# Patient Record
Sex: Male | Born: 1965
Health system: Southern US, Community
[De-identification: ages and names within clinical notes are randomized; demographics above are authoritative.]

## PROBLEM LIST (undated history)

## (undated) DIAGNOSIS — E785 Hyperlipidemia, unspecified: Secondary | ICD-10-CM

## (undated) DIAGNOSIS — R945 Abnormal results of liver function studies: Secondary | ICD-10-CM

## (undated) DIAGNOSIS — E119 Type 2 diabetes mellitus without complications: Secondary | ICD-10-CM

## (undated) DIAGNOSIS — I10 Essential (primary) hypertension: Secondary | ICD-10-CM

## (undated) HISTORY — DX: Abnormal results of liver function studies: R94.5

## (undated) HISTORY — DX: Type 2 diabetes mellitus without complications: E11.9

## (undated) HISTORY — DX: Hyperlipidemia, unspecified: E78.5

## (undated) HISTORY — DX: Essential (primary) hypertension: I10

---

## 2000-08-25 ENCOUNTER — Encounter: Admission: RE | Admit: 2000-08-25 | Discharge: 2000-08-25 | Payer: Self-pay | Admitting: Family Medicine

## 2000-08-30 ENCOUNTER — Encounter: Admission: RE | Admit: 2000-08-30 | Discharge: 2000-11-28 | Payer: Self-pay | Admitting: *Deleted

## 2000-09-09 ENCOUNTER — Encounter: Admission: RE | Admit: 2000-09-09 | Discharge: 2000-09-09 | Payer: Self-pay | Admitting: Family Medicine

## 2000-09-28 ENCOUNTER — Encounter: Admission: RE | Admit: 2000-09-28 | Discharge: 2000-09-28 | Payer: Self-pay | Admitting: Family Medicine

## 2001-04-18 ENCOUNTER — Encounter: Admission: RE | Admit: 2001-04-18 | Discharge: 2001-04-18 | Payer: Self-pay | Admitting: Family Medicine

## 2001-05-10 ENCOUNTER — Encounter: Admission: RE | Admit: 2001-05-10 | Discharge: 2001-05-10 | Payer: Self-pay | Admitting: Family Medicine

## 2001-06-08 ENCOUNTER — Encounter: Admission: RE | Admit: 2001-06-08 | Discharge: 2001-06-08 | Payer: Self-pay | Admitting: Family Medicine

## 2001-06-29 ENCOUNTER — Encounter: Admission: RE | Admit: 2001-06-29 | Discharge: 2001-06-29 | Payer: Self-pay | Admitting: Family Medicine

## 2001-07-14 ENCOUNTER — Encounter: Admission: RE | Admit: 2001-07-14 | Discharge: 2001-07-14 | Payer: Self-pay | Admitting: Family Medicine

## 2001-08-15 ENCOUNTER — Encounter: Admission: RE | Admit: 2001-08-15 | Discharge: 2001-08-15 | Payer: Self-pay | Admitting: Family Medicine

## 2001-09-20 ENCOUNTER — Encounter: Admission: RE | Admit: 2001-09-20 | Discharge: 2001-09-20 | Payer: Self-pay | Admitting: Family Medicine

## 2002-05-30 ENCOUNTER — Encounter: Admission: RE | Admit: 2002-05-30 | Discharge: 2002-05-30 | Payer: Self-pay | Admitting: Sports Medicine

## 2002-06-16 ENCOUNTER — Encounter: Admission: RE | Admit: 2002-06-16 | Discharge: 2002-06-16 | Payer: Self-pay | Admitting: Family Medicine

## 2002-11-17 ENCOUNTER — Encounter: Admission: RE | Admit: 2002-11-17 | Discharge: 2002-11-17 | Payer: Self-pay | Admitting: Family Medicine

## 2002-12-05 ENCOUNTER — Encounter: Admission: RE | Admit: 2002-12-05 | Discharge: 2002-12-05 | Payer: Self-pay

## 2002-12-07 ENCOUNTER — Encounter: Admission: RE | Admit: 2002-12-07 | Discharge: 2002-12-07 | Payer: Self-pay | Admitting: Family Medicine

## 2003-07-13 ENCOUNTER — Encounter: Admission: RE | Admit: 2003-07-13 | Discharge: 2003-07-13 | Payer: Self-pay | Admitting: Family Medicine

## 2003-11-21 ENCOUNTER — Encounter: Admission: RE | Admit: 2003-11-21 | Discharge: 2003-11-21 | Payer: Self-pay | Admitting: Family Medicine

## 2004-02-14 ENCOUNTER — Ambulatory Visit: Payer: Self-pay | Admitting: Family Medicine

## 2004-03-04 ENCOUNTER — Ambulatory Visit: Payer: Self-pay | Admitting: Family Medicine

## 2004-03-20 ENCOUNTER — Ambulatory Visit: Payer: Self-pay | Admitting: Family Medicine

## 2004-08-06 ENCOUNTER — Ambulatory Visit: Payer: Self-pay | Admitting: Family Medicine

## 2004-11-12 ENCOUNTER — Ambulatory Visit: Payer: Self-pay | Admitting: Family Medicine

## 2005-11-18 ENCOUNTER — Ambulatory Visit: Payer: Self-pay | Admitting: Family Medicine

## 2005-12-03 ENCOUNTER — Ambulatory Visit: Payer: Self-pay | Admitting: Family Medicine

## 2006-07-21 ENCOUNTER — Ambulatory Visit: Payer: Self-pay | Admitting: Family Medicine

## 2006-07-29 DIAGNOSIS — E1165 Type 2 diabetes mellitus with hyperglycemia: Secondary | ICD-10-CM

## 2006-07-29 DIAGNOSIS — F17200 Nicotine dependence, unspecified, uncomplicated: Secondary | ICD-10-CM

## 2006-07-29 DIAGNOSIS — G4700 Insomnia, unspecified: Secondary | ICD-10-CM

## 2006-07-29 DIAGNOSIS — E785 Hyperlipidemia, unspecified: Secondary | ICD-10-CM | POA: Insufficient documentation

## 2006-07-29 DIAGNOSIS — F528 Other sexual dysfunction not due to a substance or known physiological condition: Secondary | ICD-10-CM

## 2006-11-16 ENCOUNTER — Telehealth: Payer: Self-pay | Admitting: *Deleted

## 2006-12-02 ENCOUNTER — Encounter: Payer: Self-pay | Admitting: *Deleted

## 2006-12-24 ENCOUNTER — Telehealth: Payer: Self-pay | Admitting: *Deleted

## 2007-01-03 ENCOUNTER — Ambulatory Visit: Payer: Self-pay | Admitting: Family Medicine

## 2007-01-03 DIAGNOSIS — M549 Dorsalgia, unspecified: Secondary | ICD-10-CM | POA: Insufficient documentation

## 2007-10-31 ENCOUNTER — Emergency Department (HOSPITAL_COMMUNITY): Admission: EM | Admit: 2007-10-31 | Discharge: 2007-10-31 | Payer: Self-pay | Admitting: Emergency Medicine

## 2007-11-02 ENCOUNTER — Ambulatory Visit: Payer: Self-pay | Admitting: Family Medicine

## 2007-11-02 ENCOUNTER — Encounter (INDEPENDENT_AMBULATORY_CARE_PROVIDER_SITE_OTHER): Payer: Self-pay | Admitting: Family Medicine

## 2007-11-02 ENCOUNTER — Telehealth: Payer: Self-pay | Admitting: *Deleted

## 2007-11-03 ENCOUNTER — Encounter (INDEPENDENT_AMBULATORY_CARE_PROVIDER_SITE_OTHER): Payer: Self-pay | Admitting: Family Medicine

## 2007-11-16 ENCOUNTER — Telehealth (INDEPENDENT_AMBULATORY_CARE_PROVIDER_SITE_OTHER): Payer: Self-pay | Admitting: Family Medicine

## 2008-08-08 ENCOUNTER — Telehealth: Payer: Self-pay | Admitting: *Deleted

## 2008-09-18 ENCOUNTER — Telehealth: Payer: Self-pay | Admitting: *Deleted

## 2008-09-18 ENCOUNTER — Telehealth: Payer: Self-pay | Admitting: Family Medicine

## 2008-09-19 ENCOUNTER — Ambulatory Visit (HOSPITAL_COMMUNITY): Admission: RE | Admit: 2008-09-19 | Discharge: 2008-09-19 | Payer: Self-pay | Admitting: Family Medicine

## 2008-09-19 ENCOUNTER — Encounter: Payer: Self-pay | Admitting: Family Medicine

## 2008-09-19 ENCOUNTER — Ambulatory Visit: Payer: Self-pay | Admitting: Family Medicine

## 2008-09-19 DIAGNOSIS — R000 Tachycardia, unspecified: Secondary | ICD-10-CM

## 2008-09-19 LAB — CONVERTED CEMR LAB
Albumin: 5 g/dL (ref 3.5–5.2)
BUN: 15 mg/dL (ref 6–23)
Calcium: 10.1 mg/dL (ref 8.4–10.5)
Chloride: 103 meq/L (ref 96–112)
Direct LDL: 117 mg/dL — ABNORMAL HIGH
Glucose, Bld: 116 mg/dL — ABNORMAL HIGH (ref 70–99)
Hemoglobin: 15.8 g/dL (ref 13.0–17.0)
Hgb A1c MFr Bld: 9.8 %
Potassium: 4.1 meq/L (ref 3.5–5.3)
RBC: 5.08 M/uL (ref 4.22–5.81)

## 2008-09-21 ENCOUNTER — Encounter: Payer: Self-pay | Admitting: Family Medicine

## 2008-10-02 ENCOUNTER — Encounter: Payer: Self-pay | Admitting: Family Medicine

## 2009-03-01 ENCOUNTER — Telehealth: Payer: Self-pay | Admitting: Family Medicine

## 2009-07-03 ENCOUNTER — Ambulatory Visit: Payer: Self-pay | Admitting: Family Medicine

## 2009-07-03 ENCOUNTER — Encounter: Payer: Self-pay | Admitting: Family Medicine

## 2009-07-03 LAB — CONVERTED CEMR LAB: Hgb A1c MFr Bld: 10.5 %

## 2009-07-04 LAB — CONVERTED CEMR LAB
ALT: 16 units/L (ref 0–53)
AST: 18 units/L (ref 0–37)
Creatinine, Ser: 0.77 mg/dL (ref 0.40–1.50)
Direct LDL: 134 mg/dL — ABNORMAL HIGH
Total Bilirubin: 0.4 mg/dL (ref 0.3–1.2)

## 2009-07-11 ENCOUNTER — Ambulatory Visit: Payer: Self-pay | Admitting: Family Medicine

## 2009-07-11 ENCOUNTER — Encounter: Payer: Self-pay | Admitting: Family Medicine

## 2009-07-11 ENCOUNTER — Encounter (INDEPENDENT_AMBULATORY_CARE_PROVIDER_SITE_OTHER): Payer: Self-pay | Admitting: *Deleted

## 2009-07-19 ENCOUNTER — Ambulatory Visit: Payer: Self-pay | Admitting: Family Medicine

## 2009-08-28 ENCOUNTER — Telehealth: Payer: Self-pay | Admitting: Family Medicine

## 2009-08-30 ENCOUNTER — Ambulatory Visit: Payer: Self-pay | Admitting: Family Medicine

## 2009-11-11 ENCOUNTER — Encounter: Payer: Self-pay | Admitting: Family Medicine

## 2010-01-07 ENCOUNTER — Encounter: Payer: Self-pay | Admitting: Family Medicine

## 2010-01-07 ENCOUNTER — Ambulatory Visit: Payer: Self-pay | Admitting: Family Medicine

## 2010-01-07 ENCOUNTER — Ambulatory Visit (HOSPITAL_COMMUNITY): Admission: RE | Admit: 2010-01-07 | Discharge: 2010-01-07 | Payer: Self-pay | Admitting: Family Medicine

## 2010-01-07 DIAGNOSIS — G43009 Migraine without aura, not intractable, without status migrainosus: Secondary | ICD-10-CM | POA: Insufficient documentation

## 2010-01-09 LAB — CONVERTED CEMR LAB
Free T4: 1.23 ng/dL (ref 0.80–1.80)
T3, Free: 2.2 pg/mL — ABNORMAL LOW (ref 2.3–4.2)

## 2010-02-05 ENCOUNTER — Telehealth: Payer: Self-pay | Admitting: Family Medicine

## 2010-02-13 ENCOUNTER — Telehealth: Payer: Self-pay | Admitting: *Deleted

## 2010-03-18 ENCOUNTER — Encounter: Payer: Self-pay | Admitting: Family Medicine

## 2010-03-18 ENCOUNTER — Telehealth: Payer: Self-pay | Admitting: *Deleted

## 2010-03-18 ENCOUNTER — Ambulatory Visit: Payer: Self-pay | Admitting: Family Medicine

## 2010-03-18 DIAGNOSIS — K5289 Other specified noninfective gastroenteritis and colitis: Secondary | ICD-10-CM

## 2010-04-10 ENCOUNTER — Telehealth: Payer: Self-pay | Admitting: Family Medicine

## 2010-04-23 ENCOUNTER — Ambulatory Visit: Payer: Self-pay | Admitting: Family Medicine

## 2010-06-29 LAB — CONVERTED CEMR LAB: Hgb A1c MFr Bld: 11.1 %

## 2010-07-01 NOTE — Assessment & Plan Note (Signed)
Summary: CPE and needs RX for Viarga/ls   Vital Signs:  Patient profile:   45 year old male Height:      72 inches Weight:      175.2 pounds BMI:     23.85 Temp:     98.6 degrees F oral Pulse rate:   92 / minute BP sitting:   164 / 104  (left arm) Cuff size:   regular  Vitals Entered By: Garen Grams LPN (April 23, 2010 3:56 PM) CC: cpe Is Patient Diabetic? Yes Did you bring your meter with you today? No Pain Assessment Patient in pain? no        Primary Provider:  . [facility]  CC:  cpe.  History of Present Illness: Mr. Erven presents for his CPE.  He has no major complaints except that he has a new girlfriend and has been having trouble with erections when they are intimate.   He says his blood sugars run in the low 100's in the mornings, and he is taking his medications as perscribed.   Pt. smokes a few black and mild cigars a day.  He works as an Audiological scientist, which also includes moving appliances, from which he feels he gets plenty of exercise.   Pt. denies having issues with blood pressures in the past, and says it is high today because of road rage.   Preventive Screening-Counseling & Management  Alcohol-Tobacco     Smoking Status: current     Smoking Cessation Counseling: yes     Cigars/week: 3     Tobacco Counseling: to quit use of tobacco products  Current Medications (verified): 1)  Bayer Childrens Aspirin 81 Mg Chew (Aspirin) .... Take 1 Tablet By Mouth Once A Day 2)  Lantus 100 Unit/ml Soln (Insulin Glargine) .... Inject 50 Unit Subcutaneously At Bedtime; Disp Qs For One Month 3)  Accu-Chek Active  Strp (Glucose Blood) .... Use Twice A Day - Once Before Breakfast and Once Before Bed; May Substitute Any Acceptable Brand. Dispense 2 Month Supply 4)  Bd Insulin Syringe Ultrafine 29g X 1/2" 1 Ml Misc (Insulin Syringe-Needle U-100) .... Disp Qs For One Month 5)  Lipitor 40 Mg Tabs (Atorvastatin Calcium) .... One By Mouth Daily For  Cholesterol 6)  Humalog Kwikpen 100 Unit/ml Soln (Insulin Lispro (Human)) .... Use 1 Unit On Insulin For Every 50 Greater Than 300. 7)  Viagra 100 Mg Tabs (Sildenafil Citrate) .... Take 1/2 Tab To One Tab By Mouth X1 Prior To Sexual Activity.  Allergies: No Known Drug Allergies  Family History: HTN, Mother -  half-sister died of a `syndrome` 1 sister - healthy 3 brothers -healthy Father died at 23 from CHF, also had DM2 and HTN.   No family history of DVTs or PEs.   Physical Exam  General:  Well-developed,well-nourished,in no acute distress; alert,appropriate and cooperative throughout examination Head:  Normocephalic and atraumatic without obvious abnormalities. No apparent alopecia or balding. Eyes:  No corneal or conjunctival inflammation noted. EOMI. Perrla. Funduscopic exam benign, without hemorrhages, exudates or papilledema. Vision grossly normal. Nose:  External nasal examination shows no deformity or inflammation. Nasal mucosa are pink and moist without lesions or exudates. Mouth:  Oral mucosa and oropharynx without lesions or exudates.  Teeth in good repair. Neck:  No deformities, masses, or tenderness noted. Lungs:  Normal respiratory effort, chest expands symmetrically. Lungs are clear to auscultation, no crackles or wheezes. Heart:  Normal rate and regular rhythm. S1 and S2 normal without gallop, murmur, click,  rub or other extra sounds. Abdomen:  Bowel sounds positive,abdomen soft and non-tender without masses, organomegaly or hernias noted. Pulses:  R and L carotid,radial,dorsalis pedis and posterior tibial pulses are full and equal bilaterally Extremities:  No clubbing, cyanosis, edema, or deformity noted with normal full range of motion of all joints.     Impression & Recommendations:  Problem # 1:  HEALTH MAINTENANCE EXAM (ICD-V70.0) Pt. still with poorly controlled chronic medical problems as discussed below.  He denies any previous problems with blood pressure,  but it was elevated when he arrived in clinic this afternoon, as well as when it was rechecked just before he left.  I will have him come in for nurse visits for blood pressure checks every two weeks for the next six weeks.    Problem # 2:  TOBACCO DEPENDENCE (ICD-305.1) Pt. currently unwilling to quit smoking, as he feels he does not smoke very much.  counselled that he should seriously consider quitting as he is already at high risk for heart attack and stroke due to diabetes.  Orders: [facility] - Est  40-64 yrs ([address])  Problem # 3:  DIABETES MELLITUS II, UNCOMPLICATED (ICD-250.00) Improved, but still poorly controlled.  Pt. says it has improved because he has been eating healthier because of his girlfriend.  Will receck A1c next visit to determine if medication changes still need to be made.  His updated medication list for this problem includes:    Bayer Childrens Aspirin 81 Mg Chew (Aspirin) .Marland Kitchen... Take 1 tablet by mouth once a day    Lantus 100 Unit/ml Soln (Insulin glargine) ..... Inject 50 unit subcutaneously at bedtime; disp qs for one month    Humalog Kwikpen 100 Unit/ml Soln (Insulin lispro (human)) ..... Use 1 unit on insulin for every 50 greater than 300.  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 has had issues with ED in the past, and Viagra seemed to help him.  Will re-start this medication today.  His updated medication list for this problem includes:    Viagra 100 Mg Tabs (Sildenafil citrate) .Marland Kitchen... Take 1/2 tab to one tab by mouth x1 prior to sexual activity.  Orders: [facility] - Est  40-64 yrs ([address])  Complete Medication List: 1)  Bayer Childrens Aspirin 81 Mg Chew (Aspirin) .... Take 1 tablet by mouth once a day 2)  Lantus 100 Unit/ml Soln (Insulin glargine) .... Inject 50 unit subcutaneously at bedtime; disp qs for one month 3)  Accu-chek Active Strp (Glucose blood) .... Use twice a day - once before breakfast and  once before bed; may substitute any acceptable brand. dispense 2 month supply 4)  Bd Insulin Syringe Ultrafine 29g X 1/2" 1 Ml Misc (Insulin syringe-needle u-100) .... Disp qs for one month 5)  Lipitor 40 Mg Tabs (Atorvastatin calcium) .... One by mouth daily for cholesterol 6)  Humalog Kwikpen 100 Unit/ml Soln (Insulin lispro (human)) .... Use 1 unit on insulin for every 50 greater than 300. 7)  Viagra 100 Mg Tabs (Sildenafil citrate) .... Take 1/2 tab to one tab by mouth x1 prior to sexual activity.  Other Orders: Future Orders: Lipid-[facility] ([id number]) ... 04/24/2011  Patient Instructions: 1)  It was nice to meet you today. 2)  Please continue to watch your diet, exercise, and give your insulin as perscribed.   3)  Please think about quitting smoking- it is one of the best health choices you can make. 4)  Watch your caloric intake over the Holidays. 5)  Please make a lab appointment to have you cholesterol checked, first thing in the morning before you eat anything.  Prescriptions: VIAGRA 100 MG TABS (SILDENAFIL CITRATE) take 1/2 tab to one tab by mouth x1 prior to sexual activity.  #10 x 3   Entered and Authorized by:   Ardyth Gal MD   Signed by:   Ardyth Gal MD on 04/23/2010   Method used:   Print then Give to Patient   RxID:   [id number] VIAGRA 100 MG TABS (SILDENAFIL CITRATE) take 1/2 tab to one tab by mouth x1 prior to sexual activity.  #10 x 3   Entered and Authorized by:   Ardyth Gal MD   Signed by:   Ardyth Gal MD on 04/23/2010   Method used:   Print then Give to Patient   RxID:   [id number]    Orders Added: 1)  A1C-[facility] [83036] 2)  Lipid-[facility] [[id number]] 3)  [facility] - Est  40-64 yrs [[address]396]    Laboratory Results   Blood Tests   Date/Time Received: April 23, 2010 3:50 PM  Date/Time Reported: April 23, 2010 4:24 PM   HGBA1C: 9.7%   (Normal Range: Non-Diabetic - 3-6%   Control Diabetic - 6-8%)   Comments: ...............test performed by......Marland KitchenBonnie A. [place], MLS (ASCP)cm       Prevention & Chronic Care Immunizations   Influenza vaccine: Not documented    Tetanus booster: 06/01/1997: Done.   Tetanus booster due: 06/02/2007    Pneumococcal vaccine: Not documented      Screening comments: Pt. not ready to quit.   Diabetes Mellitus   HgbA1C: 9.7  (04/23/2010)   Hemoglobin A1C due: 02/02/2008    Eye exam: Not documented    Foot exam: Not documented   High risk foot: Not documented   Foot care education: Not documented    Urine microalbumin/creatinine ratio: Not documented    Diabetes flowsheet reviewed?: Yes   Progress toward A1C goal: Improved    Stage of readiness to change (diabetes management): Contemplation  Lipids   Total Cholesterol: Not documented   LDL: Not documented   LDL Direct: 134  (07/03/2009)   HDL: Not documented   Triglycerides: Not documented    SGOT (AST): 18  (07/03/2009)   SGPT (ALT): 16  (07/03/2009)   Alkaline phosphatase: 83  (07/03/2009)   Total bilirubin: 0.4  (07/03/2009)    Lipid flowsheet reviewed?: Yes   Progress toward LDL goal: Unchanged    Stage of readiness to change (lipid management): Contemplation  Self-Management Support :   Personal Goals (by the next clinic visit) :     Personal A1C goal: 8  (07/03/2009)     Personal blood pressure goal: 130/80  (07/03/2009)     Personal LDL goal: 100  (07/03/2009)    Patient will work on the following items until the next clinic visit to reach self-care goals:     Medications and monitoring: take my medicines every day  (04/23/2010)     Eating: drink diet soda or water instead of juice or soda  (04/23/2010)     Activity: take a 30 minute walk every day  (04/23/2010)    Diabetes self-management support: Copy of home glucose meter record  (04/23/2010)    Lipid self-management support: Written self-care plan  (07/03/2009)

## 2010-07-01 NOTE — Miscellaneous (Signed)
Summary: Informed Consent for Medical Procedure  Informed Consent for Medical Procedure   Imported By: Knox Royalty 07/25/2009 08:44:10  _____________________________________________________________________  External Attachment:    Type:   Image     Comment:   External Document

## 2010-07-01 NOTE — Assessment & Plan Note (Signed)
Summary: REMOVE CYST/KH   Vital Signs:  Patient profile:   45 year old male Height:      72 inches Weight:      174.0 pounds BMI:     23.68 Temp:     98.2 degrees F oral Pulse rate:   116 / minute BP sitting:   136 / 84  (right arm) Cuff size:   regular  Vitals Entered By: Gladstone Pih (July 11, 2009 9:16 AM) CC: Cyst removed under chin Is Patient Diabetic? Yes Did you bring your meter with you today? No Pain Assessment Patient in pain? no        Primary Care Provider:  Myrtie Soman  MD  CC:  Cyst removed under chin.  History of Present Illness: lump under chin for several months 94-6). Started as an ingrown hair. No drainage. Hee for removal /further eval  Habits & Providers  Alcohol-Tobacco-Diet     Tobacco Status: current     Tobacco Counseling: to quit use of tobacco products     Other Tobacco cigar     Other per week 14  Allergies: No Known Drug Allergies  Physical Exam  Neck:  large 2.5 cm diameter, mobile, very soft subcutaneous mass under chin in hair bearing area. no drainage. no pain, a little tender. Additional Exam:  Informed consent given and signed copy in chart. Appropraite time out taken. Area prepped and draped in usual sterile fashion. Local anasthesia with 2% lidocaine with epi (8 cc). Small 1 cm incision made horizontally across center of mass.  Blunt and some sharp dissection used to isolate sac of apparent sebacous cyst. The sca was partially ruptured but we were able to remove what appeared to be the entire sac. typical sebacous materila oozed from the puncture site. Incision was closed with two 3-0 vicryl sutures placed deep and 4 interrupted sutures using 4-0 ethilon. Area was cleaned and topical abx ointment applied. No complications. EBL < 5 cc. Given suture care instructions.    Impression & Recommendations:  Problem # 1:  SEBACEOUS CYST, NECK (ICD-706.2)  rtc 1 week for suture removal  Orders: Provider Misc Charge- [facility]  (Misc)  Complete Medication List: 1)  Bayer Childrens Aspirin 81 Mg Chew (Aspirin) .... Take 1 tablet by mouth once a day 2)  Lantus 100 Unit/ml Soln (Insulin glargine) .... Inject 56 unit subcutaneously at bedtime; disp qs for one month 3)  Accu-chek Active Strp (Glucose blood) .... Use twice a day - once before breakfast and once before bed; may substitute any acceptable brand. dispense 2 month supply 4)  Tramadol Hcl 50 Mg Tabs (Tramadol hcl) .Marland Kitchen.. 1-2 tabs by mouth every 4-6 hours as needed 5)  Flexeril 10 Mg Tabs (Cyclobenzaprine hcl) .... One by mouth three times a day as needed for spasm 6)  Bd Insulin Syringe Ultrafine 29g X 1/2" 1 Ml Misc (Insulin syringe-needle u-100) .... Disp qs for one month 7)  Lipitor 40 Mg Tabs (Atorvastatin calcium) .... One by mouth daily for cholesterol

## 2010-07-01 NOTE — Assessment & Plan Note (Signed)
Summary: f/up & leg/back pain,tcb   Vital Signs:  Patient profile:   45 year old male Height:      72 inches Weight:      175.6 pounds BMI:     23.90 Temp:     98.3 degrees F oral Pulse rate:   91 / minute BP sitting:   143 / 94  (left arm) Cuff size:   regular  Vitals Entered By: Garen Grams LPN (July 03, 2009 11:03 AM) CC: f/u Is Patient Diabetic? Yes Did you bring your meter with you today? No Pain Assessment Patient in pain? yes     Location: back Intensity: 3   Primary Care Provider:  Myrtie Soman  MD  CC:  f/u.  History of Present Illness: Pt with poorly controlled diabetes -- has not been seen since 4/10.   1. low back pain Has been going on for about 1 month. Has a dull ache constantly. Worse with certain movements like sustained stooping or lifting objects. Worse in the morning but eventually looses up. Aleve helps - takes the edge off. Takes 2 pills once or twice per day.  Some radiation down the legs into the calves - equal on both sides  ROS: denies any bowel or bladder problems; no groin numbness; no weakness or problems walking. No polyuria or polydipsia  2. ingrown hair on chin has been going on for 3 months. Gets bigger and smaller periodically. Has not been able to pop it, which he usually does. Sometimes gets red and pain. Is big now, but not red or painful.   3. diabetes  Pt does not bring this up as an issue. sugars are very erratic. A1c is 10.5 today. Take 56 units of lantus daily. States that he checks his sugars about 3 times per day. Does not have a log of his sugars. Reports a lot of stress lately, son recently put in jail. Just got divorced from his wife.    Habits & Providers  Alcohol-Tobacco-Diet     Tobacco Status: current  Current Medications (verified): 1)  Bayer Childrens Aspirin 81 Mg Chew (Aspirin) .... Take 1 Tablet By Mouth Once A Day 2)  Lantus 100 Unit/ml Soln (Insulin Glargine) .... Inject 56 Unit Subcutaneously At  Bedtime 3)  Accu-Chek Active  Strp (Glucose Blood) .... Use Twice A Day - Once Before Breakfast and Once Before Bed; May Substitute Any Acceptable Brand. Dispense 2 Month Supply 4)  Tramadol Hcl 50 Mg Tabs (Tramadol Hcl) .Marland Kitchen.. 1-2 Tabs By Mouth Every 4-6 Hours As Needed 5)  Flexeril 10 Mg Tabs (Cyclobenzaprine Hcl) .... One By Mouth Three Times A Day As Needed For Spasm  Allergies (verified): No Known Drug Allergies  Past History:  Past Medical History: noncompliant with diabetes diet HLD diabetes type 2 - poorly controlled tobacco abuse  Family History: HTN, Mother -  half-sister died of a `syndrome` 1 sister - healthy 3 brothers -healthy Father died at 67 from CHF, also had DM2  No family history of DVTs or PEs.   Social History: recently divorced; son Thayer Ohm) recently put in jail (3.5 years)); 3 kids (Levon, 11; [place], 21; Renee, 22).; Works as Audiological scientist; smokes 2-3 cigars daily; occ ETOH; occasional THC use.   Physical Exam  Additional Exam:  General:  Vital signs reviewed -- hypertensive Alert, appropriate; well-dressed and well-nourished Lungs:  work of breathing unlabored, clear to auscultation bilaterally; no wheezes, rales, or ronchi; good air movement throughout Heart:  regular rate and  rhythm, no murmurs; normal s1/s2 MSK: back exam: normal to inspection; no bony point tenderness; soem paraspinous muscle tenderness at around T11; mildly painful lateral flexion, normal forward flexion, normal extension; negative SLR bilaterally. Negative L'hermitte sign. DTRs 1+ bilaterally and symmetric. Sensation normal bilateral legs.   Pulses:  DP and radial pulses 2+ bilaterally  Extremities:  no cyanosis, clubbing, or edema Neurologic:  alert and oriented. speech normal.    Impression & Recommendations:  Problem # 1:  BACK PAIN (ICD-724.5) likely muscle strain related to job. No red flags. Flexeril and tramadol for now. Would avoid further NSAIDs given BP. Heat  and stretching exercises.   The following medications were removed from the medication list:    Flexeril 10 Mg Tabs (Cyclobenzaprine hcl) .Marland Kitchen... Take one tab by mouth at bedtime His updated medication list for this problem includes:    Bayer Childrens Aspirin 81 Mg Chew (Aspirin) .Marland Kitchen... Take 1 tablet by mouth once a day    Tramadol Hcl 50 Mg Tabs (Tramadol hcl) .Marland Kitchen... 1-2 tabs by mouth every 4-6 hours as needed    Flexeril 10 Mg Tabs (Cyclobenzaprine hcl) ..... One by mouth three times a day as needed for spasm  Problem # 2:  SEBACEOUS CYST, NECK (ICD-706.2) Assessment: New Not inflamed. Have back in procedures clinic for removal.   Problem # 3:  DIABETES MELLITUS II, UNCOMPLICATED (ICD-250.00) Assessment: Deteriorated  A1c worse. Stressed the importance of taking this seriously. Have back soon in pharmacy clinic to discuss further. Pt open to starting mealtime insulin. Asked him to keep a log of his sugars for review at pharmacy clinic. Check CMP and LDL today. Would benefit from ACEi.   The following medications were removed from the medication list:    Humalog Kwikpen 100 Unit/ml Soln (Insulin lispro (human)) .Marland Kitchen... Take as directed His updated medication list for this problem includes:    Bayer Childrens Aspirin 81 Mg Chew (Aspirin) .Marland Kitchen... Take 1 tablet by mouth once a day    Lantus 100 Unit/ml Soln (Insulin glargine) ..... Inject 56 unit subcutaneously at bedtime  Orders: A1C-[facility] ([address]109) Comp Met-[facility] ([id number]) Direct LDL-[facility] (91478-29562)  Complete Medication List: 1)  Bayer Childrens Aspirin 81 Mg Chew (Aspirin) .... Take 1 tablet by mouth once a day 2)  Lantus 100 Unit/ml Soln (Insulin glargine) .... Inject 56 unit subcutaneously at bedtime 3)  Accu-chek Active Strp (Glucose blood) .... Use twice a day - once before breakfast and once before bed; may substitute any acceptable brand. dispense 2 month supply 4)  Tramadol Hcl 50 Mg Tabs (Tramadol hcl) .Marland Kitchen.. 1-2 tabs by mouth every  4-6 hours as needed 5)  Flexeril 10 Mg Tabs (Cyclobenzaprine hcl) .... One by mouth three times a day as needed for spasm  Patient Instructions: 1)  follow-up in our procedures clinic to have the cyst removed from your chin. 2)  follow-up in our diabetes pharmacy clinic in the next month 3)  follow-up with me in 2-3 months 4)  use heat 2-3 times a day for 20 minutes 5)  take the flexeril three times a day as needed - it may make you sleepy. 6)  use the tramadol every 4-6 hours 7)  do the back exercises 8)  we need to get serious about your blood sugars 9)  Start keeping a log of sugars.  Prescriptions: FLEXERIL 10 MG TABS (CYCLOBENZAPRINE HCL) one by mouth three times a day as needed for spasm  #60 x 0   Entered and Authorized by:  Myrtie Soman  MD   Signed by:   Myrtie Soman  MD on 07/03/2009   Method used:   Electronically to        [facility] #[id number]* (retail)       [address] [place], [place]  [address]       Ph: [phone]       Fax: [phone]   RxID:   [id number] TRAMADOL HCL 50 MG TABS (TRAMADOL HCL) 1-2 tabs by mouth every 4-6 hours as needed  #60 x 1   Entered and Authorized by:   Myrtie Soman  MD   Signed by:   Myrtie Soman  MD on 07/03/2009   Method used:   Electronically to        [facility] #[id number]* (retail)       [address] [place], [place]  [address]       Ph: [phone]       Fax: [phone]   RxID:   [id number]   Laboratory Results   Blood Tests   Date/Time Received: July 03, 2009 11:04 AM  Date/Time Reported: July 03, 2009 11:24 AM   HGBA1C: 10.5%   (Normal Range: Non-Diabetic - 3-6%   Control Diabetic - 6-8%)  Comments: ...............test performed by......Marland KitchenBonnie A. [place], MLS (ASCP)cm      Prevention & Chronic Care Immunizations   Influenza vaccine: Not documented    Tetanus booster: 06/01/1997: Done.   Tetanus booster due: 06/02/2007    Pneumococcal vaccine: Not  documented  Other Screening   PSA: Not documented   Smoking status: current  (07/03/2009)   Smoking cessation counseling: yes  (11/02/2007)  Diabetes Mellitus   HgbA1C: 10.5  (07/03/2009)   Hemoglobin A1C due: 02/02/2008    Eye exam: Not documented    Foot exam: Not documented   High risk foot: Not documented   Foot care education: Not documented    Urine microalbumin/creatinine ratio: Not documented  Lipids   Total Cholesterol: Not documented   LDL: Not documented   LDL Direct: 117  (09/19/2008)   HDL: Not documented   Triglycerides: Not documented    SGOT (AST): 19  (09/19/2008)   SGPT (ALT): 18  (09/19/2008) CMP ordered    Alkaline phosphatase: 69  (09/19/2008)   Total bilirubin: 0.7  (09/19/2008)  Self-Management Support :   Personal Goals (by the next clinic visit) :     Personal A1C goal: 8  (07/03/2009)     Personal blood pressure goal: 130/80  (07/03/2009)     Personal LDL goal: 100  (07/03/2009)    Diabetes self-management support: Written self-care plan  (07/03/2009)   Diabetes care plan printed    Lipid self-management support: Written self-care plan  (07/03/2009)   Lipid self-care plan printed.  Appended Document: f/up & leg/back pain,tcb    Clinical Lists Changes  Medications: Added new medication of BD INSULIN SYRINGE ULTRAFINE 29G X 1/2" 1 ML MISC (INSULIN SYRINGE-NEEDLE U-100) Disp QS for one month - Signed Changed medication from LANTUS 100 UNIT/ML SOLN (INSULIN GLARGINE) Inject 56 unit subcutaneously at bedtime to LANTUS 100 UNIT/ML SOLN (INSULIN GLARGINE) Inject 56 unit subcutaneously at bedtime; disp QS for one month - Signed Rx of BD INSULIN SYRINGE ULTRAFINE 29G X 1/2" 1 ML MISC (INSULIN SYRINGE-NEEDLE U-100) Disp QS for one month;  #1 x 11;  Signed;  Entered by: Myrtie Soman  MD;  Authorized  by: Myrtie Soman  MD;  Method used: Electronically to [facility] #[id number]*, [address] [place], [place]  [address], Ph: [phone],  Fax: [phone] Rx of LANTUS 100 UNIT/ML SOLN (INSULIN GLARGINE) Inject 56 unit subcutaneously at bedtime; disp QS for one month;  #1 x 3;  Signed;  Entered by: Myrtie Soman  MD;  Authorized by: Myrtie Soman  MD;  Method used: Electronically to [facility] #[id number]*, [address] [place], [place]  [address], Ph: [phone], Fax: [phone]    Prescriptions: LANTUS 100 UNIT/ML SOLN (INSULIN GLARGINE) Inject 56 unit subcutaneously at bedtime; disp QS for one month  #1 x 3   Entered and Authorized by:   Myrtie Soman  MD   Signed by:   Myrtie Soman  MD on 07/03/2009   Method used:   Electronically to        [facility] #[id number]* (retail)       [address] [place], [place]  [address]       Ph: [phone]       Fax: [phone]   RxID:   [id number] BD INSULIN SYRINGE ULTRAFINE 29G X 1/2" 1 ML MISC (INSULIN SYRINGE-NEEDLE U-100) Disp QS for one month  #1 x 11   Entered and Authorized by:   Myrtie Soman  MD   Signed by:   Myrtie Soman  MD on 07/03/2009   Method used:   Electronically to        [facility] #[id number]* (retail)       [address] [place], [place]  [address]       Ph: [phone]       Fax: [phone]   RxID:   [id number]    Appended Document: Orders Update    Clinical Lists Changes  Orders: Added new Test order of [facility]- Est  Level 4 ([address]) - Signed

## 2010-07-01 NOTE — Assessment & Plan Note (Signed)
Summary: migraine,tcb   Vital Signs:  Patient profile:   45 year old male Weight:      158.2 pounds Temp:     98.6 degrees F Pulse rate:   128 / minute Pulse rhythm:   regular BP sitting:   135 / 92  (left arm) Cuff size:   regular  Vitals Entered By: Loralee Pacas CMA (January 07, 2010 2:28 PM) CC: migraine   Primary Care Provider:  . [facility]  CC:  migraine.  History of Present Illness: Migraine:  Patient developed headache starting this Sunday.  Describes as beginning in occipital region, then radiating to left parietal and frontal areas.  Had very stressful day at work on Friday, worst day he can remember.  Developed nausea and vomited x 1 later that Sunday, continued with N/V on Monday along with blurry vision.  Has tried Alleve and Tramadol without relief.  Today, states that he is only experiencing the "ghost" of his pain, much improved.  Still with some nausea, no vomiting.  Little appetite.  ROS:  15 to 20 lb weight loss for past 3-4 months, night sweats.  No muscle aches, fevers, chills, cough, shortness of breath, or chest pain.  No polydipsia, polyuria, polyphagia.  Does report CBGs at home being higher than usual, around 200-300 even with him not eating.    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 Take 1 Tablet By Mouth Once A Day 2)  Lantus 100 Unit/ml Soln (Insulin Glargine) .... Inject 58 Unit Subcutaneously At Bedtime; Disp Qs For One Month 3)  Accu-Chek Active  Strp  (Glucose Blood) .... Use Twice A Day - Once Before Breakfast and Once Before Bed; May Substitute Any Acceptable Brand. Dispense 2 Month Supply 4)  Bd Insulin Syringe Ultrafine 29g X 1/2" 1 Ml Misc (Insulin Syringe-Needle U-100) .... Disp Qs For One Month 5)  Lipitor 40 Mg Tabs (Atorvastatin Calcium) .... One By Mouth Daily For Cholesterol 6)  Humalog Kwikpen 100 Unit/ml Soln (Insulin Lispro (Human)) .... Use 1 Unit On Insulin For Every 50 Greater Than 300.  Allergies (verified): No Known Drug Allergies  Past History:  Past medical, surgical, family and social histories (including risk factors) reviewed, and no changes noted (except as noted below).  Past Medical History: Reviewed history from 07/03/2009 and no changes required. noncompliant with diabetes diet HLD diabetes type 2 - poorly controlled tobacco abuse  Past Surgical History: Reviewed history from 07/29/2006 and no changes required. CMP wnl (cr=1.0) except glu 138 - 11/13/2004, TSH 1.045, lipase <10 - 11/13/2004  Family History: Reviewed history from 07/03/2009 and no changes required. HTN, Mother -  half-sister died of a `syndrome` 1 sister - healthy 3 brothers -healthy Father died at 46 from CHF, also had DM2  No family history of DVTs or PEs.   Social History: Reviewed history from 07/03/2009 and no changes required. recently divorced; son Thayer Ohm) recently put in jail (3.5 years)); 3 kids (Levon, 29; [place], [place]; Renee, 22).; Works as Audiological scientist;  smokes 2-3 cigars daily; occ ETOH; occasional THC use.   Physical Exam  General:  alert, well-developed, well-nourished, and well-hydrated.  maildly underweight for his height. Vital signs reviewed, see A/P regarding pulse.   Eyes:  some proptosis noted bilaterally and equal both eyes Mouth:  mucus membranes well-hydrated. does have small "defect" in soft palate, no erythema Lungs:  clear to auscultation bilaterally without wheezing, rales, or rhonchi.  Normal  work of breathing  Heart:  Tachycardic, regular rhythm without murmur, rub, or gallop.  Normal S1/S2  Abdomen:  soft/nondistended/nontender.  Bowel sounds present and normoactive.  No organomegaly noted.   Pulses:  rapid regular pulse   Impression & Recommendations:  Problem # 1:  COMMON MIGRAINE (ICD-346.10) Patient does not have history of migraines.  States he may have had one when he was a child but none since then.  Possibility that this is new-onset of migraine disorder.  Will not treat for abortive or prophylactic therapy as this is first occurence and patient may not have any further migraines.  Other possibilties are that he is suffering from hyperthyroidism, see below, though he complains of anorexia rather than increasing hunger.  Elevated blood sugars could also be the cause of his headache, however this could also be an effect of stress brought on by headache.  Plan to call patient with TSH results tomorrow, will develop follow-up plan based on those results.  Discussed Phenergan with him, but he states he thinks he'll be okay because he now feels hungry and that nausea is resolving.  Will not send prescription now, wait for phone call tomorrow.   His updated medication list for this problem includes:    Bayer Childrens Aspirin 81 Mg Chew (Aspirin) .Marland Kitchen... Take 1 tablet by mouth once a day  Orders: [facility]- Est Level  3 (32951)  Problem # 2:  UNSPECIFIED TACHYCARDIA (ICD-785.0) Assessment: New Obtained EKG, which showed sinus tachycardia.  Also some nonspecific changes of possible bilateral atrial enlargement.  Patient will need follow up EKG once tachycardia has resolved.  Of note, patient admits to drinking Monster energy drink 10 minutes before coming back for appointment.  Most likely reason for acute tachycardia is energy drink consumption as well as mild dehydration.  Will check TSH, Free T3, Free T4 due to symptoms of weight loss, occasional night sweats, and sign of proptosis.  Of  note, patient has been diagnosed with uspecified tachycardia in the past.   Orders: TSH-[facility] [phone]) Free T3-[facility] [phone]) Free T4-[facility] [phone])  Complete Medication List: 1)  Bayer Childrens Aspirin 81 Mg Chew (Aspirin) .... Take 1 tablet by mouth once a day 2)  Lantus 100 Unit/ml Soln (Insulin glargine) .... Inject 58 unit subcutaneously at bedtime; disp qs for one month 3)  Accu-chek Active Strp (Glucose blood) .... Use twice a day - once before breakfast and once before bed; may substitute any acceptable brand. dispense 2 month supply 4)  Bd Insulin Syringe Ultrafine 29g X 1/2" 1 Ml Misc (Insulin syringe-needle u-100) .... Disp qs for one month 5)  Lipitor 40 Mg Tabs (Atorvastatin calcium) .... One by mouth daily for cholesterol 6)  Humalog Kwikpen 100 Unit/ml Soln (Insulin lispro (human)) .... Use 1 unit on insulin for every 50 greater than 300.  Other Orders: A1C-[facility] (70623) Glucose Cap-[facility] [phone])  Laboratory Results   Blood Tests   Date/Time Received: January 07, 2010 2:29 PM  Date/Time Reported: January 07, 2010 3:49 PM   HGBA1C: 11.1%   (Normal Range:  Non-Diabetic - 3-6%   Control Diabetic - 6-8%)  Comments: ...............test performed by......Marland KitchenBonnie A. [place], MLS (ASCP)cm

## 2010-07-01 NOTE — Progress Notes (Signed)
  Phone Note Call from Patient   Caller: Patient Summary of Call: Pt didn't take dm meds due to b/s running low yesterday.  This am was 48.  Taken a few minutes ago and it was at 45.  Question whether his pancreas is functioning properly.  Haven't taken insulin and is concerned that blood sugar is still at lower level than normal.  Please call [phone] Initial call taken by: Britta Mccreedy mcgregor  Follow-up for Phone Call        he just drank OJ & is feeling better now. took no insulin last night and it "bottoms out in the middle of the night"  does not eat breakfast. had a pastry today. likes meat but recall was deficient in protien. snacks "all day"  tried to get him to make an appt. states he does not have his schedule. says he will call back to make it. has a roommate. explained how dangerous drops in CBGs are. urged him to pack a lunch & snacks for his day. 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  he also wanted to know if the body builds up extra insulin. told him no. explained how with foods he can have a fairly stable blood sugar & avoid the peaks & valleys that feels so bad & can be life threatening. gave examples Follow-up by: Golden Circle RN,  February 05, 2010 11:37 AM  Additional Follow-up for Phone Call Additional follow up Details #1::        called pt told him to keep food at bedside at ngiht have peanut butter before going to bed.  Pt denies fever, chills, nausea, vomiting, diarrhea or constipation but does state he has been drinking a little more alcohol recently, told him that could be contributing and he really should be seen this week or next week at the latest, pt stated he will call to make appointment.  Additional Follow-up by: Antoine Primas DO,  February 05, 2010 12:05 PM

## 2010-07-01 NOTE — Assessment & Plan Note (Signed)
Summary: vomiting & diarrhea   Vital Signs:  Patient profile:   45 year old male Height:      72 inches Weight:      172 pounds BMI:     23.41 Temp:     98.5 degrees F oral Pulse rate:   106 / minute BP sitting:   132 / 87  (left arm) Cuff size:   large  Vitals Entered By: Jimmy Footman, CMA (March 18, 2010 3:57 PM) CC: n/v x1 day Is Patient Diabetic? Yes Did you bring your meter with you today? No   Primary Aaradhya Kysar:  . [facility]  CC:  n/v x1 day.  History of Present Illness: Pt. here with NB/NB emesis x1day along with diarrhea.  States he began having vomiting last pm after eating chicken salad sandwich because his blood sugar felt low.  States he had not been able to hold any food down this morning.  Also with multiple episodes of non-bloody diarrhea this morning.  Has had associated abdominal crampings with this, mainly in lower abdomen.  No fevers at home, has been checking blood sugars and adjusting insulin as needed since he hasn't been eating that much.  At this time he does not feel nausesous and has not had diarrhea x4-5 hours.  Pt. requests note for work and note to be exempt from jury duty since he has to administer himself insulin and check his CBG so often.  Habits & Providers  Alcohol-Tobacco-Diet     Tobacco Status: current     Cigarette Packs/Day: <0.25  Allergies: No Known Drug Allergies  Social History: Packs/Day:  <0.25  Review of Systems       Pertinent positives and negatives noted in HPI, Vitals signs noted   Physical Exam  General:  Well-developed,well-nourished,in no acute distress; alert,appropriate and cooperative throughout examination Head:  Normocephalic and atraumatic without obvious abnormalities. No apparent alopecia or balding. Eyes:  No corneal or conjunctival inflammation noted. EOMI. Perrla.  Vision grossly normal. Mouth:  Oral mucosa and oropharynx without lesions or exudates.  Teeth in good repair. Lungs:  Normal respiratory  effort, chest expands symmetrically. Lungs are clear to auscultation, no crackles or wheezes. Heart:  Normal rate and regular rhythm. S1 and S2 normal without gallop, murmur, click, rub or other extra sounds. Abdomen:  Mild tenderness diffusely, BS normoactive, no rebound or guarding Msk:  No deformity or scoliosis noted of thoracic or lumbar spine.     Impression & Recommendations:  Problem # 1:  GASTROENTERITIS (ICD-558.9) Likely stroenteritis, unsure if chicken salad contributed to this as it was new and not expired.  Patient with resolved symptoms at this point.  Advised on red flags to look for at home, and to stay well hydrated with symptomatic treatment.  Will write note for work and jury duty. Orders: [facility]- Est Level  3 ([address])  Complete Medication List: 1)  Bayer Childrens Aspirin 81 Mg Chew (Aspirin) .... Take 1 tablet by mouth once a day 2)  Lantus 100 Unit/ml Soln (Insulin glargine) .... Inject 58 unit subcutaneously at bedtime; disp qs for one month 3)  Accu-chek Active Strp (Glucose blood) .... Use twice a day - once before breakfast and once before bed; may substitute any acceptable brand. dispense 2 month supply 4)  Bd Insulin Syringe Ultrafine 29g X 1/2" 1 Ml Misc (Insulin syringe-needle u-100) .... Disp qs for one month 5)  Lipitor 40 Mg Tabs (Atorvastatin calcium) .... One by mouth daily for cholesterol 6)  Humalog  Kwikpen 100 Unit/ml Soln (Insulin lispro (human)) .... Use 1 unit on insulin for every 50 greater than 300.  Patient Instructions: 1)  Nice meeting you today. 2)  If you have increased nausea or vomiting, blood in your stool, develop fever go to the closest emergency department. 3)  Try to drink as much water as possible to stay hydrated   Orders Added: 1)  [facility]- Est Level  3 [04540]

## 2010-07-01 NOTE — Letter (Signed)
Summary: Generic Letter  [facility]  [address]   [place], [place] [address]   Phone: [phone]  Fax: [phone]    03/18/2010  ARLIN SASS [address] [place], [place]  [address]  To Whom it May Concern  Please excuse Jerrell Mangel from [employer] [employer] for medical reasons.  He is diabetic and his blood sugars are difficult to control and he has to check them frequently which may make it non-conducive to being on a jury.    Sincerely,   Everrett Coombe DO

## 2010-07-01 NOTE — Letter (Signed)
Summary: Out of Work  [facility]  [address]   [place], [place] [address]   Phone: [phone]  Fax: [phone]    July 11, 2009   Employee:  Cody Campbell    To Whom It May Concern:   For Medical reasons, please excuse the above named employee from work for the following dates:  Start:   Feburary 10, 2011  End:   Feburary 14, 2011  If you need additional information, please feel free to contact our office.         Sincerely,    Gladstone Pih

## 2010-07-01 NOTE — Progress Notes (Signed)
Summary: refill  Phone Note Refill Request Call back at Home Phone [phone] Message from:  Patient  pt is requesting refill on his Viagra [facility] [address]   Initial call taken by: De Nurse,  April 10, 2010 11:46 AM  Follow-up for Phone Call        Please send any team refill requests to the RN who should give a two week refill if apprt and then the patient needs to make an appointment Thanks! Follow-up by: Pearlean Brownie MD,  April 10, 2010 12:10 PM  Additional Follow-up for Phone Call Additional follow up Details #1::        spoke with patient and he had RX for Sheliah Plane last in Feb 2011. advised patient that it is not on his current med list and will not be able to refill at this time.  . he will need appointment for CPE  and to refill med . appointment scheduled 04/23/2010. patient in agreement.  Additional Follow-up by: Theresia Lo RN,  April 10, 2010 12:36 PM

## 2010-07-01 NOTE — Letter (Signed)
Summary: Generic Letter  [facility]  [address]   [place], [place] [address]   Phone: [phone]  Fax: [phone]    03/18/2010  [name] [address] [place], [place]  [address]  To Whom it May Concern,  Please excuse Cody Campbell from work Tuesday March 18, 2010 for medical reasons.  He has been under my care.    Sincerely,   Everrett Coombe DO

## 2010-07-01 NOTE — Miscellaneous (Signed)
Summary: chart update  Clinical Lists Changes  Problems: Removed problem of GASTROENTERITIS, VIRAL, ACUTE (ICD-008.8) Removed problem of ENCOUNTER FOR REMOVAL OF SUTURES (ICD-V58.32) Removed problem of SEBACEOUS CYST, NECK (ICD-706.2) Removed problem of SHORTNESS OF BREATH (ICD-786.05) Removed problem of UNSPECIFIED TACHYCARDIA (ICD-785.0) Medications: Removed medication of TRAMADOL HCL 50 MG TABS (TRAMADOL HCL) 1-2 tabs by mouth every 4-6 hours as needed Removed medication of FLEXERIL 10 MG TABS (CYCLOBENZAPRINE HCL) one by mouth three times a day as needed for spasm

## 2010-07-01 NOTE — Letter (Signed)
Summary: Generic Letter  [facility]  [address]   [place], [place] [address]   Phone: [phone]  Fax: [phone]    01/07/2010  JOHNTAY DOOLEN [address] [place], [place]  [address]  To Whom it May Concern:  Please excuse Cody Campbell from work Monday August 8 to Wednesday August 10 for medical reasons.  He has been under my care.     Sincerely,   Renold Don MD

## 2010-07-01 NOTE — Progress Notes (Signed)
Summary: triage  Phone Note Call from Patient Call back at Home Phone [phone]   Caller: Patient Summary of Call: Pt throwing up feeling real weak.  Asking to be seen this morning. Initial call taken by: Clydell Hakim,  August 28, 2009 9:09 AM  Follow-up for Phone Call        started this am. has vomited 4 times this am. no diarrhea. states he breaks out in a sweat. 340 cbg this am. states the numbers vary according to what he ate the night before. asked him to come in now. stated he would Follow-up by: Golden Circle RN,  August 28, 2009 9:20 AM

## 2010-07-01 NOTE — Progress Notes (Signed)
Summary: refill  Phone Note Refill Request Call back at Home Phone [phone] Message from:  Patient  needles for the ultrafine pen also for one touch ultra test strips & meter [facility] [address]  Initial call taken by: De Nurse,  February 13, 2010 3:19 PM  Follow-up for Phone Call        I called them in but NOT a new meter. message was not clear. pharmacy to call back if any problems Follow-up by: Golden Circle RN,  February 13, 2010 3:30 PM     Appended Document: refill the pharmacy did call back. he [facility] per message. I called pt & his insurance will not pay for that one. needs to have the one touch. ordered new meter & strips  3 times a day testing with 11 refills

## 2010-07-01 NOTE — Assessment & Plan Note (Signed)
Summary: stomach virus,tcb   Vital Signs:  Patient profile:   44 year old male Height:      72 inches Weight:      167 pounds BMI:     22.73 BSA:     1.97 Temp:     98.7 degrees F Pulse rate:   120 / minute BP sitting:   110 / 80  Vitals Entered By: Jone Baseman CMA (August 30, 2009 3:02 PM) CC: stomach virus x 3 days Is Patient Diabetic? Yes Did you bring your meter with you today? No Pain Assessment Patient in pain? no        Primary Care Provider:  Myrtie Soman  MD  CC:  stomach virus x 3 days.  History of Present Illness: 45 yo male with hx of DM type 2 with recent stomach virus.  Pt was having vomiting about 4 times a day for 2 days yesterday was able to tolerate solid foods. Pt is feeling better, no fever, concern of why he seems to keep getting ill. Pt denies any fevers and did not have diarrhea or any systemic symptoms.  Pt states that he had a hole in the roof of his mouth from the stomach acid but it is now healing well.   Pt blood sugar was very elevated to 300-400 while sick now is doing a little better.  Pt though wuld like to have a humalog pen on hand because when he gets sick and his blood sugar is high lantus takes too long to react.  Pt has done a sliding scale before and understands the concept.  Pt also needs needles for his lantus.  Pt is taking 56 untis daily  Pt is smoking still, does not want to quit.  smoke 3 cigars a day.   Habits & Providers  Alcohol-Tobacco-Diet     Tobacco Status: current     Tobacco Counseling: to quit use of tobacco products     Other Tobacco cigar     Other per week 21  Current Medications (verified): 1)  Bayer Childrens Aspirin 81 Mg Chew (Aspirin) .... Take 1 Tablet By Mouth Once A Day 2)  Lantus 100 Unit/ml Soln (Insulin Glargine) .... Inject 58 Unit Subcutaneously At Bedtime; Disp Qs For One Month 3)  Accu-Chek Active  Strp (Glucose Blood) .... Use Twice A Day - Once Before Breakfast and Once Before Bed; May  Substitute Any Acceptable Brand. Dispense 2 Month Supply 4)  Tramadol Hcl 50 Mg Tabs (Tramadol Hcl) .Marland Kitchen.. 1-2 Tabs By Mouth Every 4-6 Hours As Needed 5)  Flexeril 10 Mg Tabs (Cyclobenzaprine Hcl) .... One By Mouth Three Times A Day As Needed For Spasm 6)  Bd Insulin Syringe Ultrafine 29g X 1/2" 1 Ml Misc (Insulin Syringe-Needle U-100) .... Disp Qs For One Month 7)  Lipitor 40 Mg Tabs (Atorvastatin Calcium) .... One By Mouth Daily For Cholesterol 8)  Humalog Kwikpen 100 Unit/ml Soln (Insulin Lispro (Human)) .... Use 1 Unit On Insulin For Every 50 Greater Than 300.  Allergies (verified): No Known Drug Allergies  Past History:  Past medical, surgical, family and social histories (including risk factors) reviewed, and no changes noted (except as noted below).  Past Medical History: Reviewed history from 07/03/2009 and no changes required. noncompliant with diabetes diet HLD diabetes type 2 - poorly controlled tobacco abuse  Past Surgical History: Reviewed history from 07/29/2006 and no changes required. CMP wnl (cr=1.0) except glu 138 - 11/13/2004, TSH 1.045, lipase <10 - 11/13/2004  Family History: Reviewed history from 07/03/2009 and no changes required. HTN, Mother -  half-sister died of a `syndrome` 1 sister - healthy 3 brothers -healthy Father died at 38 from CHF, also had DM2  No family history of DVTs or PEs.   Social History: Reviewed history from 07/03/2009 and no changes required. recently divorced; son Thayer Ohm) recently put in jail (3.5 years)); 3 kids (Levon, 39; [place], Girard; Renee, 22).; Works as Audiological scientist; smokes 2-3 cigars daily; occ ETOH; occasional THC use.   Review of Systems       deniesa fever, chills, nausea, vomiting, diarrhea or constipation today  Physical Exam  General:  alert, well-developed, well-nourished, and well-hydrated.  maildly underweight for his height Eyes:  mild sclera icterus and a small subconjuntival hemorrhage that is healing.   PERRLA, EOMI  Mouth:  mucus membranes well-hydrated. does have small "defect" in soft palate, no erythema Lungs:  coarse breath sounds but otherwise CTAB, good air movement Heart:  RRR no murmur Abdomen:  soft, non-tender, normal bowel sounds, no distention, no masses, no guarding, no rigidity, and no rebound tenderness.     Impression & Recommendations:  Problem # 1:  GASTROENTERITIS, VIRAL, ACUTE (ICD-008.8) Assessment Improved  continue to increase by mouth intake, if return please be seen.  If pt continue to get illnesses, may consider a immunologic w/u (HIV, syphylllis, etc)  Orders: [facility]- Est  Level 4 (51761)  Problem # 2:  DIABETES MELLITUS II, UNCOMPLICATED (ICD-250.00)  Given Kwick pen to have on hand.  Give a very mild sliding scale of greater than 300.  Increase lantus to 58 untis nightly.  A1C due in 5/11.   His updated medication list for this problem includes:    Bayer Childrens Aspirin 81 Mg Chew (Aspirin) .Marland Kitchen... Take 1 tablet by mouth once a day    Lantus 100 Unit/ml Soln (Insulin glargine) ..... Inject 58 unit subcutaneously at bedtime; disp qs for one month    Humalog Kwikpen 100 Unit/ml Soln (Insulin lispro (human)) ..... Use 1 unit on insulin for every 50 greater than 300.  Orders: [facility]- Est  Level 4 (60737)  Problem # 3:  TOBACCO DEPENDENCE (ICD-305.1) Pt no t willing to quit Orders: [facility]- Est  Level 4 (10626)  Complete Medication List: 1)  Bayer Childrens Aspirin 81 Mg Chew (Aspirin) .... Take 1 tablet by mouth once a day 2)  Lantus 100 Unit/ml Soln (Insulin glargine) .... Inject 58 unit subcutaneously at bedtime; disp qs for one month 3)  Accu-chek Active Strp (Glucose blood) .... Use twice a day - once before breakfast and once before bed; may substitute any acceptable brand. dispense 2 month supply 4)  Tramadol Hcl 50 Mg Tabs (Tramadol hcl) .Marland Kitchen.. 1-2 tabs by mouth every 4-6 hours as needed 5)  Flexeril 10 Mg Tabs (Cyclobenzaprine hcl) .... One by mouth three  times a day as needed for spasm 6)  Bd Insulin Syringe Ultrafine 29g X 1/2" 1 Ml Misc (Insulin syringe-needle u-100) .... Disp qs for one month 7)  Lipitor 40 Mg Tabs (Atorvastatin calcium) .... One by mouth daily for cholesterol 8)  Humalog Kwikpen 100 Unit/ml Soln (Insulin lispro (human)) .... Use 1 unit on insulin for every 50 greater than 300. Prescriptions: BD INSULIN SYRINGE ULTRAFINE 29G X 1/2" 1 ML MISC (INSULIN SYRINGE-NEEDLE U-100) Disp QS for one month  #30 x 3   Entered and Authorized by:   Antoine Primas DO   Signed by:   Antoine Primas DO on 08/30/2009  Method used:   Electronically to        [id number]* (retail)       [address] [place], [place]  [address]       Ph: [phone]       Fax: [phone]   RxID:   [id number] HUMALOG KWIKPEN 100 UNIT/ML SOLN (INSULIN LISPRO (HUMAN)) use 1 unit on insulin for every 50 greater than 300.  #1 x 3   Entered and Authorized by:   Antoine Primas DO   Signed by:   Antoine Primas DO on 08/30/2009   Method used:   Electronically to        [id number]* (retail)       [address] [place], [place]  [address]       Ph: [phone]       Fax: [phone]   RxID:   [id number]

## 2010-07-01 NOTE — Progress Notes (Signed)
  Phone Note Call from Patient   Caller: Patient Call For: [phone] Summary of Call: Mr. Zapien had food poisoning symptoms this morning.  Has not subsided, but need a doctor's note to go into work.  Would like an appt this afternoon.  Initial call taken by: Abundio Miu,  March 18, 2010 9:45 AM  Follow-up for Phone Call        vomiting & diarrhea. ate chicken salad yesterday. started 5am today. stools are like water. wants late day appt. told him to come in by 3. explained this is a work in & there may be a wait. he agreed Follow-up by: Golden Circle RN,  March 18, 2010 9:57 AM

## 2010-07-01 NOTE — Assessment & Plan Note (Signed)
Summary: suture removal,df  Nurse Visit patient in for suture removal. Dr. Sheffield Slider assited RN in removing sutures. Four sutures removed. Incision is clean ,dry no, drainage. Dr. Sheffield Slider does not think steri strips are indicated. Wound care instructios given. Theresia Lo RN  July 19, 2009 11:11 AM   Allergies: No Known Drug Allergies  Orders Added: 1)  Est Level 1- [facility] [60454]

## 2010-08-15 LAB — GLUCOSE, CAPILLARY: Glucose-Capillary: 228 mg/dL — ABNORMAL HIGH (ref 70–99)

## 2010-08-21 ENCOUNTER — Telehealth: Payer: Self-pay | Admitting: Family Medicine

## 2010-08-21 MED ORDER — INSULIN GLARGINE 100 UNIT/ML ~~LOC~~ SOLN: SUBCUTANEOUS | Status: DC

## 2010-08-21 NOTE — Telephone Encounter (Signed)
Pt called his pharmacy last week for a refill on his lantus & still hasn't heard anything, pt goes to [facility]

## 2010-08-21 NOTE — Telephone Encounter (Signed)
Larita Fife,  Do you recall this refill request?

## 2010-09-10 LAB — GLUCOSE, CAPILLARY: Glucose-Capillary: 138 mg/dL — ABNORMAL HIGH (ref 70–99)

## 2010-10-31 ENCOUNTER — Encounter: Payer: Self-pay | Admitting: Family Medicine

## 2010-10-31 ENCOUNTER — Ambulatory Visit (INDEPENDENT_AMBULATORY_CARE_PROVIDER_SITE_OTHER): Payer: 59 | Admitting: Family Medicine

## 2010-10-31 DIAGNOSIS — E119 Type 2 diabetes mellitus without complications: Secondary | ICD-10-CM

## 2010-10-31 DIAGNOSIS — R252 Cramp and spasm: Secondary | ICD-10-CM

## 2010-10-31 LAB — PHOSPHORUS: Phosphorus: 3.4 mg/dL (ref 2.3–4.6)

## 2010-10-31 LAB — CK: Total CK: 137 U/L (ref 7–232)

## 2010-10-31 LAB — COMPREHENSIVE METABOLIC PANEL
AST: 16 U/L (ref 0–37)
BUN: 13 mg/dL (ref 6–23)
Calcium: 9.7 mg/dL (ref 8.4–10.5)
Chloride: 99 mEq/L (ref 96–112)
Creat: 0.78 mg/dL (ref 0.50–1.35)
Glucose, Bld: 265 mg/dL — ABNORMAL HIGH (ref 70–99)

## 2010-10-31 MED ORDER — CYCLOBENZAPRINE HCL 10 MG PO TABS: 10.0000 mg | ORAL_TABLET | Freq: Three times a day (TID) | ORAL | Status: AC | PRN

## 2010-10-31 MED ORDER — POTASSIUM CHLORIDE ER 10 MEQ PO TBCR: 10.0000 meq | EXTENDED_RELEASE_TABLET | Freq: Two times a day (BID) | ORAL | Status: DC

## 2010-10-31 MED ORDER — INSULIN GLARGINE 100 UNIT/ML ~~LOC~~ SOLN: SUBCUTANEOUS | Status: DC

## 2010-10-31 NOTE — Progress Notes (Signed)
  Subjective:    Patient ID: Cody Campbell, male    DOB: [birth year], 45 y.o.   MRN: [id number]  HPI 1. Here to discuss muscle spasms for the past month.  Occuring every day. Describes cramps b/l calf, thigh arms, between shoulder, back.  Severity: 5-10/10 Worse when her first wakes up.  Made better with: nothing. Tried flexeril which helped him sleep but did not help with the pain. Aleve somewhat helps. Has to wait to hours for the sensation to resolve.   Made worse with: nothing.  Pt does work as an Counsellor. He does not feel that the lifting affects his pain or spasms. He has to crouch when he drives which he feels makes it worse.  He has never had this type of pain before.  Pt smoke 1-2 black and mild cigarillos daily, drinks socially, occassional marijuana use.   2. DM-Taking insulin. Poorly compliant. There was a problem with the dose 56 U --> 53 U and pt was charged more to get insulin. Cost is a barrier. Also taking humulog 5-15 U on sliding scale. Sometimes skips insulin if her feels he blood sugars. Fasting range 170-- 210. Denies polyuria, polydipsia, N/V/D.   Review of Systems  Constitutional: Positive for chills, diaphoresis and fatigue. Negative for fever, activity change, appetite change and unexpected weight change.  Musculoskeletal: Positive for myalgias, back pain and gait problem. Negative for joint swelling and arthralgias.  Neurological: Positive for weakness and numbness. Negative for dizziness, tremors, seizures, facial asymmetry, speech difficulty, light-headedness and headaches.       Objective:   Physical Exam  Constitutional: He is oriented to person, place, and time. He appears well-developed and well-nourished.  Eyes: Pupils are equal, round, and reactive to light.  Musculoskeletal: He exhibits no edema and no tenderness.  Neurological: He is alert and oriented to person, place, and time. He has normal strength. No cranial nerve deficit or sensory  deficit. Coordination and gait normal. GCS eye subscore is 4. GCS verbal subscore is 5. GCS motor subscore is 6. He displays no Babinski's sign on the right side. He displays no Babinski's sign on the left side.  Reflex Scores:      Bicep reflexes are 1+ on the right side and 1+ on the left side.      Patellar reflexes are 2+ on the right side and 2+ on the left side.      Achilles reflexes are 2+ on the right side and 2+ on the left side.      Intact proprioception in toes. Normal monofilament in feet. B/l.           Assessment & Plan:

## 2010-10-31 NOTE — Assessment & Plan Note (Addendum)
Unclear etiology.  ? Electrolyte abnormality. Plan to check CMET, Mag, Phos.  KCL as it may improve muscle cramps even in the setting of normal K+.  ? Myositis- less likely given pt is nontender. Plan to check CK.  ? Inflammatory process- once again less likely given pt is nontender. Did not check ESR but will if pain progressives or he has changes in PE findings.  Flexeril prn pain.

## 2010-10-31 NOTE — Assessment & Plan Note (Addendum)
Declined. Pt A1c up from baseline.  Pt is not consistently compliant with insulin. Advised pt take insulin in AM after checking fasting blood sugar.  F/u with repeat A1c in 3 mos.  F/u compliance sooner in 1 mos.

## 2010-10-31 NOTE — Patient Instructions (Addendum)
Thank you for coming in today. Make a few adjustments to your regimen: lantus in the morning.  Dose adjusted.  For the muscle cramps: take the potassium supplements. We will check some blood work and I will call you with the results.

## 2010-11-10 ENCOUNTER — Telehealth: Payer: Self-pay | Admitting: Family Medicine

## 2010-11-10 NOTE — Telephone Encounter (Signed)
Called pt at home and discussed labs. B/l leg cramps are slowly subsiding. We discussed his A1c (up from last visit). Pt is taking his lantus, but not the humolog. I advised pt take both long and short acting insulin and keep a log of fasting blood sugars and any sensations/reading of low blood sugars so we can adjust his regimen. He is in agreement. Asked pt to call for f/u appt at end of the month.

## 2010-12-23 ENCOUNTER — Ambulatory Visit (INDEPENDENT_AMBULATORY_CARE_PROVIDER_SITE_OTHER): Payer: 59 | Admitting: Family Medicine

## 2010-12-23 ENCOUNTER — Encounter: Payer: Self-pay | Admitting: Family Medicine

## 2010-12-23 VITALS — BP 144/100 | HR 106 | Temp 98.3°F | Wt 176.0 lb

## 2010-12-23 DIAGNOSIS — M549 Dorsalgia, unspecified: Secondary | ICD-10-CM

## 2010-12-23 NOTE — Progress Notes (Signed)
Subjective:    Cody Campbell is a 45 y.o. male who presents for evaluation of low back pain. The patient has had recurrent self limited episodes of low back pain in the past. Symptoms have been present for 2 days and are gradually improving.  Onset was related to / precipitated by moving a washing machine on a dolly, the dolly fell forward when he had both hands on it and pulled his shoulders and upper back. . The pain is located in the interscapular area and does not radiate. The pain is described as aching and stiffness and occurs all day. He rates his pain as mild. Symptoms are exacerbated by movement.. Symptoms are improved by heat and NSAIDs. He has no other symptoms associated with the back pain. The patient has no "red flag" history indicative of complicated back pain.  He needs a note to return to work.   Review of Systems Pertinent items are noted in HPI.    Objective:   Normal reflexes, gait, strength and negative straight-leg raise. Full range of motion, Normal sensation in upper and lower extremities.  Pt with muscle spasm in intrascapular muscles.     Assessment:    Muscle strain    Plan:    Natural history and expected course discussed. Questions answered. Heat to affected area as needed for local pain relief. NSAIDs per medication orders. Muscle relaxants per medication orders.  OK to return to work as long as symptoms improving, not worsening.

## 2010-12-23 NOTE — Patient Instructions (Signed)
It was nice to meet you, I am sorry you hurt your back.  I would recommend taking Aleve once in the morning and once at night time for 5 more days.  You can take flexeril at bedtime, but if it makes you drowsy do not take it before work.  Please contact the office if your pain worsens.

## 2011-02-12 ENCOUNTER — Other Ambulatory Visit: Payer: Self-pay | Admitting: Family Medicine

## 2011-02-12 NOTE — Telephone Encounter (Signed)
Appointment scheduled for 02/26/2011 with Dr. Lula Olszewski.

## 2011-02-26 ENCOUNTER — Ambulatory Visit: Payer: 59 | Admitting: Family Medicine

## 2011-02-26 LAB — CK TOTAL AND CKMB (NOT AT ARMC)
CK, MB: 0.6
Relative Index: INVALID
Total CK: 51

## 2011-02-26 LAB — URINALYSIS, ROUTINE W REFLEX MICROSCOPIC
Glucose, UA: >1000 — AB
Ketones, ur: 40 — AB
Leukocytes, UA: NEGATIVE
Nitrite: NEGATIVE
Protein, ur: 30 — AB
pH: 6

## 2011-02-26 LAB — POCT CARDIAC MARKERS
CKMB, poc: <1 — ABNORMAL LOW
Myoglobin, poc: 39
Operator id: 151321
Troponin i, poc: 0.08 — ABNORMAL HIGH

## 2011-02-26 LAB — CBC
HCT: 41.1
Hemoglobin: 14.1
MCHC: 34.3
MCV: 94.6
Platelets: 248
RBC: 4.34
RDW: 13.2
WBC: 4.3

## 2011-02-26 LAB — COMPREHENSIVE METABOLIC PANEL
ALT: 16
AST: 18
Albumin: 3.7
Alkaline Phosphatase: 59
Calcium: 9.3
GFR calc Af Amer: >60
Potassium: 3.5
Sodium: 132 — ABNORMAL LOW
Total Protein: 6.2

## 2011-02-26 LAB — TROPONIN I: Troponin I: <0.01

## 2011-02-26 LAB — DIFFERENTIAL
Basophils Relative: 0
Eosinophils Absolute: 0.1
Lymphs Abs: 1.1
Monocytes Absolute: 0.6
Monocytes Relative: 14 — ABNORMAL HIGH

## 2011-02-26 LAB — URINE MICROSCOPIC-ADD ON

## 2011-02-26 LAB — B-NATRIURETIC PEPTIDE (CONVERTED LAB): Pro B Natriuretic peptide (BNP): <30

## 2011-03-14 ENCOUNTER — Other Ambulatory Visit: Payer: Self-pay | Admitting: Family Medicine

## 2011-03-16 NOTE — Telephone Encounter (Signed)
Refill sent in for test strips. Message left on voicemail for patient to call for appointment.

## 2011-05-20 ENCOUNTER — Ambulatory Visit (INDEPENDENT_AMBULATORY_CARE_PROVIDER_SITE_OTHER): Payer: 59 | Admitting: Family Medicine

## 2011-05-20 ENCOUNTER — Encounter: Payer: Self-pay | Admitting: Family Medicine

## 2011-05-20 VITALS — BP 149/95 | HR 114 | Temp 98.4°F | Ht 72.0 in | Wt 169.0 lb

## 2011-05-20 DIAGNOSIS — R7989 Other specified abnormal findings of blood chemistry: Secondary | ICD-10-CM

## 2011-05-20 DIAGNOSIS — I10 Essential (primary) hypertension: Secondary | ICD-10-CM

## 2011-05-20 DIAGNOSIS — E119 Type 2 diabetes mellitus without complications: Secondary | ICD-10-CM

## 2011-05-20 DIAGNOSIS — K5289 Other specified noninfective gastroenteritis and colitis: Secondary | ICD-10-CM

## 2011-05-20 LAB — COMPREHENSIVE METABOLIC PANEL
ALT: 59 U/L — ABNORMAL HIGH (ref 0–53)
AST: 101 U/L — ABNORMAL HIGH (ref 0–37)
Albumin: 4.7 g/dL (ref 3.5–5.2)
CO2: 25 mEq/L (ref 19–32)
Calcium: 9.5 mg/dL (ref 8.4–10.5)
Chloride: 100 mEq/L (ref 96–112)
Creat: 0.9 mg/dL (ref 0.50–1.35)
Potassium: 4.6 mEq/L (ref 3.5–5.3)

## 2011-05-20 MED ORDER — INSULIN GLARGINE 100 UNIT/ML ~~LOC~~ SOLN: SUBCUTANEOUS | Status: DC

## 2011-05-20 MED ORDER — INSULIN LISPRO 100 UNIT/ML ~~LOC~~ SOLN: SUBCUTANEOUS | Status: DC

## 2011-05-20 MED ORDER — LISINOPRIL 5 MG PO TABS: 5.0000 mg | ORAL_TABLET | Freq: Every day | ORAL | Status: DC

## 2011-05-20 MED ORDER — GLUCOSE BLOOD VI STRP: ORAL_STRIP | Status: DC

## 2011-05-20 NOTE — Progress Notes (Signed)
  Subjective:    Patient ID: Cody Campbell. Bob, male    DOB: June 06, 1965, 45 y.o.   MRN: [id number]  HPI  work in appt for 3rd day of nausea, vomiting.  3 days ago, vomited many times, noted BS elevated at 500's vs 100-200's typically.  Emesis last occurred yesterday morning.  Fever ended yesterday.   Yesterday weak, today better.  Still appetite poor.      Exposure to rotavirus from a coworkers kids.  Also notes intermittent migratory tingling or dry skin throughout body.  Today blood sugar 215 right before coming here.  Has not eaten  anything today- does not typically eat during the morning. . Takes Lantus 56 units once nightly and humalog sliding scale  Review of Systems General:  Negative for malaise, myalgias HEENT: Negative for conjunctivitis, ear pain or drainage, rhinorrhea, nasal congestion, sore throat Respiratory:  Negative for cough, sputum, dyspnea Abdomen: Negative for abdominal pain, emesis, diarrhea Skin:  Negative for rash        Objective:   Physical Exam GEN: Alert & Oriented, No acute distress CV:  Regular Rate & Rhythm, no murmur Respiratory:  Normal work of breathing, CTAB Abd:  + BS, soft, no tenderness to palpation Ext: no pre-tibial edema        Assessment & Plan:

## 2011-05-20 NOTE — Patient Instructions (Signed)
Drink lots of water  Make appointment to see Dr. Armen Pickup in 10-14 days for diabetes and blood pressure  New medicine Lisinopril for blood pressure and kidney protection

## 2011-05-20 NOTE — Assessment & Plan Note (Addendum)
Newly diagnosed today.  Consistently elevated blood pressure, with DM, not currently on any treatment.  Will draw baseline K and cr and have hime return for office visit with PCP in 10-14 days to recheck and lab draw.

## 2011-05-20 NOTE — Assessment & Plan Note (Signed)
Viral gastroenteritis almost resolved.  Continued supportive care.

## 2011-05-21 ENCOUNTER — Encounter: Payer: Self-pay | Admitting: Family Medicine

## 2011-05-21 DIAGNOSIS — R7989 Other specified abnormal findings of blood chemistry: Secondary | ICD-10-CM

## 2011-05-21 HISTORY — DX: Other specified abnormal findings of blood chemistry: R79.89

## 2011-05-21 NOTE — Assessment & Plan Note (Signed)
Minimally elevated ALT, AST elevated over double in setting of acute gastroenteritis.  Will redraw in 2 weeks when he returns and consider further workup if no resolution.

## 2011-06-04 ENCOUNTER — Ambulatory Visit: Payer: 59 | Admitting: Family Medicine

## 2011-07-22 ENCOUNTER — Encounter: Payer: Self-pay | Admitting: Family Medicine

## 2011-07-22 ENCOUNTER — Ambulatory Visit (INDEPENDENT_AMBULATORY_CARE_PROVIDER_SITE_OTHER): Payer: 59 | Admitting: Family Medicine

## 2011-07-22 VITALS — BP 120/87 | HR 130 | Temp 98.3°F | Ht 72.0 in | Wt 165.0 lb

## 2011-07-22 DIAGNOSIS — R000 Tachycardia, unspecified: Secondary | ICD-10-CM

## 2011-07-22 DIAGNOSIS — E119 Type 2 diabetes mellitus without complications: Secondary | ICD-10-CM

## 2011-07-22 DIAGNOSIS — E785 Hyperlipidemia, unspecified: Secondary | ICD-10-CM

## 2011-07-22 DIAGNOSIS — I10 Essential (primary) hypertension: Secondary | ICD-10-CM

## 2011-07-22 DIAGNOSIS — R7989 Other specified abnormal findings of blood chemistry: Secondary | ICD-10-CM

## 2011-07-22 DIAGNOSIS — K5289 Other specified noninfective gastroenteritis and colitis: Secondary | ICD-10-CM

## 2011-07-22 LAB — CBC
HCT: 43.2 % (ref 39.0–52.0)
MCHC: 34 g/dL (ref 30.0–36.0)
MCV: 96 fL (ref 78.0–100.0)
RDW: 13.7 % (ref 11.5–15.5)

## 2011-07-22 LAB — COMPREHENSIVE METABOLIC PANEL
ALT: 19 U/L (ref 0–53)
AST: 36 U/L (ref 0–37)
Calcium: 9.8 mg/dL (ref 8.4–10.5)
Chloride: 101 mEq/L (ref 96–112)
Creat: 1.12 mg/dL (ref 0.50–1.35)
Sodium: 135 mEq/L (ref 135–145)
Total Bilirubin: 0.7 mg/dL (ref 0.3–1.2)
Total Protein: 7 g/dL (ref 6.0–8.3)

## 2011-07-22 LAB — POCT GLYCOSYLATED HEMOGLOBIN (HGB A1C): Hemoglobin A1C: 10.8

## 2011-07-22 MED ORDER — INSULIN LISPRO 100 UNIT/ML ~~LOC~~ SOLN: SUBCUTANEOUS | Status: DC

## 2011-07-22 MED ORDER — INSULIN GLARGINE 100 UNIT/ML ~~LOC~~ SOLN: SUBCUTANEOUS | Status: DC

## 2011-07-22 MED ORDER — GLUCOSE BLOOD VI STRP: ORAL_STRIP | Status: DC

## 2011-07-22 NOTE — Patient Instructions (Signed)
Thank you for coming to see me today. Please work on your sugars by: 1. Take lantus every night.  2. Take humolog base on scale, being careful let sugar drop below 70.   I will call your blood work, unless all normal then a letter will be sent.   Dr. Armen Pickup

## 2011-07-24 ENCOUNTER — Telehealth: Payer: Self-pay | Admitting: Family Medicine

## 2011-07-24 NOTE — Telephone Encounter (Signed)
Spoke with patient . He states he only got one vial of insulin and it will not last him a month.  I spoke with pharmacist .He requires 1680 units per month.  However his insurance will only pay for 30 days supply and will not go over that amount. Two vials will last  34 days but  insurance can't be billed that way . He will have to pay another copay after 30 days.  Sam the   pharmacist will contact insurance and try to work out something .  Patient notified.

## 2011-07-24 NOTE — Telephone Encounter (Signed)
Pt states his insulin isn't enough to last all month.  He says this happens all the time and wants to speak to nurse

## 2011-07-28 ENCOUNTER — Encounter: Payer: Self-pay | Admitting: Family Medicine

## 2011-07-28 MED ORDER — INSULIN GLARGINE 100 UNIT/ML ~~LOC~~ SOLN: 56.0000 [IU] | Freq: Every day | SUBCUTANEOUS | Status: DC

## 2011-07-28 NOTE — Progress Notes (Signed)
Subjective:     Patient ID: Cody Campbell. Molner, male   DOB: [birth year], 46 y.o.   MRN: [id number]  HPI 46 yo M presents with a complaint of two days of GI upset and malaise. He has missed work and is in need of a doctor's notes. He reports that yesterday he woke up with nausea. He checked his blood sugar and it was 445. He took his insulin and went to work. He continued to feel ill and went home when he learned that his pulse was racing. He admits to non bloody, non bilious emesis x 3 episodes. He denies fever, diarrhea or constipation. He lives alone and denies sick contacts. He admits to drinking 2-3 glasses of brandy per weeks and 1-2 beers on the weekend. He denies drinking in the last few days.  He does monitor his blood sugar. He admits to some lows 66-74 and some highs (400s) but states his sugars are usually range from 130 to 225. He reports taking his insulin but admits that he does not always take all of his lantus (at night) and mainly controls his blood sugar with the humolog usually taking 10-20 units per meal. He admits to skipping meals to lower his blood sugar. He denies CP, admits to SOB 1 day ago, admit to occasional allodynia in his arms and legs.   He is a current everyday smoker. He smokes 2 black and mild cigars per day.   Review of Systems As per HPI    Objective:   Physical Exam BP 120/87  Pulse 130  Temp(Src) 98.3 F (36.8 C) (Oral)  Ht 6' (1.829 m)  Wt 165 lb (74.844 kg)  BMI 22.38 kg/m2 General appearance: alert, cooperative and no distress Eyes: conjunctivae/corneas clear. PERRL, EOM's intact. Throat: lips, mucosa, and tongue normal; teeth and gums normal Neck: no adenopathy, no carotid bruit, no JVD, supple, symmetrical, trachea midline.  Lungs: clear to auscultation bilaterally Heart: regular rate. tachy rhythm, S1, S2 normal, no murmur, click, rub or gallop Abdomen: soft, non-tender; bowel sounds normal; no masses,  no organomegaly Skin: Skin color, texture,  turgor normal. No rashes or lesions Neurologic: Grossly normal    Assessment:        Plan:

## 2011-07-28 NOTE — Assessment & Plan Note (Addendum)
A: declined. Symptomatic hyperglycemia.  Meds: patient taking medication incorrectly. No symptomatic lows. Many highs.  P: -lantus qHS (main insulin) -continue SSI -CBG check TID and q HS. -regular, consistent meals at least 3x per day.  -direct LDL and CMET

## 2011-07-28 NOTE — Assessment & Plan Note (Signed)
LFTs now normal

## 2011-07-28 NOTE — Assessment & Plan Note (Addendum)
A: persistent. No active chest pain. Possible due to mild dehydration in the setting of recent emesis and poor po intake. P: hydration, rest, blood sugar control. Close f/u.

## 2011-07-28 NOTE — Assessment & Plan Note (Addendum)
A: diet controlled.  P: check direct LDL.  If elevated will check FLP.  LDL goal < 130.

## 2011-07-28 NOTE — Assessment & Plan Note (Signed)
A: BP well controlled with ACE Med: compliant. No side effects.  P: check Cr.

## 2011-08-22 ENCOUNTER — Other Ambulatory Visit: Payer: Self-pay | Admitting: Family Medicine

## 2011-09-23 ENCOUNTER — Encounter: Payer: Self-pay | Admitting: Family Medicine

## 2011-12-31 ENCOUNTER — Ambulatory Visit: Payer: 59 | Admitting: Family Medicine

## 2012-03-16 ENCOUNTER — Ambulatory Visit (INDEPENDENT_AMBULATORY_CARE_PROVIDER_SITE_OTHER): Payer: 59 | Admitting: Family Medicine

## 2012-03-16 ENCOUNTER — Encounter: Payer: Self-pay | Admitting: Family Medicine

## 2012-03-16 VITALS — BP 123/104 | HR 126 | Temp 97.7°F | Ht 74.0 in | Wt 170.4 lb

## 2012-03-16 DIAGNOSIS — E119 Type 2 diabetes mellitus without complications: Secondary | ICD-10-CM

## 2012-03-16 DIAGNOSIS — A088 Other specified intestinal infections: Secondary | ICD-10-CM

## 2012-03-16 DIAGNOSIS — A084 Viral intestinal infection, unspecified: Secondary | ICD-10-CM

## 2012-03-16 LAB — POCT GLYCOSYLATED HEMOGLOBIN (HGB A1C): Hemoglobin A1C: 10

## 2012-03-16 NOTE — Assessment & Plan Note (Signed)
Improving viral gastroenteritis.  Advised on symptomatic care and red flags for return.  Wrote note for work.

## 2012-03-16 NOTE — Patient Instructions (Addendum)
Your a1c is 10.  You need to make an appointment to see yoru primary doctor ASAP  Viral Gastroenteritis Viral gastroenteritis is also called stomach flu. This illness is caused by a certain type of germ (virus). It can cause sudden watery poop (diarrhea) and throwing up (vomiting). This can cause you to lose body fluids (dehydration). This illness usually lasts for 3 to 8 days. It usually goes away on its own. HOME CARE    Drink enough fluids to keep your pee (urine) clear or pale yellow. Drink small amounts of fluids often.   Ask your doctor how to replace body fluid losses (rehydration).   Avoid:   Foods high in sugar.   Alcohol.   Bubbly (carbonated) drinks.   Tobacco.   Juice.   Caffeine drinks.   Very hot or cold fluids.   Fatty, greasy foods.   Eating too much at one time.   Dairy products until 24 to 48 hours after your watery poop stops.   You may eat foods with active cultures (probiotics). They can be found in some yogurts and supplements.   Wash your hands well to avoid spreading the illness.   Only take medicines as told by your doctor. Do not give aspirin to children. Do not take medicines for watery poop (antidiarrheals).   Ask your doctor if you should keep taking your regular medicines.   Keep all doctor visits as told.  GET HELP RIGHT AWAY IF:    You cannot keep fluids down.   You do not pee at least once every 6 to 8 hours.   You are short of breath.   You see blood in your poop or throw up. This may look like coffee grounds.   You have belly (abdominal) pain that gets worse or is just in one small spot (localized).   You keep throwing up or having watery poop.   You have a fever.   The patient is a child younger than 3 months, and he or she has a fever.   The patient is a child older than 3 months, and he or she has a fever and problems that do not go away.   The patient is a child older than 3 months, and he or she has a fever and  problems that suddenly get worse.   The patient is a baby, and he or she has no tears when crying.  MAKE SURE YOU:    Understand these instructions.   Will watch your condition.   Will get help right away if you are not doing well or get worse.  Document Released: 11/04/2007 Document Revised: 08/10/2011 Document Reviewed: 03/04/2011 [facility] Patient Information 2013 [place], [place].

## 2012-03-16 NOTE — Assessment & Plan Note (Signed)
Discussed importance of continuing to follow-up with diabetes care- a1c elevated today.  He declines flu shot, will return for diabetes checkup with PCP and agrees to get it then

## 2012-03-16 NOTE — Progress Notes (Signed)
  Subjective:    Patient ID: Cody Campbell, male    DOB: Feb 04, 1966, 46 y.o.   MRN: [id number]  HPI  Work in for 3-4 days of nausea and vomiting with some mild abdominal pain and headache.    Feels pain in bottom of throat, burning, sour feeling.  Has been improving.    Reports several episodes of the past few years when he gets stomach viruses.  Notes heart racing at times.  No chest pain aside from sore soreness when vomiting.  No fever.    I have reviewed patient's  PMH, FH, and Social history and Medications as related to this visit. Reports CBG's in 70's.  I have reviewed patient's  PMH, FH, and Social history and Medications as related to this visit.  Review of Systems See HPi    Objective:   Physical Exam GEN: Alert & Oriented, No acute distress, well appearing CV:  Regular Rate & Rhythm, no murmur Respiratory:  Normal work of breathing, CTAB Abd:  + BS, soft, no tenderness to palpation Ext: no pre-tibial edema        Assessment & Plan:

## 2012-03-22 ENCOUNTER — Ambulatory Visit (INDEPENDENT_AMBULATORY_CARE_PROVIDER_SITE_OTHER): Payer: 59 | Admitting: Family Medicine

## 2012-03-22 ENCOUNTER — Encounter: Payer: Self-pay | Admitting: Family Medicine

## 2012-03-22 VITALS — BP 155/97 | HR 92 | Temp 98.5°F | Ht 74.0 in | Wt 177.0 lb

## 2012-03-22 DIAGNOSIS — I10 Essential (primary) hypertension: Secondary | ICD-10-CM

## 2012-03-22 DIAGNOSIS — E119 Type 2 diabetes mellitus without complications: Secondary | ICD-10-CM

## 2012-03-22 DIAGNOSIS — Z23 Encounter for immunization: Secondary | ICD-10-CM

## 2012-03-22 DIAGNOSIS — E785 Hyperlipidemia, unspecified: Secondary | ICD-10-CM

## 2012-03-22 LAB — LDL CHOLESTEROL, DIRECT: Direct LDL: 112 mg/dL — ABNORMAL HIGH

## 2012-03-22 MED ORDER — PRAVASTATIN SODIUM 40 MG PO TABS: 40.0000 mg | ORAL_TABLET | Freq: Every day | ORAL | Status: DC

## 2012-03-22 MED ORDER — ASPIRIN 81 MG PO TABS: 81.0000 mg | ORAL_TABLET | Freq: Every day | ORAL | Status: DC

## 2012-03-22 MED ORDER — INSULIN GLARGINE 100 UNIT/ML ~~LOC~~ SOLN: 60.0000 [IU] | Freq: Every day | SUBCUTANEOUS | Status: DC

## 2012-03-22 MED ORDER — LOSARTAN POTASSIUM 50 MG PO TABS: 50.0000 mg | ORAL_TABLET | Freq: Every day | ORAL | Status: DC

## 2012-03-22 MED ORDER — GABAPENTIN 300 MG PO CAPS: 300.0000 mg | ORAL_CAPSULE | Freq: Every day | ORAL | Status: DC

## 2012-03-22 NOTE — Patient Instructions (Addendum)
Cody Campbell,  Thank you for coming in today.  For HTN: Start losartan  For DM: Continue insulin  Start nightly gabapentin for sharp pains at night. Start aspirin    See me in one month.   Dr. Armen Pickup

## 2012-03-23 NOTE — Assessment & Plan Note (Signed)
A: non compliant with ACE.  P: start cozaar.

## 2012-03-23 NOTE — Progress Notes (Signed)
Subjective:     Patient ID: Cody Campbell, male   DOB: 1966-03-23, 46 y.o.   MRN: [id number]  HPI 46 yo M presents for f/u visit to discuss the following:  1. HTN: denies CP,  SOB, LE edema. No compliant with lisinopril x many months because he feels it made him urinate too often. 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    2. DM: compliant with lantus and humolog SSI. Check CBGs, fasting 68-150. Postprandial 120s-240s. Denies vision changes, has dilated eye exam a few months ago at [facility] on [address]. He reports that it was normal. He admits to sharp pains in legs at night and trouble starting and maintaining and erection.   3. Recent GI illness: improved. Request not for missed work day 03/14/12.   4. Bilateral knots in in mid foot. L x 3 months. R for years. Nontender.   Review of Systems As per HPI    Objective:   Physical Exam BP 155/97  Pulse 92  Temp 98.5 F (36.9 C) (Oral)  Ht 6\' 2"  (1.88 m)  Wt 177 lb (80.287 kg)  BMI 22.73 kg/m2 General appearance: cooperative, alert, NAD.  Chest: SIS2 RRR, no MRG, normal WOB, CTA b/l Ext: no edema. Normal diabetic foot exam.  Feet: palpable masses at bilateral midfoot measuring 3 x 1 cm, fibrous.        Assessment and Plan:

## 2012-03-23 NOTE — Assessment & Plan Note (Addendum)
A: elevated A1c but trending down.  P: Refilled lantus.  SSI humolog  Flu shot and pneumovax today.  Start daily aspirin.  Nightly gabapentin for neuropathy.

## 2012-03-23 NOTE — Assessment & Plan Note (Signed)
LDL above goal of < 100.  Will allow for increase trial of tighter blood sugar control and exercise before initiating pharmacotherapy.

## 2012-03-25 ENCOUNTER — Encounter: Payer: Self-pay | Admitting: *Deleted

## 2012-04-11 ENCOUNTER — Encounter: Payer: Self-pay | Admitting: Home Health Services

## 2012-04-13 ENCOUNTER — Encounter: Payer: Self-pay | Admitting: Home Health Services

## 2012-06-28 ENCOUNTER — Ambulatory Visit: Payer: 59 | Admitting: Family Medicine

## 2012-08-17 ENCOUNTER — Encounter: Payer: Self-pay | Admitting: *Deleted

## 2012-10-13 ENCOUNTER — Ambulatory Visit (INDEPENDENT_AMBULATORY_CARE_PROVIDER_SITE_OTHER): Payer: 59 | Admitting: Family Medicine

## 2012-10-13 ENCOUNTER — Encounter: Payer: Self-pay | Admitting: Family Medicine

## 2012-10-13 VITALS — BP 158/105 | HR 110 | Temp 98.2°F | Ht 74.0 in | Wt 175.8 lb

## 2012-10-13 DIAGNOSIS — E1065 Type 1 diabetes mellitus with hyperglycemia: Secondary | ICD-10-CM

## 2012-10-13 DIAGNOSIS — I10 Essential (primary) hypertension: Secondary | ICD-10-CM

## 2012-10-13 DIAGNOSIS — E1165 Type 2 diabetes mellitus with hyperglycemia: Secondary | ICD-10-CM

## 2012-10-13 DIAGNOSIS — R52 Pain, unspecified: Secondary | ICD-10-CM | POA: Insufficient documentation

## 2012-10-13 DIAGNOSIS — E1149 Type 2 diabetes mellitus with other diabetic neurological complication: Secondary | ICD-10-CM

## 2012-10-13 MED ORDER — GABAPENTIN 300 MG PO CAPS: ORAL_CAPSULE | ORAL | Status: DC

## 2012-10-13 MED ORDER — LOSARTAN POTASSIUM 50 MG PO TABS: 50.0000 mg | ORAL_TABLET | Freq: Every day | ORAL | Status: DC

## 2012-10-13 NOTE — Patient Instructions (Addendum)
Start gabapentin. 1 tablet every 8 hours as needed. Watch out for sleepiness.   You may take 1-3 tablets at bedtime.    Re-start blood pressure medicine (losartan).  Follow-up with Dr. Armen Pickup next week to talk about your diabetes.

## 2012-10-13 NOTE — Assessment & Plan Note (Signed)
See body aches A/P 

## 2012-10-13 NOTE — Progress Notes (Signed)
  Subjective:    Patient ID: Cody Campbell. Valbuena, male    DOB: [birth year], 47 y.o.   MRN: [id number]  HPI # SDA. Pain, like his skin is on fire. He gets shooting and stabbing pain in legs, arms, and sometime back.  It used to be intermittent but now it is constant.  Duration: 1 week  ROS: endorses insomnia, stressors at work   Medications tried: -Kingsley Callander, OTC analgesics: does not help   Elevated blood pressure today  Review of Systems Per HPI Denies chest pain or headaches, difficulty breathing  Denies no new activities, numbness  Allergies, medication, past medical history reviewed.  Smoking status noted.  Tobacco Insomnia, impotence HTN, HLD T2DM, A1c 03/2012 10.0    He checks sugars 3-7 times a day due to concern for lability; normally in 200 range, sometimes below 70; he does not eat regular meals due to his job. Last checked sugar last night around 7 pm, and it was 240. He did not eat breakfast this morning.   Social history:  Work: Counsellor      Objective:   Physical Exam GEN: NAD; thin; well-appearing PSYCH: appears ?mildly depressed; appropriate to questions; normally conversant; alert and oriented NEURO: no focal deficits; moves all extremities well; normal gait CV: RRR PULM: NI WOB; CTAB without w/r/r MSK: diffuse tenderness throughout body; no rash; no joint swelling    Assessment & Plan:

## 2012-10-13 NOTE — Addendum Note (Signed)
Addended by: [place], Patriece Archbold on: 10/13/2012 12:34 PM   Modules accepted: Orders

## 2012-10-13 NOTE — Assessment & Plan Note (Signed)
See body aches A/P

## 2012-10-13 NOTE — Assessment & Plan Note (Signed)
D/Dx: diabetic neuropathy, fibromyalgia, depression  -He has history of diabetic neuropathy. He has gabapentin on medication list but he denies ever taking.  -Start gabapentin 300 mg tid prn. May increase bedtime dose to 600-900 mg based on how he tolerates lower doses. He also endorses insomnia so this should help with that as well.  -Work note given for next few days.  -Check HgbA1c today. His sugars seem labile and not well controlled probably contributed by inconsistent eating habits while working. He was advised schedule follow-up PCP next week to follow-up on body aches and diabetes.  -He was advised to restart losartan which he had not been taking for hypertension today and renal protection with diabetes.  -We did not discuss aspirin and statin noncompliance today. Will defer to PCP.

## 2012-10-21 ENCOUNTER — Ambulatory Visit: Payer: 59 | Admitting: Family Medicine

## 2012-11-02 ENCOUNTER — Ambulatory Visit (INDEPENDENT_AMBULATORY_CARE_PROVIDER_SITE_OTHER): Payer: 59 | Admitting: Family Medicine

## 2012-11-02 ENCOUNTER — Encounter: Payer: Self-pay | Admitting: Family Medicine

## 2012-11-02 VITALS — BP 123/92 | HR 114 | Temp 98.8°F | Ht 74.0 in | Wt 164.0 lb

## 2012-11-02 DIAGNOSIS — R112 Nausea with vomiting, unspecified: Secondary | ICD-10-CM

## 2012-11-02 MED ORDER — ONDANSETRON HCL 4 MG PO TABS: 4.0000 mg | ORAL_TABLET | Freq: Three times a day (TID) | ORAL | Status: DC | PRN

## 2012-11-02 NOTE — Progress Notes (Signed)
S: Pt comes in today for SDA for ?stomach virus. Pt reports vomiting without diarrhea for the past 3 days.  She does have known DM, last A1c 10.3.  She reports her sugar at home last night was 230, which is a little high for him (is usually 180-200).  Did not check it this AM.   Has continued to take his lantus 60 and humalog SSI.    Has vomited 20 or more times, every 10-15 minutes even after drinking water.  Took alka-seltzer to try to help with acid in his stomach.  Last vomited yesterday afternoon.  Has been able to keep down some chicken broth, no sugar added icecream.  Tried some solids yesterday but they irritated his throat.  Feels weak, still slightly nauseated.  No blood in his emesis, no coffee ground emesis, no bilious emesis.     ROS: Per HPI  History  Smoking status  . Current Every Day Smoker -- 2.00 packs/day  . Types: Cigars  Smokeless tobacco  . Not on file    O:  Filed Vitals:   11/02/12 0931  BP: 123/92  Pulse: 114  Temp: 98.8 F (37.1 C)    Gen: NAD HEENT: tongue and MM appear slightly dry, throat is erythematous but without exudate  CV: RRR, no murmur Pulm: CTA bilat, no wheezes or crackles Abd: soft, NT, nondistended, normoactive BS   A/P: 47 y.o. male p/w vomiting, now resolved -See problem list -f/u in 2-4 weeks to see PCP for DM

## 2012-11-02 NOTE — Assessment & Plan Note (Signed)
Likely due to viral GI illness vs food poisoning, now resolving.  Still with some nausea, so Zofran sent to pharmacy for PRN use.  Advised increasing fluids and bland diet while nausea continues.  CBGs are about normal for him (low 200's yesterday and normal for him is 180-200).  Encouraged him to keep list of CBGs and f/u with PCP in next few weeks to discuss his insulin regimen (A1c earlier this month during SDA was 10.3).  Do not think this vomiting was related to his DM.  Red flags for sooner f/u discussed.

## 2012-11-02 NOTE — Patient Instructions (Signed)
It was nice to meet you today.  I'm sorry you were feeling so badly.  I have sent in some nausea medicine to help-- take it as needed.  It shouldn't make you sleepy so you can use it at work if needed.  Drink plenty of fluids and eat a bland diet for the next few days-- anything that seems appealing.  Avoid very greasy or spicy foods since they tend to irritate nausea.   Come back to see Dr. Armen Pickup in the next few weeks to make sure you are feeling better. Bring a list of your sugars with you to that visit because she may need to adjust your insulin.

## 2012-11-14 ENCOUNTER — Encounter: Payer: Self-pay | Admitting: Family Medicine

## 2012-11-14 ENCOUNTER — Ambulatory Visit (INDEPENDENT_AMBULATORY_CARE_PROVIDER_SITE_OTHER): Payer: 59 | Admitting: Family Medicine

## 2012-11-14 VITALS — BP 141/95 | HR 93 | Temp 98.2°F | Ht 74.0 in | Wt 172.0 lb

## 2012-11-14 DIAGNOSIS — E1165 Type 2 diabetes mellitus with hyperglycemia: Secondary | ICD-10-CM

## 2012-11-14 DIAGNOSIS — I10 Essential (primary) hypertension: Secondary | ICD-10-CM

## 2012-11-14 DIAGNOSIS — R229 Localized swelling, mass and lump, unspecified: Secondary | ICD-10-CM | POA: Insufficient documentation

## 2012-11-14 DIAGNOSIS — R112 Nausea with vomiting, unspecified: Secondary | ICD-10-CM

## 2012-11-14 DIAGNOSIS — E119 Type 2 diabetes mellitus without complications: Secondary | ICD-10-CM

## 2012-11-14 LAB — LDL CHOLESTEROL, DIRECT: Direct LDL: 76 mg/dL

## 2012-11-14 MED ORDER — INSULIN LISPRO 100 UNIT/ML ~~LOC~~ SOLN: SUBCUTANEOUS | Status: DC

## 2012-11-14 MED ORDER — GABAPENTIN 300 MG PO CAPS: ORAL_CAPSULE | ORAL | Status: DC

## 2012-11-14 MED ORDER — LOSARTAN POTASSIUM 25 MG PO TABS: 25.0000 mg | ORAL_TABLET | Freq: Every day | ORAL | Status: DC

## 2012-11-14 NOTE — Progress Notes (Signed)
Subjective:     Patient ID: Cody Campbell, male   DOB: [birth year], 47 y.o.   MRN: [id number]  HPI 47 year old male presents for follow visit to discuss the following:  #1 gastroenteritis: patient was seen in the clinic by the partner 12 days ago for what appeared to be viral gastroenteritis. He was out of work for one week. He takes Zofran as needed for nausea. He is concerned because employers are upset that he is absent four-week time about every 3-4 months with nausea vomiting.  #2 DM 2: patient is compliant with his Lantus only. He says taking a lot as he felt he did not need it. He states his fasting blood sugars run between 75-220. He states that his postprandial blood sugars are about the same.  He's had no lows less than 75. He [facility]. He denies vision changes. He does have some numbness in his feet he was taking gabapentin for this but since run out. He like refill of gabapentin. He is noncompliant with aspirin due to GI irritation. He is noncompliant with Cozaar because he said his blood pressure was too low while taking it 94/70. He is noncompliant with statin because he is afraid of side effects.   #3 hypertension:Patient is noncompliant with medications. He denies headache, vision changes, chest pain, shortness of breath and worsening edema. He continues to smoke usually two black and mild cigars daily.   #4 foot nodules: patient with a 7 ounces on the arch of both feet. They're non tender. He does have worsening neuropathy and  is concerned that they have attributed to it. 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 Diabetic foot exam done today.   Lab Results  Component Value Date   HGBA1C 10.3 10/13/2012       Assessment:

## 2012-11-14 NOTE — Assessment & Plan Note (Signed)
Fibrous nodule bilateral feet. Nontender. Referred to podiatry definitively patient will benefit from custom orthotics.

## 2012-11-14 NOTE — Assessment & Plan Note (Signed)
A: elevated BP due to medication noncompliance. P: Restart Cozaar 25 mg daily.

## 2012-11-14 NOTE — Assessment & Plan Note (Signed)
Resolved

## 2012-11-14 NOTE — Assessment & Plan Note (Addendum)
A: declined due to medication non compliance. P: Continue Lantus to 60 units nightly. Restart Humalog with a goal of about 25 units with each meal. The patient has his own sliding scale that he prefers to adhere to. We discussed that his A1c goal is 7

## 2012-11-14 NOTE — Patient Instructions (Addendum)
Mr. Cody Campbell  thank you for coming in today.  Regarding her diabetes: Please restart gabapentin nightly. Please restart Cozaar 25 mg daily. Please restart Humalog flex pin with meals. Goal should be around 25 units per meal. He may require more to have a high carb meal.  At this referral to podiatry to evaluate the fibrous nodules on your feet  I am rechecking direct LDL checked today if it is still elevated I do recommend restarting a statin therapy.  Dr. Armen Pickup

## 2012-11-18 ENCOUNTER — Encounter: Payer: Self-pay | Admitting: Family Medicine

## 2012-11-18 NOTE — Addendum Note (Signed)
Addended by: Dessa Phi on: 11/18/2012 02:16 PM   Modules accepted: Orders

## 2012-12-08 ENCOUNTER — Other Ambulatory Visit: Payer: Self-pay

## 2013-03-16 ENCOUNTER — Ambulatory Visit (INDEPENDENT_AMBULATORY_CARE_PROVIDER_SITE_OTHER): Payer: 59 | Admitting: Family Medicine

## 2013-03-16 ENCOUNTER — Encounter: Payer: Self-pay | Admitting: Family Medicine

## 2013-03-16 VITALS — BP 159/116 | HR 115 | Temp 98.1°F | Ht 74.0 in | Wt 172.1 lb

## 2013-03-16 DIAGNOSIS — E1142 Type 2 diabetes mellitus with diabetic polyneuropathy: Secondary | ICD-10-CM

## 2013-03-16 DIAGNOSIS — E1149 Type 2 diabetes mellitus with other diabetic neurological complication: Secondary | ICD-10-CM

## 2013-03-16 DIAGNOSIS — E1165 Type 2 diabetes mellitus with hyperglycemia: Secondary | ICD-10-CM

## 2013-03-16 DIAGNOSIS — E1140 Type 2 diabetes mellitus with diabetic neuropathy, unspecified: Secondary | ICD-10-CM | POA: Insufficient documentation

## 2013-03-16 DIAGNOSIS — Z23 Encounter for immunization: Secondary | ICD-10-CM

## 2013-03-16 MED ORDER — PREGABALIN 50 MG PO CAPS: 50.0000 mg | ORAL_CAPSULE | Freq: Three times a day (TID) | ORAL | Status: DC

## 2013-03-16 MED ORDER — "INSULIN SYRINGE-NEEDLE U-100 31G X 5/16"" 1 ML MISC": Status: DC

## 2013-03-16 MED ORDER — INSULIN GLARGINE 100 UNIT/ML ~~LOC~~ SOLN: 35.0000 [IU] | Freq: Every day | SUBCUTANEOUS | Status: DC

## 2013-03-16 NOTE — Assessment & Plan Note (Signed)
Patient only taking 900 mg of Gabapentin at night (cannot take during the day due to sleepiness/drowsiness). Will discontinue Gabapentin today and switch to Lyrica.

## 2013-03-16 NOTE — Progress Notes (Signed)
Subjective:     Patient ID: Cody Campbell, male   DOB: 1966-01-01, 47 y.o.   MRN: [id number]  HPI 47 year old male with DM-2 presents for same day appointment for diabetic nerve pain.  1) DM-2 - Patient's DM is uncontrolled. - He reports that fasting sugars range from 150's-200's; CBG's prior to meals are usually elevated as well (upwards of 300's-400's). - He is noncompliant with his insulin regimen.  He is currently taking 56 units of Lantus at night and occasionally using 15-20 units of Humalog (SSI).  2) Diabetic nerve pain - He reports bilaterally foot pain  - Located in the toes and balls of the feet - Pain currently 5/10 in severity; Has gotten up to 10/10 in severity prior - Pain described as "pins and needles" - He is currently on gabapentin, which helps some.  Review of Systems Per HPI    Objective:   Physical Exam Exam: General: well appearing gentlemen in NAD. Cardiovascular: RRR. No murmurs, rubs, or gallops. Respiratory: CTAB. No rales, rhonchi, or wheeze. Abdomen: soft, nontender, nondistended. Extremities: fibrous nodules noted on the medial, plantar surface of both feet.     Assessment:    See Problem List     Plan:

## 2013-03-16 NOTE — Assessment & Plan Note (Signed)
A1C 10.8 today. Lantus increased to 35 BID. Humalog samples given (per pharmacy) Advised 20 Units of Humalog with meals.  Patient to follow up with Dr. Raymondo Band.

## 2013-03-16 NOTE — Patient Instructions (Addendum)
It was nice to see you today.  I have switched you to Lyrica regarding your Diabetic nerve pain.  Please take this as prescribed.  We can increase this as needed.   In regards to your Diabetes, I am increasing your insulin regimen today.  Take 35 units twice daily (morning and night).  Take 20 units of Humalog with both meals.   Please follow up with Dr. Raymondo Band next week.

## 2013-03-23 ENCOUNTER — Ambulatory Visit: Payer: 59 | Admitting: Pharmacist

## 2013-05-01 ENCOUNTER — Telehealth: Payer: Self-pay | Admitting: Family Medicine

## 2013-05-01 NOTE — Telephone Encounter (Signed)
At his last visit Dr Adriana Simas said to take 35 units of insulin twice a day. Prescription was written for once a day. He needs more Animal nutritionist of [employer] and [place]

## 2013-05-03 MED ORDER — INSULIN GLARGINE 100 UNIT/ML ~~LOC~~ SOLN: 35.0000 [IU] | Freq: Two times a day (BID) | SUBCUTANEOUS | Status: DC

## 2013-05-05 ENCOUNTER — Ambulatory Visit: Payer: No Typology Code available for payment source | Attending: Internal Medicine | Admitting: Internal Medicine

## 2013-05-05 VITALS — BP 90/69 | HR 78

## 2013-05-05 DIAGNOSIS — E1165 Type 2 diabetes mellitus with hyperglycemia: Secondary | ICD-10-CM

## 2013-05-05 MED ORDER — INSULIN GLARGINE 100 UNIT/ML ~~LOC~~ SOLN: 35.0000 [IU] | Freq: Two times a day (BID) | SUBCUTANEOUS | Status: DC

## 2013-05-05 NOTE — Progress Notes (Signed)
Patient came into [facility] today and was given 2 Lantus samples from [facility]. Patietn is patient over at the family practice

## 2013-05-10 NOTE — Progress Notes (Signed)
   Subjective:    Patient ID: Cody Campbell, male    DOB: Oct 20, 1965, 47 y.o.   MRN: [id number]  HPI  Patient is a 47 years old gentleman who is not our patient yet, walked into clinic today because he recently lost his insurance and fear that he may not be seen in his regular clinic at [facility], and he is going to run out of his insulin Lantus in few days. Someone advised him to come to our clinic for help. He has no significant complaint today. His medical history include hypertension, diabetes, diabetic neuropathy and hyperlipidemia. He smokes cigarettes and he drinks alcohol  Review of Systems    All systems reviewed and are negative except above Objective:   Physical Exam  Vital signs are stable Complete examination not done      Assessment & Plan:  Diabetes mellitus insulin-dependent, patient is going to run out of his medication, he does not have the financial means to get the medications, and he does not have insurance at the moment.  The clinic made available 2 vials of Lantus insulin, to continue with current regimen onto patient is probably seen in our clinic as a new patient  Patient was counseled on medication compliance, educated on hypoglycemic precautions. Patient was counseled extensively about smoking cessation Patient was counseled on nutrition and exercise  Jeanann Lewandowsky, MD  05/05/2013

## 2013-05-15 ENCOUNTER — Ambulatory Visit (HOSPITAL_COMMUNITY)
Admission: RE | Admit: 2013-05-15 | Discharge: 2013-05-15 | Disposition: A | Discharge status: Home / Self Care | Payer: No Typology Code available for payment source | Source: Ambulatory Visit | Attending: Internal Medicine | Admitting: Internal Medicine

## 2013-05-15 ENCOUNTER — Ambulatory Visit: Payer: No Typology Code available for payment source | Attending: Internal Medicine | Admitting: Internal Medicine

## 2013-05-15 VITALS — BP 107/75 | HR 105 | Temp 99.5°F | Resp 16 | Ht 74.0 in | Wt 178.0 lb

## 2013-05-15 DIAGNOSIS — E1165 Type 2 diabetes mellitus with hyperglycemia: Secondary | ICD-10-CM

## 2013-05-15 DIAGNOSIS — E119 Type 2 diabetes mellitus without complications: Secondary | ICD-10-CM

## 2013-05-15 DIAGNOSIS — F17200 Nicotine dependence, unspecified, uncomplicated: Secondary | ICD-10-CM

## 2013-05-15 DIAGNOSIS — M5136 Other intervertebral disc degeneration, lumbar region: Secondary | ICD-10-CM | POA: Insufficient documentation

## 2013-05-15 DIAGNOSIS — M51379 Other intervertebral disc degeneration, lumbosacral region without mention of lumbar back pain or lower extremity pain: Secondary | ICD-10-CM | POA: Insufficient documentation

## 2013-05-15 DIAGNOSIS — M549 Dorsalgia, unspecified: Secondary | ICD-10-CM

## 2013-05-15 DIAGNOSIS — M5137 Other intervertebral disc degeneration, lumbosacral region: Secondary | ICD-10-CM | POA: Insufficient documentation

## 2013-05-15 DIAGNOSIS — R52 Pain, unspecified: Secondary | ICD-10-CM

## 2013-05-15 DIAGNOSIS — E785 Hyperlipidemia, unspecified: Secondary | ICD-10-CM

## 2013-05-15 DIAGNOSIS — I10 Essential (primary) hypertension: Secondary | ICD-10-CM

## 2013-05-15 DIAGNOSIS — E1140 Type 2 diabetes mellitus with diabetic neuropathy, unspecified: Secondary | ICD-10-CM

## 2013-05-15 DIAGNOSIS — M47812 Spondylosis without myelopathy or radiculopathy, cervical region: Secondary | ICD-10-CM | POA: Insufficient documentation

## 2013-05-15 LAB — CBC WITH DIFFERENTIAL/PLATELET
Basophils Relative: 1 % (ref 0–1)
Eosinophils Absolute: 0.1 10*3/uL (ref 0.0–0.7)
HCT: 37.7 % — ABNORMAL LOW (ref 39.0–52.0)
Hemoglobin: 12.6 g/dL — ABNORMAL LOW (ref 13.0–17.0)
MCH: 31.1 pg (ref 26.0–34.0)
MCHC: 33.4 g/dL (ref 30.0–36.0)
Monocytes Absolute: 0.8 10*3/uL (ref 0.1–1.0)
Monocytes Relative: 13 % — ABNORMAL HIGH (ref 3–12)
Neutrophils Relative %: 60 % (ref 43–77)
RDW: 14.8 % (ref 11.5–15.5)

## 2013-05-15 LAB — COMPREHENSIVE METABOLIC PANEL
AST: 15 U/L (ref 0–37)
Alkaline Phosphatase: 62 U/L (ref 39–117)
BUN: 12 mg/dL (ref 6–23)
Glucose, Bld: 50 mg/dL — ABNORMAL LOW (ref 70–99)
Potassium: 5.2 mEq/L (ref 3.5–5.3)
Sodium: 142 mEq/L (ref 135–145)
Total Bilirubin: 0.3 mg/dL (ref 0.3–1.2)
Total Protein: 6.8 g/dL (ref 6.0–8.3)

## 2013-05-15 LAB — LIPID PANEL
HDL: 71 mg/dL (ref >39)
LDL Cholesterol: 80 mg/dL (ref 0–99)
Triglycerides: 54 mg/dL (ref <150)
VLDL: 11 mg/dL (ref 0–40)

## 2013-05-15 MED ORDER — TRAMADOL HCL 50 MG PO TABS: 50.0000 mg | ORAL_TABLET | Freq: Three times a day (TID) | ORAL | Status: DC | PRN

## 2013-05-15 MED ORDER — INSULIN LISPRO 100 UNIT/ML ~~LOC~~ SOLN: 15.0000 [IU] | Freq: Three times a day (TID) | SUBCUTANEOUS | Status: DC

## 2013-05-15 MED ORDER — INSULIN GLARGINE 100 UNIT/ML ~~LOC~~ SOLN: 35.0000 [IU] | Freq: Two times a day (BID) | SUBCUTANEOUS | Status: DC

## 2013-05-15 MED ORDER — CYCLOBENZAPRINE HCL 10 MG PO TABS: 10.0000 mg | ORAL_TABLET | Freq: Three times a day (TID) | ORAL | Status: DC | PRN

## 2013-05-15 MED ORDER — GABAPENTIN 300 MG PO CAPS: 600.0000 mg | ORAL_CAPSULE | Freq: Three times a day (TID) | ORAL | Status: DC

## 2013-05-15 NOTE — Progress Notes (Unsigned)
Patient ID: Cody Campbell. [place]in[place]y, male   DOB: 1965/07/08, 47 y.o.   MRN: [id number] Patient Demographics  Cody Campbell, is a 47 y.o. male  ION:[id number]  KGM:[id number]  DOB - 1966-05-24  Chief Complaint  Patient presents with  . Follow-up        Subjective:   Cody Campbell today is here to establish primary care.   the patient is a 47 year old male with history of nicotine abuse, diabetic neuropathy, uncontrolled diabetes, hypertension, back pain presented to the clinic to establish care. Patient had walked into the clinic last visit as he was running out of his medications. He was given a sample of Lantus and made appointment for encounter this visit. Patient reports that the blood sugars has been running high at home and he is mostly running out of all his medications. He used to follow family practice at [facility]. Patient also reports neck pain and low back pain. Patient has No headache, No chest pain, No abdominal pain - No Nausea, No new weakness tingling or numbness, No Cough - SOB  Objective:    There were no vitals filed for this visit.   ALLERGIES:  No Known Allergies  PAST MEDICAL HISTORY: Past Medical History  Diagnosis Date  . Elevated LFTs 05/21/2011  . Diabetes mellitus without complication   . Hypertension   . Hyperlipidemia     PAST SURGICAL HISTORY: History reviewed. No pertinent past surgical history.  FAMILY HISTORY: Family History  Problem Relation Age of Onset  . Diabetes Mother   . Hypertension Father   . Diabetes Father   . Heart disease Father     MEDICATIONS AT HOME: Prior to Admission medications   Medication Sig Start Date End Date Taking? Authorizing Provider  aspirin 81 MG tablet Take 1 tablet (81 mg total) by mouth daily. 03/22/12   Josalyn C Funches, MD  cyclobenzaprine (FLEXERIL) 10 MG tablet Take 1 tablet (10 mg total) by mouth 3 (three) times daily as needed for muscle spasms. 05/15/13   Ripudeep Jenna Luo, MD  gabapentin (NEURONTIN) 300  MG capsule Take 2 capsules (600 mg total) by mouth 3 (three) times daily. 05/15/13   Ripudeep Jenna Luo, MD  glucose blood (ONE TOUCH ULTRA TEST) test strip Use as instructed 07/22/11   Josalyn C Funches, MD  insulin glargine (LANTUS) 100 UNIT/ML injection Inject 0.35 mLs (35 Units total) into the skin 2 (two) times daily. 05/03/13   Anselm Lis, MD  insulin glargine (LANTUS) 100 UNIT/ML injection Inject 0.35 mLs (35 Units total) into the skin 2 (two) times daily. 05/15/13   Ripudeep Jenna Luo, MD  insulin lispro (HUMALOG) 100 UNIT/ML injection Inject 15 Units into the skin 3 (three) times daily with meals. Sliding scale as directed 05/15/13   Ripudeep K Rai, MD  Insulin Syringe-Needle U-100 (B-D INS SYR ULTRAFINE 1CC/31G) 31G X 5/16" 1 ML MISC Use for administration of insulin. 03/16/13   Tommie Sams, DO  ONE TOUCH ULTRA TEST test strip USE TO TEST THREE TIMES DAILY AS DIRECTED 08/22/11   Ivy de Lawson Radar, DO    REVIEW OF SYSTEMS:  Constitutional:   No   Fevers, chills, fatigue.  HEENT:    No headaches, Sore throat,   Cardio-vascular: No chest pain,  Orthopnea, swelling in lower extremities, anasarca, palpitations  GI:  No abdominal pain, nausea, vomiting, diarrhea  Resp: No shortness of breath,  No coughing up of blood.No cough.No wheezing.  Skin:  no rash or lesions.  GU:  no dysuria, change in color of urine, no urgency or frequency.  No flank pain.  Musculoskeletal: No joint pain or swelling.  No decreased range of motion.  No back pain.  Psych: No change in mood or affect. No depression or anxiety.  No memory loss.   Exam  General appearance :Awake, alert, NAD, Speech Clear. HEENT: Atraumatic and Normocephalic, PERLA Neck: supple, no JVD. No cervical lymphadenopathy.  Chest: clear to auscultation bilaterally, no wheezing, rales or rhonchi CVS: S1 S2 regular, no murmurs.  Abdomen: soft, NBS, NT, ND, no gaurding, rigidity or rebound. Extremities: No cyanosis, clubbing, B/L  Lower Ext shows no edema,  Neurology: Awake alert, and oriented X 3, CN II-XII intact, Non focal Skin:No Rash or lesions Wounds: N/A    Data Review   Basic Metabolic Panel: No results found for this basename: NA, K, CL, CO2, GLUCOSE, BUN, CREATININE, CALCIUM, MG, PHOS,  in the last 168 hours Liver Function Tests: No results found for this basename: AST, ALT, ALKPHOS, BILITOT, PROT, ALBUMIN,  in the last 168 hours  CBC: No results found for this basename: WBC, NEUTROABS, HGB, HCT, MCV, PLT,  in the last 168 hours ------------------------------------------------------------------------------------------------------------------ No results found for this basename: HGBA1C,  in the last 72 hours ------------------------------------------------------------------------------------------------------------------ No results found for this basename: CHOL, HDL, LDLCALC, TRIG, CHOLHDL, LDLDIRECT,  in the last 72 hours ------------------------------------------------------------------------------------------------------------------ No results found for this basename: TSH, T4TOTAL, FREET3, T3FREE, THYROIDAB,  in the last 72 hours ------------------------------------------------------------------------------------------------------------------ No results found for this basename: VITAMINB12, FOLATE, FERRITIN, TIBC, IRON, RETICCTPCT,  in the last 72 hours  Coagulation profile  No results found for this basename: INR, PROTIME,  in the last 168 hours    Assessment & Plan   Active Problems: Patient Active Problem List   Diagnosis Date Noted  . Back pain - Continue Neurontin, Flexeril, tramadol as needed  - Ordered cervical spine and lumbar spine x-rays   05/15/2013  . Diabetic neuropathy - Continue Neurontin   03/16/2013  . Fibrous nodule on the feet  - Ambulatory referral to podiatry   11/14/2012  . Hypertension:  the patient reports that he used to have hypertension and was on medications  but his BP has been running fine hence he is no longer on any antihypertensive  05/20/2011  . DM (diabetes mellitus), type 2, uncontrolled - Continue Lantus 35 units  BID, placed on Humalog 15 units 3 times a day with meals   07/29/2006  . HYPERLIPIDEMIA - Patient reports that he had hyperlipidemia however his lipids are controlled and he is no longer taking any statins   07/29/2006  . TOBACCO DEPENDENCE - Patient counseled strongly on smoking cessation  07/29/2006   Health screening - Flu shot today, CBC, CMET, lipid panel  Recommendations: Follow labs, x-rays   Follow-up in 3 months   RAI,RIPUDEEP M.D. 05/15/2013, 12:59 PM

## 2013-05-15 NOTE — Progress Notes (Unsigned)
Pt is here following up on his diabetes. Pt's blood sugar was low. Had to hold on a flu vaccine due to low blood sugar. Gave pt crackers and peanut butter; blood sugar increased to 47. Gave pt a cookie; blood sugar increased to 49. Pt had his glucose taken after sitting for 30 minutes and had a reading of 94.

## 2013-05-24 ENCOUNTER — Other Ambulatory Visit: Payer: Self-pay | Admitting: Internal Medicine

## 2013-05-24 DIAGNOSIS — E119 Type 2 diabetes mellitus without complications: Secondary | ICD-10-CM

## 2013-05-24 DIAGNOSIS — E1165 Type 2 diabetes mellitus with hyperglycemia: Secondary | ICD-10-CM

## 2013-05-24 MED ORDER — INSULIN GLARGINE 100 UNIT/ML ~~LOC~~ SOLN: 35.0000 [IU] | Freq: Two times a day (BID) | SUBCUTANEOUS | Status: DC

## 2013-05-24 MED ORDER — INSULIN LISPRO 100 UNIT/ML ~~LOC~~ SOLN: 15.0000 [IU] | Freq: Three times a day (TID) | SUBCUTANEOUS | Status: DC

## 2013-06-27 ENCOUNTER — Ambulatory Visit: Payer: No Typology Code available for payment source

## 2013-07-21 ENCOUNTER — Ambulatory Visit: Payer: No Typology Code available for payment source | Admitting: Internal Medicine

## 2013-08-14 ENCOUNTER — Ambulatory Visit: Payer: No Typology Code available for payment source | Admitting: Internal Medicine

## 2013-09-07 ENCOUNTER — Other Ambulatory Visit: Payer: Self-pay

## 2014-10-23 ENCOUNTER — Encounter: Payer: Self-pay | Admitting: Internal Medicine

## 2014-10-23 ENCOUNTER — Ambulatory Visit: Payer: Self-pay | Attending: Internal Medicine | Admitting: Internal Medicine

## 2014-10-23 VITALS — BP 137/97 | HR 76 | Temp 98.5°F | Resp 16 | Wt 160.0 lb

## 2014-10-23 DIAGNOSIS — E139 Other specified diabetes mellitus without complications: Secondary | ICD-10-CM

## 2014-10-23 DIAGNOSIS — E1310 Other specified diabetes mellitus with ketoacidosis without coma: Secondary | ICD-10-CM | POA: Insufficient documentation

## 2014-10-23 DIAGNOSIS — R631 Polydipsia: Secondary | ICD-10-CM | POA: Insufficient documentation

## 2014-10-23 DIAGNOSIS — Z794 Long term (current) use of insulin: Secondary | ICD-10-CM | POA: Insufficient documentation

## 2014-10-23 DIAGNOSIS — IMO0002 Reserved for concepts with insufficient information to code with codable children: Secondary | ICD-10-CM

## 2014-10-23 DIAGNOSIS — R358 Other polyuria: Secondary | ICD-10-CM | POA: Insufficient documentation

## 2014-10-23 DIAGNOSIS — Z7982 Long term (current) use of aspirin: Secondary | ICD-10-CM | POA: Insufficient documentation

## 2014-10-23 DIAGNOSIS — E1165 Type 2 diabetes mellitus with hyperglycemia: Secondary | ICD-10-CM

## 2014-10-23 DIAGNOSIS — F17200 Nicotine dependence, unspecified, uncomplicated: Secondary | ICD-10-CM | POA: Insufficient documentation

## 2014-10-23 LAB — POCT URINALYSIS DIPSTICK
Blood, UA: NEGATIVE
Glucose, UA: 500
Leukocytes, UA: NEGATIVE
Nitrite, UA: NEGATIVE
PH UA: 5.5
Protein, UA: 100
SPEC GRAV UA: 1.02
Urobilinogen, UA: 0.2

## 2014-10-23 LAB — GLUCOSE, POCT (MANUAL RESULT ENTRY)
POC GLUCOSE: 286 mg/dL — AB (ref 70–99)
POC Glucose: 339 mg/dl — AB (ref 70–99)

## 2014-10-23 LAB — POCT GLYCOSYLATED HEMOGLOBIN (HGB A1C): Hemoglobin A1C: 12.5

## 2014-10-23 MED ORDER — INSULIN ASPART 100 UNIT/ML ~~LOC~~ SOLN
10.0000 [IU] | Freq: Once | SUBCUTANEOUS | Status: AC
  Administered 2014-10-23: 10 [IU] via SUBCUTANEOUS

## 2014-10-23 MED ORDER — INSULIN LISPRO 100 UNIT/ML ~~LOC~~ SOLN: 15.0000 [IU] | Freq: Three times a day (TID) | SUBCUTANEOUS | Status: DC

## 2014-10-23 MED ORDER — GABAPENTIN 300 MG PO CAPS: 600.0000 mg | ORAL_CAPSULE | Freq: Three times a day (TID) | ORAL | Status: DC

## 2014-10-23 MED ORDER — INSULIN GLARGINE 100 UNIT/ML ~~LOC~~ SOLN: 35.0000 [IU] | Freq: Two times a day (BID) | SUBCUTANEOUS | Status: DC

## 2014-10-23 NOTE — Progress Notes (Signed)
Patient is refusing to do IV fluids here in office Patient states he will drink lots of water and walk around

## 2014-10-23 NOTE — Progress Notes (Signed)
Patient here for follow up on his diabetes Patient states he has been out of his humalog for a period of time Patient states he has been" getting by" on his lantus

## 2014-10-23 NOTE — Progress Notes (Signed)
MRN: [id number] Name: Cody Campbell. Cody Campbell  Sex: male Age: 49 y.o. DOB: 11-27-1965  Allergies: Review of patient's allergies indicates no known allergies.  Chief Complaint  Patient presents with  . Follow-up    diabetes    HPI: Patient is 48 y.o. male who has history of diabetes for several years, has not been seen in our office for more than a year as per patient he could not swallow because of insurance issues has been taking his Lantus but has not taken Humalog for almost a year, he does report polyuria polydipsia urinary frequency but denies any dysuria has some blurry vision, denies any chest pain or shortness of breath, today's blood sugar level was elevated he was given insulin also eventually patient was able to give the urine sample which shows ketones, patient is refusing IV fluids, would like to go back on his medications.  Past Medical History  Diagnosis Date  . Elevated LFTs 05/21/2011  . Diabetes mellitus without complication   . Hypertension   . Hyperlipidemia     History reviewed. No pertinent past surgical history.    Medication List       This list is accurate as of: 10/23/14  3:51 PM.  Always use your most recent med list.               aspirin 81 MG tablet  Take 1 tablet (81 mg total) by mouth daily.     cyclobenzaprine 10 MG tablet  Commonly known as:  FLEXERIL  Take 1 tablet (10 mg total) by mouth 3 (three) times daily as needed for muscle spasms.     gabapentin 300 MG capsule  Commonly known as:  NEURONTIN  Take 2 capsules (600 mg total) by mouth 3 (three) times daily.     glucose blood test strip  Commonly known as:  ONE TOUCH ULTRA TEST  Use as instructed     ONE TOUCH ULTRA TEST test strip  Generic drug:  glucose blood  USE TO TEST THREE TIMES DAILY AS DIRECTED     insulin glargine 100 UNIT/ML injection  Commonly known as:  LANTUS  Inject 0.35 mLs (35 Units total) into the skin 2 (two) times daily.     insulin lispro 100 UNIT/ML  injection  Commonly known as:  HUMALOG  Inject 0.15 mLs (15 Units total) into the skin 3 (three) times daily with meals. Sliding scale as directed     Insulin Syringe-Needle U-100 31G X 5/16" 1 ML Misc  Commonly known as:  B-D INS SYR ULTRAFINE 1CC/31G  Use for administration of insulin.     traMADol 50 MG tablet  Commonly known as:  ULTRAM  Take 1 tablet (50 mg total) by mouth every 8 (eight) hours as needed.        Meds ordered this encounter  Medications  . insulin aspart (novoLOG) injection 10 Units    Sig:   . gabapentin (NEURONTIN) 300 MG capsule    Sig: Take 2 capsules (600 mg total) by mouth 3 (three) times daily.    Dispense:  180 capsule    Refill:  5  . insulin lispro (HUMALOG) 100 UNIT/ML injection    Sig: Inject 0.15 mLs (15 Units total) into the skin 3 (three) times daily with meals. Sliding scale as directed    Dispense:  4 vial    Refill:  1 year  . insulin glargine (LANTUS) 100 UNIT/ML injection    Sig: Inject 0.35 mLs (35 Units total)  into the skin 2 (two) times daily.    Dispense:  4 vial    Refill:  1 year    Immunization History  Administered Date(s) Administered  . Influenza Split 03/22/2012, 05/15/2013  . Influenza,inj,Quad PF,36+ Mos 03/16/2013  . Td 06/01/1997  . Tdap 03/22/2012    Family History  Problem Relation Age of Onset  . Diabetes Mother   . Hypertension Father   . Diabetes Father   . Heart disease Father     History  Substance Use Topics  . Smoking status: Current Every Day Smoker -- 2.00 packs/day for 35 years    Types: Cigars  . Smokeless tobacco: Not on file  . Alcohol Use: Yes     Comment: occasionally, 1 beer a day    Review of Systems   As noted in HPI  Filed Vitals:   10/23/14 1302  BP: 137/97  Pulse: 76  Temp: 98.5 F (36.9 C)  Resp: 16    Physical Exam  Physical Exam  Constitutional: No distress.  Eyes: EOM are normal. Pupils are equal, round, and reactive to light.  Cardiovascular: Normal rate and  regular rhythm.   Pulmonary/Chest: Breath sounds normal. No respiratory distress. He has no wheezes. He has no rales.  Abdominal: Soft. There is no tenderness. There is no rebound.    CBC    Component Value Date/Time   WBC 5.8 05/15/2013 1307   RBC 4.05* 05/15/2013 1307   HGB 12.6* 05/15/2013 1307   HCT 37.7* 05/15/2013 1307   PLT 409* 05/15/2013 1307   MCV 93.1 05/15/2013 1307   LYMPHSABS 1.4 05/15/2013 1307   MONOABS 0.8 05/15/2013 1307   EOSABS 0.1 05/15/2013 1307   BASOSABS 0.0 05/15/2013 1307    CMP     Component Value Date/Time   NA 142 05/15/2013 1307   K 5.2 05/15/2013 1307   CL 104 05/15/2013 1307   CO2 30 05/15/2013 1307   GLUCOSE 50* 05/15/2013 1307   BUN 12 05/15/2013 1307   CREATININE 1.00 05/15/2013 1307   CREATININE 0.77 07/03/2009 2029   CALCIUM 10.1 05/15/2013 1307   PROT 6.8 05/15/2013 1307   ALBUMIN 4.6 05/15/2013 1307   AST 15 05/15/2013 1307   ALT 11 05/15/2013 1307   ALKPHOS 62 05/15/2013 1307   BILITOT 0.3 05/15/2013 1307   GFRNONAA >60 10/31/2007 1505   GFRAA  10/31/2007 1505    >60        The eGFR has been calculated using the MDRD equation. This calculation has not been validated in all clinical    Lab Results  Component Value Date/Time   CHOL 162 05/15/2013 01:07 PM    Lab Results  Component Value Date/Time   HGBA1C 12.50 10/23/2014 12:59 PM   HGBA1C 9.7 04/23/2010 03:42 PM    Lab Results  Component Value Date/Time   AST 15 05/15/2013 01:07 PM    Assessment and Plan  Other specified diabetes mellitus without complications - Plan:  Results for orders placed or performed in visit on 10/23/14  Glucose (CBG)  Result Value Ref Range   POC Glucose 339.0 (A) 70 - 99 mg/dl  HgB A1c  Result Value Ref Range   Hemoglobin A1C 12.50   POCT urinalysis dipstick  Result Value Ref Range   Color, UA yellow    Clarity, UA clear    Glucose, UA 500    Bilirubin, UA small    Ketones, UA $RemoveBef'40mg'[id number]$     Spec Grav, UA 1.020    Blood,  UA neg     pH, UA 5.5    Protein, UA 100    Urobilinogen, UA 0.2    Nitrite, UA neg    Leukocytes, UA Negative   Glucose (CBG)  Result Value Ref Range   POC Glucose 286 (A) 70 - 99 mg/dl   Uncontrolled diabetes, patient has not been on his Humalog  Insulin, has been taking Lantus, his urine  does show ketones, patient declines IV fluids, is given insulin subsequently his blood sugar is improved, we'll resume him back on his medications, advised patient for diabetes meal planning, keep the fingerstick log, patient will come back in 2 weeks for nurse visit CBG check Glucose (CBG), HgB A1c, POCT urinalysis dipstick, insulin aspart (novoLOG) injection 10 Units, insulin glargine (LANTUS) 100 UNIT/ML injection   Health Maintenance -Colonoscopy: -Pap Smear: -Mammogram: -Vaccinations:  -TdAP  -PNA (PPSV23) (one dose after 65) (or one dose before 65 if chronic conditions)  -Zoster (1 dose after 60 yrs)  -Influenza  No Follow-up on file.   This note has been created with Surveyor, quantity. Any transcriptional errors are unintentional.    Lorayne Marek, MD

## 2014-11-12 ENCOUNTER — Telehealth: Payer: Self-pay | Admitting: *Deleted

## 2014-11-12 ENCOUNTER — Ambulatory Visit: Payer: Self-pay | Attending: Internal Medicine

## 2014-11-12 MED ORDER — GLUCOSE BLOOD VI STRP: ORAL_STRIP | Status: DC

## 2014-11-12 MED ORDER — TRUE METRIX METER W/DEVICE KIT: PACK | Status: DC

## 2014-11-13 ENCOUNTER — Other Ambulatory Visit: Payer: Self-pay | Admitting: *Deleted

## 2014-11-13 DIAGNOSIS — E1165 Type 2 diabetes mellitus with hyperglycemia: Secondary | ICD-10-CM

## 2014-11-13 MED ORDER — TRUE METRIX METER W/DEVICE KIT: PACK | Status: DC

## 2014-11-13 MED ORDER — TRUEPLUS LANCETS 26G MISC: 1.0000 [drp] | Freq: Three times a day (TID) | Status: DC

## 2014-11-16 NOTE — Telephone Encounter (Signed)
Patient walked in on 11/12/14 requesting test strips True metrix glucometer and true test strips e-scribed to [facility] on 11/12/14

## 2014-11-26 ENCOUNTER — Other Ambulatory Visit: Payer: Self-pay

## 2014-12-13 ENCOUNTER — Other Ambulatory Visit: Payer: Self-pay | Admitting: Pharmacist

## 2014-12-13 ENCOUNTER — Other Ambulatory Visit: Payer: Self-pay

## 2014-12-13 ENCOUNTER — Ambulatory Visit: Payer: Self-pay

## 2014-12-13 ENCOUNTER — Ambulatory Visit: Payer: Self-pay | Attending: Internal Medicine | Admitting: *Deleted

## 2014-12-13 VITALS — BP 115/78 | HR 108 | Temp 98.2°F | Resp 16 | Ht 74.0 in | Wt 165.0 lb

## 2014-12-13 DIAGNOSIS — E1165 Type 2 diabetes mellitus with hyperglycemia: Secondary | ICD-10-CM | POA: Insufficient documentation

## 2014-12-13 DIAGNOSIS — I10 Essential (primary) hypertension: Secondary | ICD-10-CM | POA: Insufficient documentation

## 2014-12-13 DIAGNOSIS — Z720 Tobacco use: Secondary | ICD-10-CM | POA: Insufficient documentation

## 2014-12-13 DIAGNOSIS — E139 Other specified diabetes mellitus without complications: Secondary | ICD-10-CM

## 2014-12-13 DIAGNOSIS — Z794 Long term (current) use of insulin: Secondary | ICD-10-CM | POA: Insufficient documentation

## 2014-12-13 DIAGNOSIS — IMO0002 Reserved for concepts with insufficient information to code with codable children: Secondary | ICD-10-CM

## 2014-12-13 LAB — POCT URINALYSIS DIPSTICK
BILIRUBIN UA: NEGATIVE
Blood, UA: NEGATIVE
Glucose, UA: 500
Ketones, UA: 40
LEUKOCYTES UA: NEGATIVE
Nitrite, UA: NEGATIVE
PH UA: 5.5
Protein, UA: NEGATIVE
Spec Grav, UA: 1.02
UROBILINOGEN UA: 0.2

## 2014-12-13 LAB — GLUCOSE, POCT (MANUAL RESULT ENTRY)
POC GLUCOSE: 342 mg/dL — AB (ref 70–99)
POC Glucose: 357 mg/dl — AB (ref 70–99)

## 2014-12-13 MED ORDER — INSULIN LISPRO 100 UNIT/ML ~~LOC~~ SOLN: 20.0000 [IU] | Freq: Two times a day (BID) | SUBCUTANEOUS | Status: DC

## 2014-12-13 MED ORDER — INSULIN ASPART 100 UNIT/ML ~~LOC~~ SOLN
10.0000 [IU] | Freq: Once | SUBCUTANEOUS | Status: AC
  Administered 2014-12-13: 10 [IU] via SUBCUTANEOUS

## 2014-12-13 MED ORDER — INSULIN GLARGINE 100 UNIT/ML ~~LOC~~ SOLN: 60.0000 [IU] | Freq: Every day | SUBCUTANEOUS | Status: DC

## 2014-12-13 MED ORDER — INSULIN ASPART 100 UNIT/ML ~~LOC~~ SOLN: 20.0000 [IU] | Freq: Two times a day (BID) | SUBCUTANEOUS | Status: DC

## 2014-12-13 NOTE — Progress Notes (Signed)
Patient presents for BP check, CBG and record review for T2DM, however, patient did not bring log or meter. Patient given blood sugar log and instructed on use. Instructed to bring to all future visits. Med list reviewed; patient reports taking lantus 20-25 units before lunch and dinner and 10 units at midnight; did not take lantus yet today due to rushing before work Taking humalog insulin 10-15 units before lunch and dinner. Patient states he does not eat breakfast but has third meal at midnight Patient reports AM fasting blood sugars ranging 50-210 Patient reports before lunch blood sugars ranging 210-260 Patient was checking 1 hour after dinner. Instructed to check before dinner as he is on novolog. Also if checking after meal, wait at least 2 hours Patient reports before bed blood sugars ranging 180-330 When BS 48 knows to drink OJ. Also eats sandwich and feels better S/sx of hypoglycemia and immediate actions to take discussed in detail  Positive for increased thirst and urination "off and on for several years" Positive for feeling tired "on a daily basis"   CBG 118 AM fasting per patient CBG 342 Patient has not eaten today; had coffee only with 2 cream and artificial sweetener 10 units novolog insulin given SQ per protocol  UA  Glucose 500 Ketones 40 Patient refuses IV fluids. Covering provider made aware    Lab Results  Component Value Date   HGBA1C 12.50 10/23/2014   Filed Vitals:   12/13/14 1538  BP: 115/78  Pulse: 108  Temp: 98.2 F (36.8 C)  Resp: 16     Per Covering Provider: Change lantus to 60 units in AM Change humalog to novolog 20 units before lunch and dinner   Patient advised to call for med refills at least 7 days before running out so as not to go without.  Patient to return in 2 weeks for nurse visit for CBG and record review   Patient given literature on T2DM, Diabetes and Food, Diabetes and Exercise, Basic Carb Counting, Diabetes and Foot Care,  Diabetic Neuropathy, and Hypoglycemia  CBG 357 30 minutes after insulin admin. Patient discharged to home in stable condition.

## 2014-12-13 NOTE — Patient Instructions (Signed)
Diabetic Neuropathy Diabetic neuropathy is a nerve disease or nerve damage that is caused by diabetes mellitus. About half of all people with diabetes mellitus have some form of nerve damage. Nerve damage is more common in those who have had diabetes mellitus for many years and who generally have not had good control of their blood sugar (glucose) level. Diabetic neuropathy is a common complication of diabetes mellitus. There are three more common types of diabetic neuropathy and a fourth type that is less common and less understood:   Peripheral neuropathy--This is the most common type of diabetic neuropathy. It causes damage to the nerves of the feet and legs first and then eventually the hands and arms.The damage affects the ability to sense touch.  Autonomic neuropathy--This type causes damage to the autonomic nervous system, which controls the following functions:  Heartbeat.  Body temperature.  Blood pressure.  Urination.  Digestion.  Sweating.  Sexual function.  Focal neuropathy--Focal neuropathy can be painful and unpredictable and occurs most often in older adults with diabetes mellitus. It involves a specific nerve or one area and often comes on suddenly. It usually does not cause long-term problems.  Radiculoplexus neuropathy-- Sometimes called lumbosacral radiculoplexus neuropathy, radiculoplexus neuropathy affects the nerves of the thighs, hips, buttocks, or legs. It is more common in people with type 2 diabetes mellitus and in older men. It is characterized by debilitating pain, weakness, and atrophy, usually in the thigh muscles. CAUSES  The cause of peripheral, autonomic, and focal neuropathies is diabetes mellitus that is uncontrolled and high glucose levels. The cause of radiculoplexus neuropathy is unknown. However, it is thought to be caused by inflammation related to uncontrolled glucose levels. SIGNS AND SYMPTOMS  Peripheral Neuropathy Peripheral neuropathy develops  slowly over time. When the nerves of the feet and legs no longer work there may be:   Burning, stabbing, or aching pain in the legs or feet.  Inability to feel pressure or pain in your feet. This can lead to:  Thick calluses over pressure areas.  Pressure sores.  Ulcers.  Foot deformities.  Reduced ability to feel temperature changes.  Muscle weakness. Autonomic Neuropathy The symptoms of autonomic neuropathy vary depending on which nerves are affected. Symptoms may include:  Problems with digestion, such as:  Feeling sick to your stomach (nausea).  Vomiting.  Bloating.  Constipation.  Diarrhea.  Abdominal pain.  Difficulty with urination. This occurs if you lose your ability to sense when your bladder is full. Problems include:  Urine leakage (incontinence).  Inability to empty your bladder completely (retention).  Rapid or irregular heartbeat (palpitations).  Blood pressure drops when you stand up (orthostatic hypotension). When you stand up you may feel:  Dizzy.  Weak.  Faint.  In men, inability to attain and maintain an erection.  In women, vaginal dryness and problems with decreased sexual desire and arousal.  Problems with body temperature regulation.  Increased or decreased sweating. Focal Neuropathy  Abnormal eye movements or abnormal alignment of both eyes.  Weakness in the wrist.  Foot drop. This results in an inability to lift the foot properly and abnormal walking or foot movement.  Paralysis on one side of your face (Bell palsy).  Chest or abdominal pain. Radiculoplexus Neuropathy  Sudden, severe pain in your hip, thigh, or buttocks.  Weakness and wasting of thigh muscles.  Difficulty rising from a seated position.  Abdominal swelling.  Unexplained weight loss (usually more than 10 lb [4.5 kg]). DIAGNOSIS  Peripheral Neuropathy Your senses may   be tested. Sensory function testing can be done with:  A light touch using a  monofilament.  A vibration with tuning fork.  A sharp sensation with a pin prick. Other tests that can help diagnose neuropathy are:  Nerve conduction velocity. This test checks the transmission of an electrical current through a nerve.  Electromyography. This shows how muscles respond to electrical signals transmitted by nearby nerves.  Quantitative sensory testing. This is used to assess how your nerves respond to vibrations and changes in temperature. Autonomic Neuropathy Diagnosis is often based on reported symptoms. Tell your health care provider if you experience:   Dizziness.   Constipation.   Diarrhea.   Inappropriate urination or inability to urinate.   Inability to get or maintain an erection.  Tests that may be done include:   Electrocardiography or Holter monitor. These are tests that can help show problems with the heart rate or heart rhythm.   An X-ray exam may be done. Focal Neuropathy Diagnosis is made based on your symptoms and what your health care provider finds during your exam. Other tests may be done. They may include:  Nerve conduction velocities. This checks the transmission of electrical current through a nerve.  Electromyography. This shows how muscles respond to electrical signals transmitted by nearby nerves.  Quantitative sensory testing. This test is used to assess how your nerves respond to vibration and changes in temperature. Radiculoplexus Neuropathy  Often the first thing is to eliminate any other issue or problems that might be the cause, as there is no stick test for diagnosis.  X-ray exam of your spine and lumbar region.  Spinal tap to rule out cancer.  MRI to rule out other lesions. TREATMENT  Once nerve damage occurs, it cannot be reversed. The goal of treatment is to keep the disease or nerve damage from getting worse and affecting more nerve fibers. Controlling your blood glucose level is the key. Most people with  radiculoplexus neuropathy see at least a partial improvement over time. You will need to keep your blood glucose and HbA1c levels in the target range determined by your health care provider. Things that help control blood glucose levels include:   Blood glucose monitoring.   Meal planning.   Physical activity.   Diabetes medicine.  Over time, maintaining lower blood glucose levels helps lessen symptoms. Sometimes, prescription pain medicine is needed. HOME CARE INSTRUCTIONS:  Do not smoke.  Keep your blood glucose level in the range that you and your health care provider have determined acceptable for you.  Keep your blood pressure level in the range that you and your health care provider have determined acceptable for you.  Eat a well-balanced diet.  Be active every day.  Check your feet every day. SEEK MEDICAL CARE IF:   You have burning, stabbing, or aching pain in the legs or feet.  You are unable to feel pressure or pain in your feet.  You develop problems with digestion such as:  Nausea.  Vomiting.  Bloating.  Constipation.  Diarrhea.  Abdominal pain.  You have difficulty with urination, such as:  Incontinence.  Retention.  You have palpitations.  You develop orthostatic hypotension. When you stand up you may feel:  Dizzy.  Weak.  Faint.  You cannot attain and maintain an erection (in men).  You have vaginal dryness and problems with decreased sexual desire and arousal (in women).  You have severe pain in your thighs, legs, or buttocks.  You have unexplained weight loss.   Document Released: 07/27/2001 Document Revised: 03/08/2013 Document Reviewed: 10/27/2012 [facility] Patient Information 2015 [place], [place]. This information is not intended to replace advice given to you by your health care provider. Make sure you discuss any questions you have with your health care provider. Diabetes and Foot Care Diabetes may cause you to have problems  because of poor blood supply (circulation) to your feet and legs. This may cause the skin on your feet to become thinner, break easier, and heal more slowly. Your skin may become dry, and the skin may peel and crack. You may also have nerve damage in your legs and feet causing decreased feeling in them. You may not notice minor injuries to your feet that could lead to infections or more serious problems. Taking care of your feet is one of the most important things you can do for yourself.  HOME CARE INSTRUCTIONS  Wear shoes at all times, even in the house. Do not go barefoot. Bare feet are easily injured.  Check your feet daily for blisters, cuts, and redness. If you cannot see the bottom of your feet, use a mirror or ask someone for help.  Wash your feet with warm water (do not use hot water) and mild soap. Then pat your feet and the areas between your toes until they are completely dry. Do not soak your feet as this can dry your skin.  Apply a moisturizing lotion or petroleum jelly (that does not contain alcohol and is unscented) to the skin on your feet and to dry, brittle toenails. Do not apply lotion between your toes.  Trim your toenails straight across. Do not dig under them or around the cuticle. File the edges of your nails with an emery board or nail file.  Do not cut corns or calluses or try to remove them with medicine.  Wear clean socks or stockings every day. Make sure they are not too tight. Do not wear knee-high stockings since they may decrease blood flow to your legs.  Wear shoes that fit properly and have enough cushioning. To break in new shoes, wear them for just a few hours a day. This prevents you from injuring your feet. Always look in your shoes before you put them on to be sure there are no objects inside.  Do not cross your legs. This may decrease the blood flow to your feet.  If you find a minor scrape, cut, or break in the skin on your feet, keep it and the skin  around it clean and dry. These areas may be cleansed with mild soap and water. Do not cleanse the area with peroxide, alcohol, or iodine.  When you remove an adhesive bandage, be sure not to damage the skin around it.  If you have a wound, look at it several times a day to make sure it is healing.  Do not use heating pads or hot water bottles. They may burn your skin. If you have lost feeling in your feet or legs, you may not know it is happening until it is too late.  Make sure your health care provider performs a complete foot exam at least annually or more often if you have foot problems. Report any cuts, sores, or bruises to your health care provider immediately. SEEK MEDICAL CARE IF:   You have an injury that is not healing.  You have cuts or breaks in the skin.  You have an ingrown nail.  You notice redness on your legs or feet.  You feel burning or tingling in your legs or feet.  You have pain or cramps in your legs and feet.  Your legs or feet are numb.  Your feet always feel cold. SEEK IMMEDIATE MEDICAL CARE IF:   There is increasing redness, swelling, or pain in or around a wound.  There is a red line that goes up your leg.  Pus is coming from a wound.  You develop a fever or as directed by your health care provider.  You notice a bad smell coming from an ulcer or wound. Document Released: 05/15/2000 Document Revised: 01/18/2013 Document Reviewed: 10/25/2012 [facility] Patient Information 2015 [place], [place]. This information is not intended to replace advice given to you by your health care provider. Make sure you discuss any questions you have with your health care provider. Diabetes and Exercise Exercising regularly is important. It is not just about losing weight. It has many health benefits, such as:  Improving your overall fitness, flexibility, and endurance.  Increasing your bone density.  Helping with weight control.  Decreasing your body  fat.  Increasing your muscle strength.  Reducing stress and tension.  Improving your overall health. People with diabetes who exercise gain additional benefits because exercise:  Reduces appetite.  Improves the body's use of blood sugar (glucose).  Helps lower or control blood glucose.  Decreases blood pressure.  Helps control blood lipids (such as cholesterol and triglycerides).  Improves the body's use of the hormone insulin by:  Increasing the body's insulin sensitivity.  Reducing the body's insulin needs.  Decreases the risk for heart disease because exercising:  Lowers cholesterol and triglycerides levels.  Increases the levels of good cholesterol (such as high-density lipoproteins [HDL]) in the body.  Lowers blood glucose levels. YOUR ACTIVITY PLAN  Choose an activity that you enjoy and set realistic goals. Your health care provider or diabetes educator can help you make an activity plan that works for you. Exercise regularly as directed by your health care provider. This includes:  Performing resistance training twice a week such as push-ups, sit-ups, lifting weights, or using resistance bands.  Performing 150 minutes of cardio exercises each week such as walking, running, or playing sports.  Staying active and spending no more than 90 minutes at one time being inactive. Even short bursts of exercise are good for you. Three 10-minute sessions spread throughout the day are just as beneficial as a single 30-minute session. Some exercise ideas include:  Taking the dog for a walk.  Taking the stairs instead of the elevator.  Dancing to your favorite song.  Doing an exercise video.  Doing your favorite exercise with a friend. RECOMMENDATIONS FOR EXERCISING WITH TYPE 1 OR TYPE 2 DIABETES   Check your blood glucose before exercising. If blood glucose levels are greater than 240 mg/dL, check for urine ketones. Do not exercise if ketones are present.  Avoid  injecting insulin into areas of the body that are going to be exercised. For example, avoid injecting insulin into:  The arms when playing tennis.  The legs when jogging.  Keep a record of:  Food intake before and after you exercise.  Expected peak times of insulin action.  Blood glucose levels before and after you exercise.  The type and amount of exercise you have done.  Review your records with your health care provider. Your health care provider will help you to develop guidelines for adjusting food intake and insulin amounts before and after exercising.  If you take insulin or  oral hypoglycemic agents, watch for signs and symptoms of hypoglycemia. They include:  Dizziness.  Shaking.  Sweating.  Chills.  Confusion.  Drink plenty of water while you exercise to prevent dehydration or heat stroke. Body water is lost during exercise and must be replaced.  Talk to your health care provider before starting an exercise program to make sure it is safe for you. Remember, almost any type of activity is better than none. Document Released: 08/08/2003 Document Revised: 10/02/2013 Document Reviewed: 10/25/2012 [facility] Patient Information 2015 [place], [place]. This information is not intended to replace advice given to you by your health care provider. Make sure you discuss any questions you have with your health care provider. Diabetes Mellitus and Food It is important for you to manage your blood sugar (glucose) level. Your blood glucose level can be greatly affected by what you eat. Eating healthier foods in the appropriate amounts throughout the day at about the same time each day will help you control your blood glucose level. It can also help slow or prevent worsening of your diabetes mellitus. Healthy eating may even help you improve the level of your blood pressure and reach or maintain a healthy weight.  HOW CAN FOOD AFFECT ME? Carbohydrates Carbohydrates affect your blood  glucose level more than any other type of food. Your dietitian will help you determine how many carbohydrates to eat at each meal and teach you how to count carbohydrates. Counting carbohydrates is important to keep your blood glucose at a healthy level, especially if you are using insulin or taking certain medicines for diabetes mellitus. Alcohol Alcohol can cause sudden decreases in blood glucose (hypoglycemia), especially if you use insulin or take certain medicines for diabetes mellitus. Hypoglycemia can be a life-threatening condition. Symptoms of hypoglycemia (sleepiness, dizziness, and disorientation) are similar to symptoms of having too much alcohol.  If your health care provider has given you approval to drink alcohol, do so in moderation and use the following guidelines:  Women should not have more than one drink per day, and men should not have more than two drinks per day. One drink is equal to:  12 oz of beer.  5 oz of wine.  1 oz of hard liquor.  Do not drink on an empty stomach.  Keep yourself hydrated. Have water, diet soda, or unsweetened iced tea.  Regular soda, juice, and other mixers might contain a lot of carbohydrates and should be counted. WHAT FOODS ARE NOT RECOMMENDED? As you make food choices, it is important to remember that all foods are not the same. Some foods have fewer nutrients per serving than other foods, even though they might have the same number of calories or carbohydrates. It is difficult to get your body what it needs when you eat foods with fewer nutrients. Examples of foods that you should avoid that are high in calories and carbohydrates but low in nutrients include:  Trans fats (most processed foods list trans fats on the Nutrition Facts label).  Regular soda.  Juice.  Candy.  Sweets, such as cake, pie, doughnuts, and cookies.  Fried foods. WHAT FOODS CAN I EAT? Have nutrient-rich foods, which will nourish your body and keep you healthy.  The food you should eat also will depend on several factors, including:  The calories you need.  The medicines you take.  Your weight.  Your blood glucose level.  Your blood pressure level.  Your cholesterol level. You also should eat a variety of foods, including:  Protein,  such as meat, poultry, fish, tofu, nuts, and seeds (lean animal proteins are best).  Fruits.  Vegetables.  Dairy products, such as milk, cheese, and yogurt (low fat is best).  Breads, grains, pasta, cereal, rice, and beans.  Fats such as olive oil, trans fat-free margarine, canola oil, avocado, and olives. DOES EVERYONE WITH DIABETES MELLITUS HAVE THE SAME MEAL PLAN? Because every person with diabetes mellitus is different, there is not one meal plan that works for everyone. It is very important that you meet with a dietitian who will help you create a meal plan that is just right for you. Document Released: 02/12/2005 Document Revised: 05/23/2013 Document Reviewed: 04/14/2013 [facility] Patient Information 2015 [place], [place]. This information is not intended to replace advice given to you by your health care provider. Make sure you discuss any questions you have with your health care provider. Basic Carbohydrate Counting for Diabetes Mellitus Carbohydrate counting is a method for keeping track of the amount of carbohydrates you eat. Eating carbohydrates naturally increases the level of sugar (glucose) in your blood, so it is important for you to know the amount that is okay for you to have in every meal. Carbohydrate counting helps keep the level of glucose in your blood within normal limits. The amount of carbohydrates allowed is different for every person. A dietitian can help you calculate the amount that is right for you. Once you know the amount of carbohydrates you can have, you can count the carbohydrates in the foods you want to eat. Carbohydrates are found in the following foods:  Grains, such as  breads and cereals.  Dried beans and soy products.  Starchy vegetables, such as potatoes, peas, and corn.  Fruit and fruit juices.  Milk and yogurt.  Sweets and snack foods, such as cake, cookies, candy, chips, soft drinks, and fruit drinks. CARBOHYDRATE COUNTING There are two ways to count the carbohydrates in your food. You can use either of the methods or a combination of both. Reading the "Nutrition Facts" on Packaged Food The "Nutrition Facts" is an area that is included on the labels of almost all packaged food and beverages in the [place]. It includes the serving size of that food or beverage and information about the nutrients in each serving of the food, including the grams (g) of carbohydrate per serving.  Decide the number of servings of this food or beverage that you will be able to eat or drink. Multiply that number of servings by the number of grams of carbohydrate that is listed on the label for that serving. The total will be the amount of carbohydrates you will be having when you eat or drink this food or beverage. Learning Standard Serving Sizes of Food When you eat food that is not packaged or does not include "Nutrition Facts" on the label, you need to measure the servings in order to count the amount of carbohydrates.A serving of most carbohydrate-rich foods contains about 15 g of carbohydrates. The following list includes serving sizes of carbohydrate-rich foods that provide 15 g ofcarbohydrate per serving:   1 slice of bread (1 oz) or 1 six-inch tortilla.    of a hamburger bun or English muffin.  4-6 crackers.   cup unsweetened dry cereal.    cup hot cereal.   cup rice or pasta.    cup mashed potatoes or  of a large baked potato.  1 cup fresh fruit or one small piece of fruit.    cup canned or frozen  fruit or fruit juice.  1 cup milk.   cup plain fat-free yogurt or yogurt sweetened with artificial sweeteners.   cup cooked dried beans  or starchy vegetable, such as peas, corn, or potatoes.  Decide the number of standard-size servings that you will eat. Multiply that number of servings by 15 (the grams of carbohydrates in that serving). For example, if you eat 2 cups of strawberries, you will have eaten 2 servings and 30 g of carbohydrates (2 servings x 15 g = 30 g). For foods such as soups and casseroles, in which more than one food is mixed in, you will need to count the carbohydrates in each food that is included. EXAMPLE OF CARBOHYDRATE COUNTING Sample Dinner  3 oz chicken breast.   cup of brown rice.   cup of corn.  1 cup milk.   1 cup strawberries with sugar-free whipped topping.  Carbohydrate Calculation Step 1: Identify the foods that contain carbohydrates:   Rice.   Corn.   Milk.   Strawberries. Step 2:Calculate the number of servings eaten of each:   2 servings of rice.   1 serving of corn.   1 serving of milk.   1 serving of strawberries. Step 3: Multiply each of those number of servings by 15 g:   2 servings of rice x 15 g = 30 g.   1 serving of corn x 15 g = 15 g.   1 serving of milk x 15 g = 15 g.   1 serving of strawberries x 15 g = 15 g. Step 4: Add together all of the amounts to find the total grams of carbohydrates eaten: 30 g + 15 g + 15 g + 15 g = 75 g. Document Released: 05/18/2005 Document Revised: 10/02/2013 Document Reviewed: 04/14/2013 [facility] Patient Information 2015 [place], [place]. This information is not intended to replace advice given to you by your health care provider. Make sure you discuss any questions you have with your health care provider. Type 2 Diabetes Mellitus Type 2 diabetes mellitus, often simply referred to as type 2 diabetes, is a long-lasting (chronic) disease. In type 2 diabetes, the pancreas does not make enough insulin (a hormone), the cells are less responsive to the insulin that is made (insulin resistance), or both. Normally, insulin  moves sugars from food into the tissue cells. The tissue cells use the sugars for energy. The lack of insulin or the lack of normal response to insulin causes excess sugars to build up in the blood instead of going into the tissue cells. As a result, high blood sugar (hyperglycemia) develops. The effect of high sugar (glucose) levels can cause many complications. Type 2 diabetes was also previously called adult-onset diabetes, but it can occur at any age.  RISK FACTORS  A person is predisposed to developing type 2 diabetes if someone in the family has the disease and also has one or more of the following primary risk factors:  Overweight.  An inactive lifestyle.  A history of consistently eating high-calorie foods. Maintaining a normal weight and regular physical activity can reduce the chance of developing type 2 diabetes. SYMPTOMS  A person with type 2 diabetes may not show symptoms initially. The symptoms of type 2 diabetes appear slowly. The symptoms include:  Increased thirst (polydipsia).  Increased urination (polyuria).  Increased urination during the night (nocturia).  Weight loss. This weight loss may be rapid.  Frequent, recurring infections.  Tiredness (fatigue).  Weakness.  Vision changes, such as  blurred vision.  Fruity smell to your breath.  Abdominal pain.  Nausea or vomiting.  Cuts or bruises which are slow to heal.  Tingling or numbness in the hands or feet. DIAGNOSIS Type 2 diabetes is frequently not diagnosed until complications of diabetes are present. Type 2 diabetes is diagnosed when symptoms or complications are present and when blood glucose levels are increased. Your blood glucose level may be checked by one or more of the following blood tests:  A fasting blood glucose test. You will not be allowed to eat for at least 8 hours before a blood sample is taken.  A random blood glucose test. Your blood glucose is checked at any time of the day  regardless of when you ate.  A hemoglobin A1c blood glucose test. A hemoglobin A1c test provides information about blood glucose control over the previous 3 months.  An oral glucose tolerance test (OGTT). Your blood glucose is measured after you have not eaten (fasted) for 2 hours and then after you drink a glucose-containing beverage. TREATMENT   You may need to take insulin or diabetes medicine daily to keep blood glucose levels in the desired range.  If you use insulin, you may need to adjust the dosage depending on the carbohydrates that you eat with each meal or snack. The treatment goal is to maintain the before meal blood sugar (preprandial glucose) level at 70-130 mg/dL. HOME CARE INSTRUCTIONS   Have your hemoglobin A1c level checked twice a year.  Perform daily blood glucose monitoring as directed by your health care provider.  Monitor urine ketones when you are ill and as directed by your health care provider.  Take your diabetes medicine or insulin as directed by your health care provider to maintain your blood glucose levels in the desired range.  Never run out of diabetes medicine or insulin. It is needed every day.  If you are using insulin, you may need to adjust the amount of insulin given based on your intake of carbohydrates. Carbohydrates can raise blood glucose levels but need to be included in your diet. Carbohydrates provide vitamins, minerals, and fiber which are an essential part of a healthy diet. Carbohydrates are found in fruits, vegetables, whole grains, dairy products, legumes, and foods containing added sugars.  Eat healthy foods. You should make an appointment to see a registered dietitian to help you create an eating plan that is right for you.  Lose weight if you are overweight.  Carry a medical alert card or wear your medical alert jewelry.  Carry a 15-gram carbohydrate snack with you at all times to treat low blood glucose (hypoglycemia). Some  examples of 15-gram carbohydrate snacks include:  Glucose tablets, 3 or 4.  Glucose gel, 15-gram tube.  Raisins, 2 tablespoons (24 grams).  Jelly beans, 6.  Animal crackers, 8.  Regular pop, 4 ounces (120 mL).  Gummy treats, 9.  Recognize hypoglycemia. Hypoglycemia occurs with blood glucose levels of 70 mg/dL and below. The risk for hypoglycemia increases when fasting or skipping meals, during or after intense exercise, and during sleep. Hypoglycemia symptoms can include:  Tremors or shakes.  Decreased ability to concentrate.  Sweating.  Increased heart rate.  Headache.  Dry mouth.  Hunger.  Irritability.  Anxiety.  Restless sleep.  Altered speech or coordination.  Confusion.  Treat hypoglycemia promptly. If you are alert and able to safely swallow, follow the 15:15 rule:  Take 15-20 grams of rapid-acting glucose or carbohydrate. Rapid-acting options include glucose gel, glucose  tablets, or 4 ounces (120 mL) of fruit juice, regular soda, or low-fat milk.  Check your blood glucose level 15 minutes after taking the glucose.  Take 15-20 grams more of glucose if the repeat blood glucose level is still 70 mg/dL or below.  Eat a meal or snack within 1 hour once blood glucose levels return to normal.  Be alert to feeling very thirsty and urinating more frequently than usual, which are early signs of hyperglycemia. An early awareness of hyperglycemia allows for prompt treatment. Treat hyperglycemia as directed by your health care provider.  Engage in at least 150 minutes of moderate-intensity physical activity a week, spread over at least 3 days of the week or as directed by your health care provider. In addition, you should engage in resistance exercise at least 2 times a week or as directed by your health care provider. Try to spend no more than 90 minutes at one time inactive.  Adjust your medicine and food intake as needed if you start a new exercise or  sport.  Follow your sick-day plan anytime you are unable to eat or drink as usual.  Do not use any tobacco products including cigarettes, chewing tobacco, or electronic cigarettes. If you need help quitting, ask your health care provider.  Limit alcohol intake to no more than 1 drink per day for nonpregnant women and 2 drinks per day for men. You should drink alcohol only when you are also eating food. Talk with your health care provider whether alcohol is safe for you. Tell your health care provider if you drink alcohol several times a week.  Keep all follow-up visits as directed by your health care provider. This is important.  Schedule an eye exam soon after the diagnosis of type 2 diabetes and then annually.  Perform daily skin and foot care. Examine your skin and feet daily for cuts, bruises, redness, nail problems, bleeding, blisters, or sores. A foot exam by a health care provider should be done annually.  Brush your teeth and gums at least twice a day and floss at least once a day. Follow up with your dentist regularly.  Share your diabetes management plan with your workplace or school.  Stay up-to-date with immunizations. It is recommended that people with diabetes who are over 83 years old get the pneumonia vaccine. In some cases, two separate shots may be given. Ask your health care provider if your pneumonia vaccination is up-to-date.  Learn to manage stress.  Obtain ongoing diabetes education and support as needed.  Participate in or seek rehabilitation as needed to maintain or improve independence and quality of life. Request a physical or occupational therapy referral if you are having foot or hand numbness, or difficulties with grooming, dressing, eating, or physical activity. SEEK MEDICAL CARE IF:   You are unable to eat food or drink fluids for more than 6 hours.  You have nausea and vomiting for more than 6 hours.  Your blood glucose level is over 240 mg/dL.  There  is a change in mental status.  You develop an additional serious illness.  You have diarrhea for more than 6 hours.  You have been sick or have had a fever for a couple of days and are not getting better.  You have pain during any physical activity.  SEEK IMMEDIATE MEDICAL CARE IF:  You have difficulty breathing.  You have moderate to large ketone levels. MAKE SURE YOU:  Understand these instructions.  Will watch your condition.  Will get help right away if you are not doing well or get worse. Document Released: 05/18/2005 Document Revised: 10/02/2013 Document Reviewed: 12/15/2011 [facility] Patient Information 2015 [place], [place]. This information is not intended to replace advice given to you by your health care provider. Make sure you discuss any questions you have with your health care provider.

## 2014-12-26 ENCOUNTER — Other Ambulatory Visit: Payer: Self-pay | Admitting: Internal Medicine

## 2014-12-26 DIAGNOSIS — E139 Other specified diabetes mellitus without complications: Secondary | ICD-10-CM

## 2014-12-26 MED ORDER — INSULIN ASPART 100 UNIT/ML ~~LOC~~ SOLN: 20.0000 [IU] | Freq: Two times a day (BID) | SUBCUTANEOUS | Status: DC

## 2014-12-26 MED ORDER — INSULIN GLARGINE 100 UNIT/ML ~~LOC~~ SOLN: 60.0000 [IU] | Freq: Every day | SUBCUTANEOUS | Status: DC

## 2015-01-07 ENCOUNTER — Ambulatory Visit: Payer: Self-pay | Attending: Internal Medicine | Admitting: *Deleted

## 2015-01-07 VITALS — BP 125/81 | HR 99 | Temp 98.5°F | Resp 16 | Ht 74.0 in | Wt 174.2 lb

## 2015-01-07 DIAGNOSIS — E1165 Type 2 diabetes mellitus with hyperglycemia: Secondary | ICD-10-CM | POA: Insufficient documentation

## 2015-01-07 DIAGNOSIS — Z794 Long term (current) use of insulin: Secondary | ICD-10-CM | POA: Insufficient documentation

## 2015-01-07 DIAGNOSIS — F17210 Nicotine dependence, cigarettes, uncomplicated: Secondary | ICD-10-CM | POA: Insufficient documentation

## 2015-01-07 DIAGNOSIS — I10 Essential (primary) hypertension: Secondary | ICD-10-CM | POA: Insufficient documentation

## 2015-01-07 LAB — GLUCOSE, POCT (MANUAL RESULT ENTRY): POC GLUCOSE: 253 mg/dL — AB (ref 70–99)

## 2015-01-07 NOTE — Progress Notes (Signed)
Patient presents for CBG and record review for T2DM, however, patient did not bring BS log. States he lost log given at last visit. Patient given blood sugar log and instructed on use. Instructed to bring to all future visits. Taking lantus 35 units bid and novolog 10 units AC instead of as directed at last visit Patient reports AM fasting blood sugars ranging 65-105 Patient reports before lunch blood sugars ranging 110-135 Patient reports before dinner blood sugars ranging 90-210 Patient reports before bed blood sugars ranging 70-120 generally eating snack at this time Denies increased thirst, blurred vision  Positive for increased urination when BS > 200 Woke this AM with "shakes" felt "fuzzy in the head" states CBG 45 Drank cup of OJ, had 2 eggs and half an apple turnover, went back to sleep without rechecking BS Rechecked several hours later and BS 125. Patient held lantus and humalog doses  CBG 253 1.5 hours after coffee with 2 creamers. Patient states he took 10 units humalog insulin 30 minutes ago. PCP made aware Lab Results  Component Value Date   HGBA1C 12.50 10/23/2014   Filed Vitals:   01/07/15 1538  BP: 125/81  Pulse: 99  Temp: 98.5 F (36.9 C)  Resp: 16    Per PCP: Continue lantus and novolog as patient is taking and return for f/u with PCP in 2 weeks  Patient advised to call for med refills at least 7 days before running out so as not to go without.

## 2015-01-23 ENCOUNTER — Ambulatory Visit: Payer: Self-pay | Attending: Internal Medicine

## 2015-01-23 VITALS — Ht 74.0 in | Wt 179.0 lb

## 2015-01-23 DIAGNOSIS — IMO0002 Reserved for concepts with insufficient information to code with codable children: Secondary | ICD-10-CM

## 2015-01-23 DIAGNOSIS — E1165 Type 2 diabetes mellitus with hyperglycemia: Secondary | ICD-10-CM

## 2015-01-23 LAB — GLUCOSE, POCT (MANUAL RESULT ENTRY): POC Glucose: 110 mg/dl — AB (ref 70–99)

## 2015-01-23 LAB — POCT GLYCOSYLATED HEMOGLOBIN (HGB A1C): HEMOGLOBIN A1C: 10

## 2015-01-23 NOTE — Progress Notes (Signed)
Patient here for CBG check.   Patient does not have log but does have glucometer. Patient reports sugars range between 65-200, depending on how close to last meal.   Glucometer readings include 70 this AM, 175, 119, 110, 133, 159, 301 (right after meat and potatoes meal), 160.   Patient reports taking medications as prescribed.

## 2015-03-29 IMAGING — CR DG CERVICAL SPINE COMPLETE 4+V
6 series · 6 of 6 positions shown · non-contrast
Comparison: none

CLINICAL DATA: Neck pain.  Extension into the fingers bilaterally.

EXAM:
CERVICAL SPINE  4+ VIEWS

[w c-spine lat]
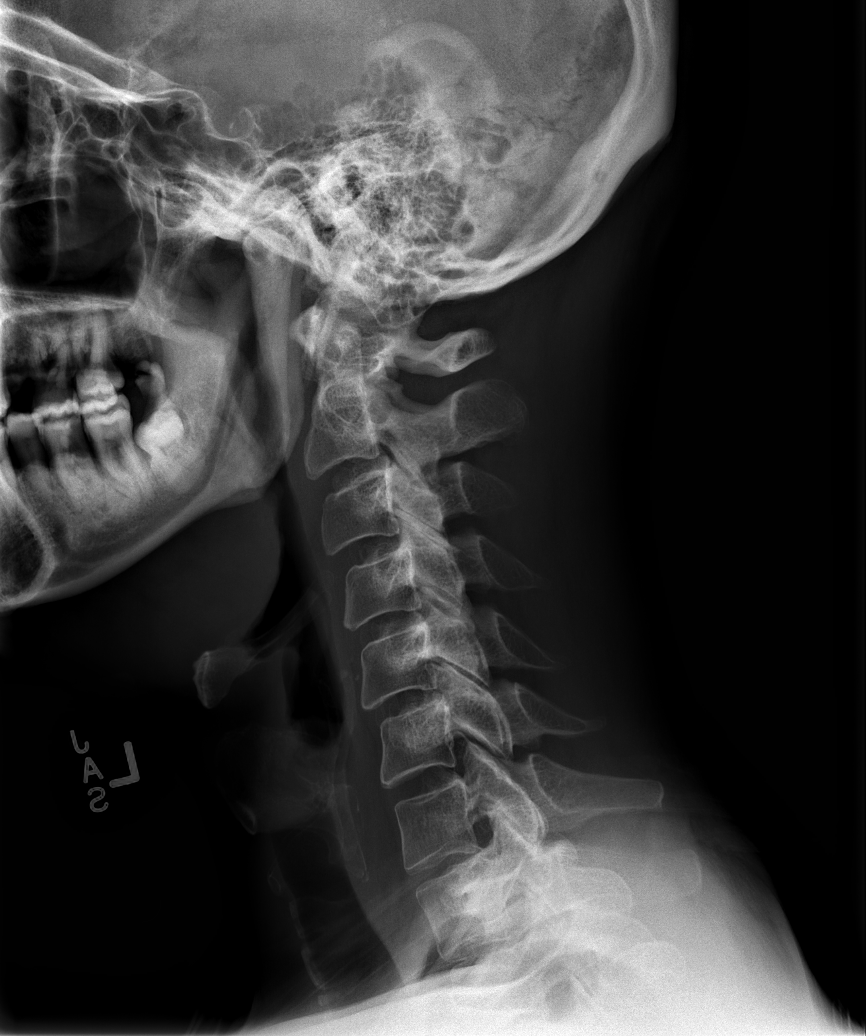

[w c-spine oblique (1 of 2)]
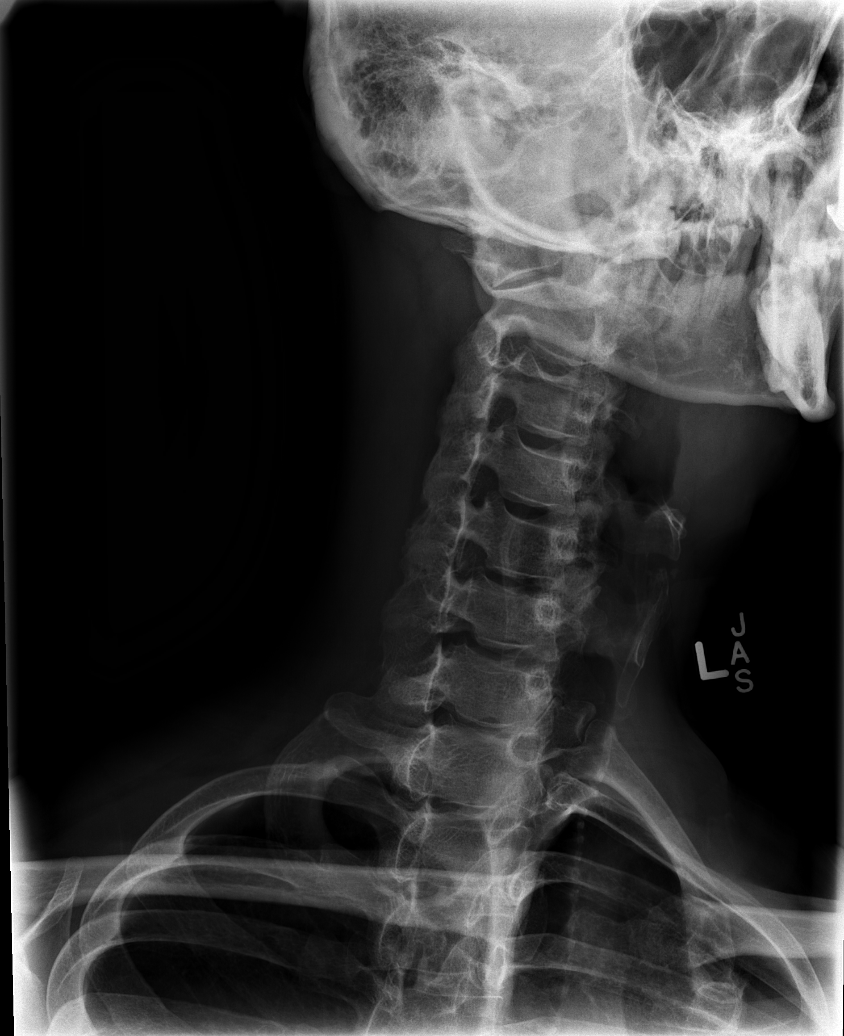

[w c-spine oblique (2 of 2)]
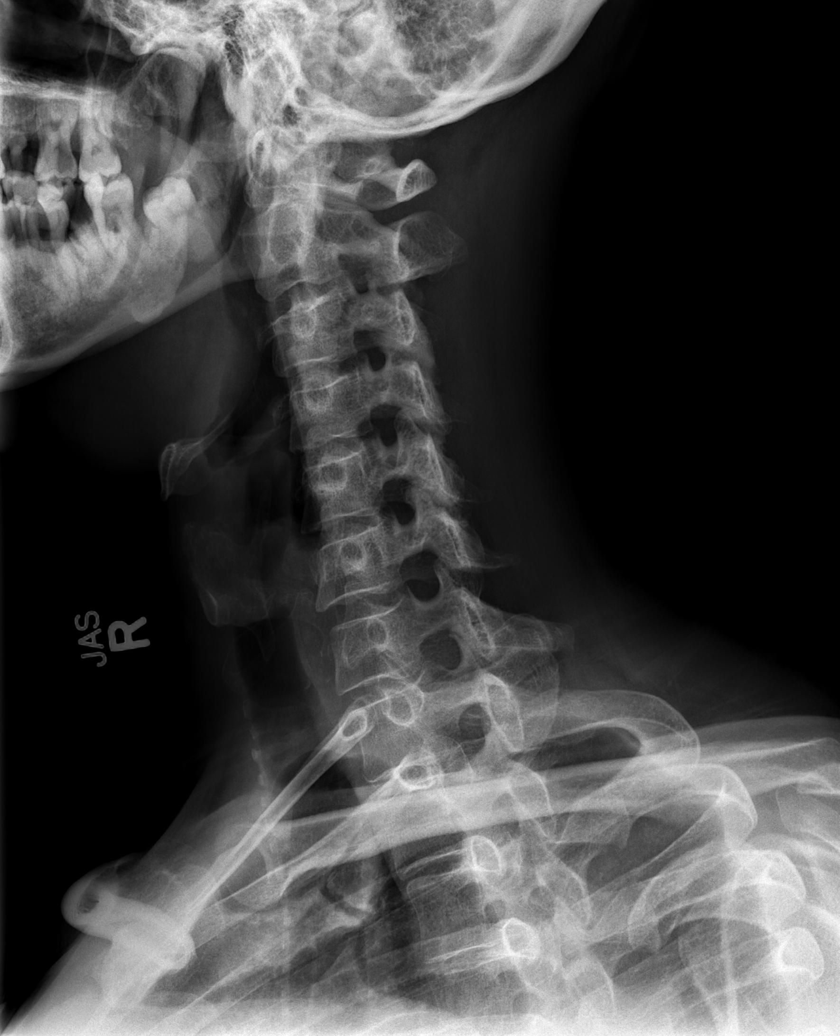

[w c-spine a.p.]
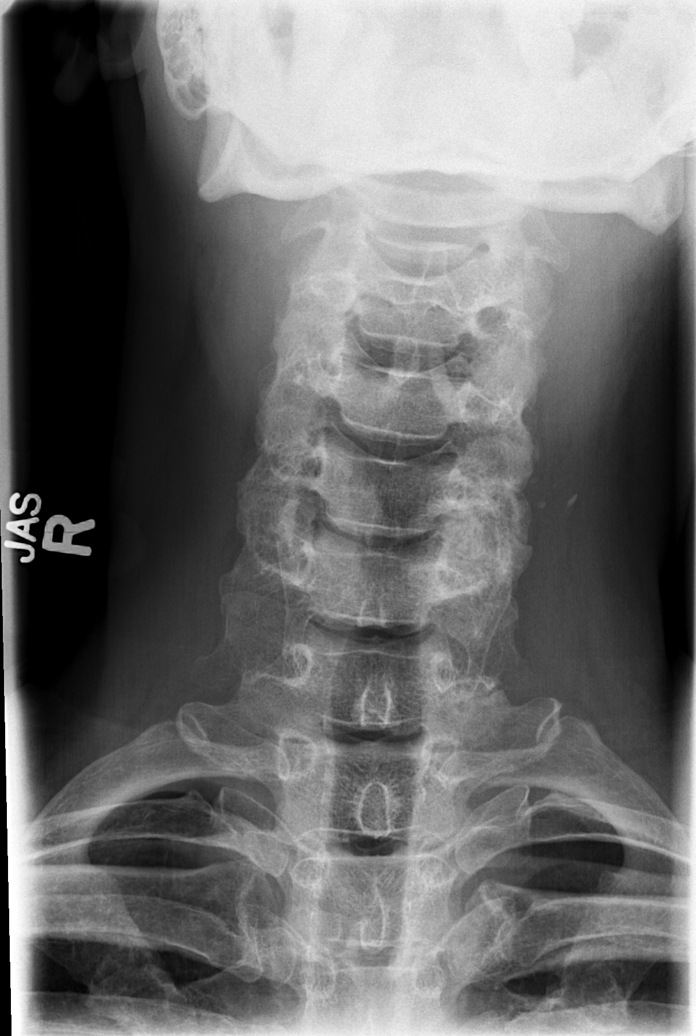

[w c-spine odontoid (1 of 2)]
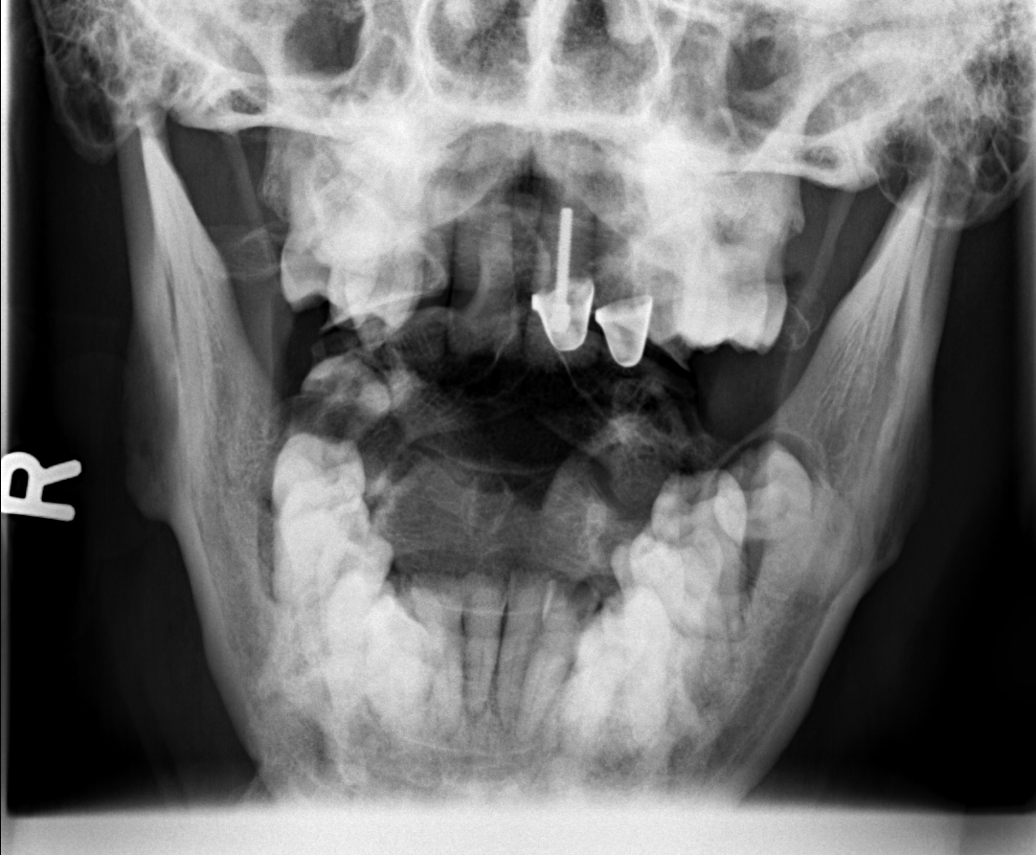

[w c-spine odontoid (2 of 2)]
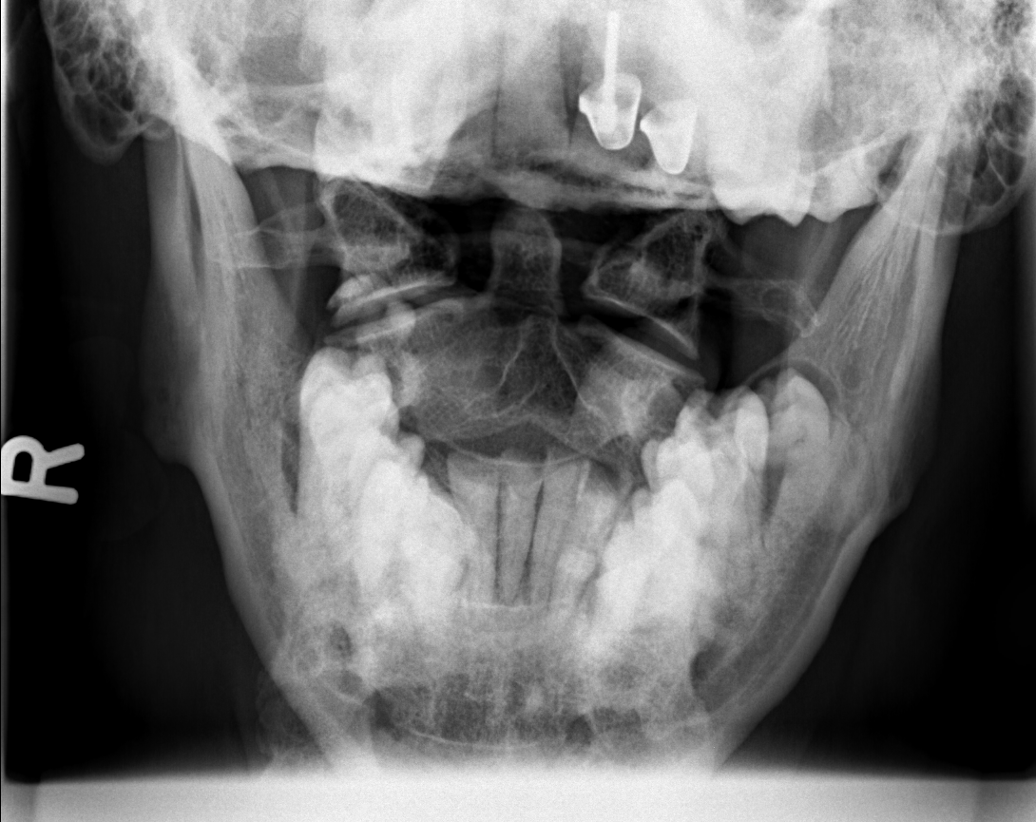

[6 of 6 positions shown; findings below may reference images not displayed]

FINDINGS: Alignment anatomic. Disc space narrowing C7-T1. Slight neural
foraminal narrowing at C5-6 on the left and C7-T1 on the right.
Unremarkable facets. Negative odontoid. Lung apices clear. Early
carotid calcification on the left.
IMPRESSION: Mild spondylosis as described.

## 2015-06-07 MED FILL — GABAPENTIN 300 MG CAPSULE: 300 | 30 days supply | Qty: 180 | Fill #4

## 2015-06-28 MED FILL — TRUE METRIX TEST STRIP: 25 days supply | Qty: 100 | Fill #8

## 2015-07-26 MED FILL — GABAPENTIN 300 MG CAPSULE: 300 | 30 days supply | Qty: 180 | Fill #5

## 2015-08-02 ENCOUNTER — Telehealth: Payer: Self-pay | Admitting: Internal Medicine

## 2015-08-02 MED FILL — TRUE METRIX TEST STRIP: 25 days supply | Qty: 100 | Fill #9

## 2015-08-02 MED FILL — $novoLOG 100 UNITS/ML VIAL: 100 | 22 days supply | Qty: 10 | Fill #4

## 2015-08-30 MED FILL — TRUE METRIX TEST STRIP: 25 days supply | Qty: 100 | Fill #10

## 2015-09-23 ENCOUNTER — Other Ambulatory Visit: Payer: Self-pay | Admitting: Internal Medicine

## 2015-09-23 MED FILL — GABAPENTIN 300 MG CAPSULE: 300 | 30 days supply | Qty: 180 | Fill #0

## 2015-09-23 MED FILL — $LANTUS 100 UNITS/ML VIAL: 100 | 26 days supply | Qty: 20 | Fill #3

## 2015-09-23 MED FILL — $novoLOG 100 UNITS/ML VIAL: 100 | 22 days supply | Qty: 10 | Fill #5

## 2015-10-01 MED FILL — TRUE METRIX TEST STRIP: 25 days supply | Qty: 100 | Fill #11

## 2015-11-01 ENCOUNTER — Other Ambulatory Visit: Payer: Self-pay | Admitting: Internal Medicine

## 2015-11-01 MED FILL — $novoLOG 100 UNITS/ML VIAL: 100 | 25 days supply | Qty: 10 | Fill #2

## 2015-11-01 MED FILL — $LANTUS 100 UNITS/ML VIAL: 100 | 13 days supply | Qty: 20 | Fill #0

## 2015-11-06 MED FILL — TRUE METRIX TEST STRIP: 25 days supply | Qty: 100 | Fill #12

## 2015-11-26 ENCOUNTER — Encounter: Payer: Self-pay | Admitting: Family Medicine

## 2015-11-26 ENCOUNTER — Ambulatory Visit: Payer: Self-pay | Attending: Family Medicine | Admitting: Family Medicine

## 2015-11-26 VITALS — BP 123/85 | HR 121 | Temp 98.1°F | Resp 20 | Ht 74.0 in | Wt 166.0 lb

## 2015-11-26 DIAGNOSIS — R000 Tachycardia, unspecified: Secondary | ICD-10-CM | POA: Insufficient documentation

## 2015-11-26 DIAGNOSIS — I10 Essential (primary) hypertension: Secondary | ICD-10-CM | POA: Insufficient documentation

## 2015-11-26 DIAGNOSIS — E1165 Type 2 diabetes mellitus with hyperglycemia: Secondary | ICD-10-CM | POA: Insufficient documentation

## 2015-11-26 DIAGNOSIS — E1149 Type 2 diabetes mellitus with other diabetic neurological complication: Secondary | ICD-10-CM

## 2015-11-26 DIAGNOSIS — H6122 Impacted cerumen, left ear: Secondary | ICD-10-CM | POA: Insufficient documentation

## 2015-11-26 DIAGNOSIS — M545 Low back pain: Secondary | ICD-10-CM | POA: Insufficient documentation

## 2015-11-26 DIAGNOSIS — M5136 Other intervertebral disc degeneration, lumbar region: Secondary | ICD-10-CM

## 2015-11-26 DIAGNOSIS — IMO0001 Reserved for inherently not codable concepts without codable children: Secondary | ICD-10-CM

## 2015-11-26 DIAGNOSIS — Z794 Long term (current) use of insulin: Secondary | ICD-10-CM | POA: Insufficient documentation

## 2015-11-26 DIAGNOSIS — Z7982 Long term (current) use of aspirin: Secondary | ICD-10-CM | POA: Insufficient documentation

## 2015-11-26 DIAGNOSIS — Z79899 Other long term (current) drug therapy: Secondary | ICD-10-CM | POA: Insufficient documentation

## 2015-11-26 LAB — GLUCOSE, POCT (MANUAL RESULT ENTRY): POC GLUCOSE: 194 mg/dL — AB (ref 70–99)

## 2015-11-26 LAB — POCT GLYCOSYLATED HEMOGLOBIN (HGB A1C): HEMOGLOBIN A1C: 10.9

## 2015-11-26 MED ORDER — CYCLOBENZAPRINE HCL 10 MG PO TABS: 10.0000 mg | ORAL_TABLET | Freq: Three times a day (TID) | ORAL | Status: DC | PRN

## 2015-11-26 MED ORDER — GLUCOSE BLOOD VI STRP: ORAL_STRIP | Status: DC

## 2015-11-26 MED ORDER — TRUEPLUS LANCETS 26G MISC: 1.0000 [drp] | Freq: Three times a day (TID) | Status: DC

## 2015-11-26 MED ORDER — INSULIN ASPART 100 UNIT/ML ~~LOC~~ SOLN: 0.0000 [IU] | Freq: Three times a day (TID) | SUBCUTANEOUS | Status: DC

## 2015-11-26 MED ORDER — TRAMADOL HCL 50 MG PO TABS: 50.0000 mg | ORAL_TABLET | Freq: Three times a day (TID) | ORAL | Status: DC | PRN

## 2015-11-26 MED ORDER — INSULIN GLARGINE 100 UNIT/ML ~~LOC~~ SOLN: 40.0000 [IU] | Freq: Two times a day (BID) | SUBCUTANEOUS | Status: DC

## 2015-11-26 MED ORDER — GABAPENTIN 300 MG PO CAPS: 600.0000 mg | ORAL_CAPSULE | Freq: Three times a day (TID) | ORAL | Status: DC

## 2015-11-26 MED FILL — traMADol HCL 50 MG TABS: 50 | 20 days supply | Qty: 60 | Fill #0

## 2015-11-26 MED FILL — TRUEplus LANCETS 28G MISC: 30 days supply | Qty: 100 | Fill #0

## 2015-11-26 MED FILL — $LANTUS 100 UNITS/ML VIAL: 100 | 20 days supply | Qty: 20 | Fill #0

## 2015-11-26 MED FILL — GABAPENTIN 300 MG CAPSULE: 300 | 30 days supply | Qty: 180 | Fill #0

## 2015-11-26 MED FILL — CYCLOBENZAPRINE 10 MG TAB: 10 | 30 days supply | Qty: 90 | Fill #0

## 2015-11-26 MED FILL — TRUE METRIX TEST STRIP: 30 days supply | Qty: 100 | Fill #0

## 2015-11-26 NOTE — Progress Notes (Signed)
Subjective:  Patient ID: Cody Campbell, male    DOB: 12-25-1965  Age: 50 y.o. MRN: [id number]  CC: Diabetes   HPI Cody Campbell. 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  His blood sugars fluctuate as he sometimes has fasting sugars in the 200s due to late night snacking. He had his eye exam late last year. Denies hypoglycemic episodes.  He complains of itching of the left ear which is intermittent and sometimes alternates with pain but denies sinus tenderness, sinus pressure, fever or hearing loss.  He takes tramadol and Flexeril intermittently for pain in his neck and lower back; x-rays from 05/2013 revealed spondylosis of the C-spine and severe degenerative disc disease of L5-S1. He denies numbness in his legs, loss of sphincteric function, falls.   Past Medical History  Diagnosis Date  . Elevated LFTs 05/21/2011  . Diabetes mellitus without complication ([place])   . Hypertension   . Hyperlipidemia     History reviewed. No pertinent past surgical history.  Allergies  Allergen Reactions  . Metformin And Related Diarrhea      Outpatient Prescriptions Prior to Visit  Medication Sig Dispense Refill  . aspirin 81 MG tablet Take 1 tablet (81 mg total) by mouth daily. 30 tablet   . Blood Glucose Monitoring Suppl (TRUE METRIX METER) W/DEVICE KIT Check Blood Sugar 4 times daily before meals and at bedtime 1 kit 0  . Insulin Syringe-Needle U-100 (B-D INS SYR ULTRAFINE 1CC/31G) 31G X 5/16" 1 ML MISC Use for administration of insulin. 100 each 12  . cyclobenzaprine (FLEXERIL) 10 MG tablet Take 1 tablet (10 mg total) by mouth 3 (three) times daily as needed for  muscle spasms. 90 tablet 5  . gabapentin (NEURONTIN) 300 MG capsule Take 2 capsules (600 mg total) by mouth 3 (three) times daily. Needs office visit for refills 180 capsule 0  . glucose blood (TRUE METRIX BLOOD GLUCOSE TEST) test strip Use as instructed 100 each 12  . insulin aspart (NOVOLOG) 100 UNIT/ML injection Inject 20 Units into the skin 2 (two) times daily before a meal. 3 vial 3  . insulin glargine (LANTUS) 100 UNIT/ML injection Inject 0.35 mLs (35 Units total) into the skin 2 (two) times daily. MUST HAVE OFFICE VISIT FOR REFILLS 40 mL 0  . TRUEPLUS LANCETS 26G MISC 1 drop by Does not apply route 4 (four) times daily -  before meals and at bedtime. 100 each 11  . traMADol (ULTRAM) 50 MG tablet Take 1 tablet (50 mg total) by mouth every 8 (eight) hours as needed. (Patient not taking: Reported on 12/13/2014) 60 tablet 1   No facility-administered medications prior to visit.    ROS Review of Systems  Constitutional: Negative for activity change and appetite change.  HENT: Negative for sinus pressure and sore throat.   Eyes: Negative for visual disturbance.  Respiratory: Negative for cough, chest tightness and shortness of breath.   Cardiovascular: Negative for chest pain and leg swelling.  Gastrointestinal: Negative for abdominal pain, diarrhea, constipation and abdominal distention.  Endocrine: Negative.   Genitourinary: Negative for dysuria.  Musculoskeletal: Positive for back pain. Negative for myalgias and joint swelling.  Skin:  Negative for rash.  Allergic/Immunologic: Negative.   Neurological: Negative for weakness, light-headedness and numbness.  Psychiatric/Behavioral: Negative for suicidal ideas and dysphoric mood.    Objective:  BP 123/85 mmHg  Pulse 121  Temp(Src) 98.1 F (36.7 C) (Oral)  Resp 20  Ht '6\' 2"'$  (1.88 m)  Wt 166 lb (75.297 kg)  BMI 21.30 kg/m2  SpO2 100%  BP/Weight 11/26/2015 8/46/6599 08/04/7015  Systolic BP 793 - 903  Diastolic BP 85 - 81  Wt. (Lbs)  166 179 174.2  BMI 21.3 22.97 22.36      Physical Exam  Constitutional: He is oriented to person, place, and time. He appears well-developed and well-nourished.  HENT:  Cerumen in left year Right ear is normal  Cardiovascular: Normal rate, normal heart sounds and intact distal pulses.   No murmur heard. Pulmonary/Chest: Effort normal and breath sounds normal. He has no wheezes. He has no rales. He exhibits no tenderness.  Abdominal: Soft. Bowel sounds are normal. He exhibits no distension and no mass. There is no tenderness.  Musculoskeletal: Normal range of motion.  Neurological: He is alert and oriented to person, place, and time.  Skin: Skin is warm and dry.  Psychiatric: He has a normal mood and affect.     Lab Results  Component Value Date   HGBA1C 10.9 11/26/2015    CMP Latest Ref Rng 05/15/2013 07/22/2011 05/20/2011  Glucose 70 - 99 mg/dL 50(L) 232(H) 252(H)  BUN 6 - 23 mg/dL '12 11 13  '$ Creatinine 0.50 - 1.35 mg/dL 1.00 1.12 0.90  Sodium 135 - 145 mEq/L 142 135 137  Potassium 3.5 - 5.3 mEq/L 5.2 4.8 4.6  Chloride 96 - 112 mEq/L 104 101 100  CO2 19 - 32 mEq/L '30 19 25  '$ Calcium 8.4 - 10.5 mg/dL 10.1 9.8 9.5  Total Protein 6.0 - 8.3 g/dL 6.8 7.0 7.1  Total Bilirubin 0.3 - 1.2 mg/dL 0.3 0.7 0.6  Alkaline Phos 39 - 117 U/L 62 74 68  AST 0 - 37 U/L 15 36 101(H)  ALT 0 - 53 U/L 11 19 59(H)     Assessment & Plan:   1. Uncontrolled type 2 diabetes mellitus without complication, with long-term current use of insulin (HCC) Uncontrolled with A1c of 10.9 Increase Lantus from 35 units to 40 units twice daily Continue NovoLog sliding scale. We'll review blood sugar log at next office visit. Diabetic health care maintenance at next visit. - HgB A1c - Glucose (CBG) - insulin glargine (LANTUS) 100 UNIT/ML injection; Inject 0.4 mLs (40 Units total) into the skin 2 (two) times daily.  Dispense: 40 mL; Refill: 3 - COMPLETE METABOLIC PANEL WITH GFR; Future - Lipid panel;  Future - Microalbumin / creatinine urine ratio; Future - glucose blood (TRUE METRIX BLOOD GLUCOSE TEST) test strip; Use as instructed  Dispense: 100 each; Refill: 12 - insulin aspart (NOVOLOG) 100 UNIT/ML injection; Inject 0-12 Units into the skin 3 (three) times daily with meals. As per sliding scale  Dispense: 3 vial; Refill: 3  2. UNSPECIFIED TACHYCARDIA Will need EKG at next visit - TSH; Future  3. Essential hypertension Controlled  4. Other diabetic neurological complication associated with type 2 diabetes mellitus (HCC) Controlled - gabapentin (NEURONTIN) 300 MG capsule; Take 2 capsules (600 mg total) by mouth 3 (three) times daily.  Dispense: 180 capsule; Refill: 3  5. Type 2 diabetes mellitus with hyperglycemia, with long-term current use of insulin (HCC) - TRUEPLUS LANCETS 26G MISC; 1 drop by Does not apply route 3 (  three) times daily before meals.  Dispense: 100 each; Refill: 3  6. Midline low back pain without sciatica Spondylosis in C-spine; severe degenerative disc disease in L5-S1 Advised of the sedating effects and he knows not to use this and drive. - cyclobenzaprine (FLEXERIL) 10 MG tablet; Take 1 tablet (10 mg total) by mouth 3 (three) times daily as needed for muscle spasms.  Dispense: 90 tablet; Refill: 2 - traMADol (ULTRAM) 50 MG tablet; Take 1 tablet (50 mg total) by mouth every 8 (eight) hours as needed.  Dispense: 60 tablet; Refill: 1  7. Cerumen impaction, left This could explain ear itching and intermittent pain. Advised to use OTC DeBrox.   Meds ordered this encounter  Medications  . gabapentin (NEURONTIN) 300 MG capsule    Sig: Take 2 capsules (600 mg total) by mouth 3 (three) times daily.    Dispense:  180 capsule    Refill:  3  . insulin glargine (LANTUS) 100 UNIT/ML injection    Sig: Inject 0.4 mLs (40 Units total) into the skin 2 (two) times daily.    Dispense:  40 mL    Refill:  3    Discontinue previous dose  . cyclobenzaprine (FLEXERIL) 10  MG tablet    Sig: Take 1 tablet (10 mg total) by mouth 3 (three) times daily as needed for muscle spasms.    Dispense:  90 tablet    Refill:  2  . glucose blood (TRUE METRIX BLOOD GLUCOSE TEST) test strip    Sig: Use as instructed    Dispense:  100 each    Refill:  12  . insulin aspart (NOVOLOG) 100 UNIT/ML injection    Sig: Inject 0-12 Units into the skin 3 (three) times daily with meals. As per sliding scale    Dispense:  3 vial    Refill:  3  . TRUEPLUS LANCETS 26G MISC    Sig: 1 drop by Does not apply route 3 (three) times daily before meals.    Dispense:  100 each    Refill:  3  . traMADol (ULTRAM) 50 MG tablet    Sig: Take 1 tablet (50 mg total) by mouth every 8 (eight) hours as needed.    Dispense:  60 tablet    Refill:  1    Follow-up: Return in about 1 month (around 12/26/2015) for follow up of Diabetes Mellitus and review of blood sugar log.Arnoldo Morale MD

## 2015-11-26 NOTE — Progress Notes (Signed)
Medication refills- gabapentin, flexeril, tramadol Left earache tachycardia

## 2015-11-29 ENCOUNTER — Ambulatory Visit: Payer: Self-pay | Attending: Internal Medicine

## 2015-12-20 MED FILL — $novoLOG 100 UNITS/ML VIAL: 100 | 27 days supply | Qty: 10 | Fill #0

## 2016-01-01 ENCOUNTER — Telehealth: Payer: Self-pay | Admitting: Internal Medicine

## 2016-01-01 MED FILL — $LANTUS 100 UNITS/ML VIAL: 100 | 20 days supply | Qty: 20 | Fill #1

## 2016-01-01 MED FILL — TRUE METRIX TEST STRIP: 30 days supply | Qty: 100 | Fill #1

## 2016-01-01 MED FILL — traMADol HCL 50 MG TABS: 50 | 20 days supply | Qty: 60 | Fill #1

## 2016-01-01 MED FILL — CYCLOBENZAPRINE 10 MG TAB: 10 | 30 days supply | Qty: 90 | Fill #1

## 2016-01-01 MED FILL — GABAPENTIN 300 MG CAPSULE: 300 | 30 days supply | Qty: 180 | Fill #1

## 2016-01-01 NOTE — Telephone Encounter (Signed)
Pt. Called requesting to speak with his nurse b/c he has been dizzy. Pt. States when he is sitting down and stands up he gets dizzy and he also stated that when yesterday he fell down b/c he got really dizzy. Pt. Takes gabapentin, Tramadol, and Flexeril. Please f/u

## 2016-02-12 MED FILL — $LANTUS 100 UNITS/ML VIAL: 100 | 20 days supply | Qty: 20 | Fill #2

## 2016-02-12 MED FILL — $novoLOG 100 UNITS/ML VIAL: 100 | 27 days supply | Qty: 10 | Fill #1

## 2016-02-12 MED FILL — TRUE METRIX TEST STRIP: 30 days supply | Qty: 100 | Fill #2

## 2016-10-15 ENCOUNTER — Ambulatory Visit: Payer: Self-pay | Attending: Internal Medicine | Admitting: Internal Medicine

## 2016-10-15 ENCOUNTER — Encounter: Payer: Self-pay | Admitting: Internal Medicine

## 2016-10-15 VITALS — BP 80/60 | HR 92 | Temp 98.7°F | Resp 18 | Ht 74.0 in | Wt 171.6 lb

## 2016-10-15 DIAGNOSIS — I10 Essential (primary) hypertension: Secondary | ICD-10-CM

## 2016-10-15 DIAGNOSIS — Z888 Allergy status to other drugs, medicaments and biological substances status: Secondary | ICD-10-CM | POA: Insufficient documentation

## 2016-10-15 DIAGNOSIS — E785 Hyperlipidemia, unspecified: Secondary | ICD-10-CM | POA: Insufficient documentation

## 2016-10-15 DIAGNOSIS — Z79899 Other long term (current) drug therapy: Secondary | ICD-10-CM | POA: Insufficient documentation

## 2016-10-15 DIAGNOSIS — H9313 Tinnitus, bilateral: Secondary | ICD-10-CM

## 2016-10-15 DIAGNOSIS — G4700 Insomnia, unspecified: Secondary | ICD-10-CM | POA: Insufficient documentation

## 2016-10-15 DIAGNOSIS — K5900 Constipation, unspecified: Secondary | ICD-10-CM

## 2016-10-15 DIAGNOSIS — N529 Male erectile dysfunction, unspecified: Secondary | ICD-10-CM | POA: Insufficient documentation

## 2016-10-15 DIAGNOSIS — E1165 Type 2 diabetes mellitus with hyperglycemia: Secondary | ICD-10-CM | POA: Insufficient documentation

## 2016-10-15 DIAGNOSIS — M5136 Other intervertebral disc degeneration, lumbar region: Secondary | ICD-10-CM | POA: Insufficient documentation

## 2016-10-15 DIAGNOSIS — E118 Type 2 diabetes mellitus with unspecified complications: Secondary | ICD-10-CM

## 2016-10-15 DIAGNOSIS — Z7982 Long term (current) use of aspirin: Secondary | ICD-10-CM | POA: Insufficient documentation

## 2016-10-15 DIAGNOSIS — F329 Major depressive disorder, single episode, unspecified: Secondary | ICD-10-CM | POA: Insufficient documentation

## 2016-10-15 DIAGNOSIS — E1149 Type 2 diabetes mellitus with other diabetic neurological complication: Secondary | ICD-10-CM

## 2016-10-15 DIAGNOSIS — Z23 Encounter for immunization: Secondary | ICD-10-CM

## 2016-10-15 DIAGNOSIS — K069 Disorder of gingiva and edentulous alveolar ridge, unspecified: Secondary | ICD-10-CM

## 2016-10-15 DIAGNOSIS — E1140 Type 2 diabetes mellitus with diabetic neuropathy, unspecified: Secondary | ICD-10-CM | POA: Insufficient documentation

## 2016-10-15 DIAGNOSIS — R000 Tachycardia, unspecified: Secondary | ICD-10-CM | POA: Insufficient documentation

## 2016-10-15 DIAGNOSIS — R42 Dizziness and giddiness: Secondary | ICD-10-CM | POA: Insufficient documentation

## 2016-10-15 DIAGNOSIS — Z794 Long term (current) use of insulin: Secondary | ICD-10-CM | POA: Insufficient documentation

## 2016-10-15 DIAGNOSIS — M722 Plantar fascial fibromatosis: Secondary | ICD-10-CM

## 2016-10-15 DIAGNOSIS — IMO0002 Reserved for concepts with insufficient information to code with codable children: Secondary | ICD-10-CM

## 2016-10-15 DIAGNOSIS — F32A Depression, unspecified: Secondary | ICD-10-CM

## 2016-10-15 LAB — POCT GLYCOSYLATED HEMOGLOBIN (HGB A1C): Hemoglobin A1C: 10.7

## 2016-10-15 LAB — GLUCOSE, POCT (MANUAL RESULT ENTRY): POC GLUCOSE: 159 mg/dL — AB (ref 70–99)

## 2016-10-15 MED ORDER — TRUEPLUS LANCETS 26G MISC: 1.0000 [drp] | Freq: Three times a day (TID) | 3 refills | Status: DC

## 2016-10-15 MED ORDER — GABAPENTIN 300 MG PO CAPS: 600.0000 mg | ORAL_CAPSULE | Freq: Three times a day (TID) | ORAL | 3 refills | Status: DC

## 2016-10-15 MED ORDER — INSULIN ASPART 100 UNIT/ML ~~LOC~~ SOLN: 0.0000 [IU] | Freq: Three times a day (TID) | SUBCUTANEOUS | 3 refills | Status: DC

## 2016-10-15 MED ORDER — ESCITALOPRAM OXALATE 10 MG PO TABS: 10.0000 mg | ORAL_TABLET | Freq: Every day | ORAL | 3 refills | Status: DC

## 2016-10-15 MED ORDER — LISINOPRIL 5 MG PO TABS: 5.0000 mg | ORAL_TABLET | Freq: Every day | ORAL | 3 refills | Status: DC

## 2016-10-15 MED ORDER — GLUCOSE BLOOD VI STRP: ORAL_STRIP | 12 refills | Status: DC

## 2016-10-15 MED ORDER — ASPIRIN 81 MG PO CHEW: 81.0000 mg | CHEWABLE_TABLET | Freq: Once | ORAL | Status: DC

## 2016-10-15 MED ORDER — "INSULIN SYRINGE-NEEDLE U-100 31G X 5/16"" 1 ML MISC": 12 refills | Status: DC

## 2016-10-15 MED ORDER — INSULIN GLARGINE 100 UNIT/ML ~~LOC~~ SOLN: 40.0000 [IU] | Freq: Two times a day (BID) | SUBCUTANEOUS | 3 refills | Status: DC

## 2016-10-15 MED FILL — TRUEPLUS SYR 0.5ML 30GX5/16: 30G X 5/16" | 100 days supply | Qty: 100 | Fill #0

## 2016-10-15 MED FILL — !NOVOLOG 100UNITS/ML VIAL: 100/ML | 27 days supply | Qty: 10 | Fill #0

## 2016-10-15 MED FILL — LISINOPRIL 5 MG TAB: 5 | 30 days supply | Qty: 30 | Fill #0

## 2016-10-15 MED FILL — ?ESCITALOPRAM 10 MG TABLET: 10 | 30 days supply | Qty: 30 | Fill #0

## 2016-10-15 MED FILL — GABAPENTIN 300 MG CAPSULE: 300 | 30 days supply | Qty: 180 | Fill #0

## 2016-10-15 MED FILL — !LANTUS 100 UNITS/ML VIAL: 100 | 12 days supply | Qty: 10 | Fill #0

## 2016-10-15 MED FILL — TRUEplus LANCETS 28G MISC: 33 days supply | Qty: 100 | Fill #0

## 2016-10-15 MED FILL — TRUE METRIX TEST STRIP: 25 days supply | Qty: 100 | Fill #0

## 2016-10-15 NOTE — Progress Notes (Signed)
Patient ID: Cody Campbell. Shadd, male    DOB: Apr 30, 1966  MRN: [id number]  CC:  f/u  Subjective:  Cody Campbell is a 51 y.o. male who presents for routine f/u visit.Marland Kitchen His concerns today include:  1.  HTN: checks 3-4 x a day.  Range 170-180/90-101.  -was taking Lisinopril 5 mg 3-4 x a day x 1 wk which he got from a friend -endorses dizziness  -limits salt  2.  DM: Meds: out of Novolog x 8 mths.  Taking Lantus 40 units Eating habits: decrease appetite which he attributes to depression.  Exercise: fairly active due to type of work he did Fee/legt: "feels like thick wool socks on." Checks feet regularly -out of Neurontin -had eye exam 3 mths ago in [place]. New glasses  3.  Constipation about once a wk  4.  Feeling of pressure in ears x several months.  "Hear blood flowing with my heart beat. Like wind blowing." Becomes unblocked if he holds his nose and blow hard.  Some decrease hearing.  Never worked in loud environments  Loss job recently; did Water engineer. Lacks energy. Thinks health issues contributed to job loss. "I know I'm depress because everything just came down on me at one time." -endorses poor appetite, low energy, poor sleep and other times can sleep the entire day -denies SI/HI  Patient Active Problem List   Diagnosis Date Noted  . Degenerative disc disease, lumbar 05/15/2013  . Diabetic neuropathy ([place]) 03/16/2013  . Fibrous nodule 11/14/2012  . Body aches 10/13/2012  . Hypertension 05/20/2011  . UNSPECIFIED TACHYCARDIA 09/19/2008  . DM (diabetes mellitus), type 2, uncontrolled ([place]) 07/29/2006  . HYPERLIPIDEMIA 07/29/2006  . 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 TOBACCO DEPENDENCE 07/29/2006  . INSOMNIA NOS 07/29/2006     Current Outpatient Prescriptions on File Prior to Visit  Medication Sig Dispense Refill  . Blood Glucose Monitoring Suppl (TRUE METRIX METER) W/DEVICE KIT Check Blood Sugar 4 times daily before meals and at bedtime 1 kit 0  . glucose  blood (TRUE METRIX BLOOD GLUCOSE TEST) test strip Use as instructed 100 each 12  . Insulin Syringe-Needle U-100 (B-D INS SYR ULTRAFINE 1CC/31G) 31G X 5/16" 1 ML MISC Use for administration of insulin. 100 each 12  . TRUEPLUS LANCETS 26G MISC 1 drop by Does not apply route 3 (three) times daily before meals. 100 each 3  . aspirin 81 MG tablet Take 1 tablet (81 mg total) by mouth daily. (Patient not taking: Reported on 10/15/2016) 30 tablet   . cyclobenzaprine (FLEXERIL) 10 MG tablet Take 1 tablet (10 mg total) by mouth 3 (three) times daily as needed for muscle spasms. (Patient not taking: Reported on 10/15/2016) 90 tablet 2  . gabapentin (NEURONTIN) 300 MG capsule Take 2 capsules (600 mg total) by mouth 3 (three) times daily. (Patient not taking: Reported on 10/15/2016) 180 capsule 3  . insulin aspart (NOVOLOG) 100 UNIT/ML injection Inject 0-12 Units into the skin 3 (three) times daily with meals. As per sliding scale (Patient not taking: Reported on 10/15/2016) 3 vial 3  . insulin glargine (LANTUS) 100 UNIT/ML injection Inject 0.4 mLs (40 Units total) into the skin 2 (two) times daily. (Patient not taking: Reported on 10/15/2016) 40 mL 3  . traMADol (ULTRAM) 50 MG tablet Take 1 tablet (50 mg total) by mouth every 8 (eight) hours as needed. (Patient not taking: Reported on 10/15/2016) 60 tablet 1   No current facility-administered medications on file prior to visit.  Allergies  Allergen Reactions  . Metformin And Related Diarrhea     ROS: Review of Systems  Constitutional: Positive for activity change, appetite change and fatigue.  Respiratory: Negative for shortness of breath.   Cardiovascular: Negative for chest pain.  Neurological: Positive for dizziness.     PHYSICAL EXAM: BP 133/90 (BP Location: Right Arm, Patient Position: Sitting, Cuff Size: Large)   Pulse 92   Temp 98.7 F (37.1 C) (Oral)   Resp 18   Ht 6\' 2"  (1.88 m)   Wt 171 lb 9.6 oz (77.8 kg)   SpO2 100%   BMI 22.03  kg/m   Wt Readings from Last 3 Encounters:  10/15/16 171 lb 9.6 oz (77.8 kg)  11/26/15 166 lb (75.3 kg)  01/23/15 179 lb (81.2 kg)     Physical Exam  Constitutional: He is oriented to person, place, and time.  Well-appearing middle-aged African-American male in no acute distress  HENT:  Right Ear: External ear normal.  Left Ear: External ear normal.  No carotid bruits. Gums:  Receding gum lines with moderate plaque build up  Neck: No thyromegaly present.  Cardiovascular: Normal rate, regular rhythm and normal heart sounds.   No murmur heard. Pulmonary/Chest: Effort normal and breath sounds normal.  Lymphadenopathy:    He has no cervical adenopathy.  Neurological: He is oriented to person, place, and time.  Skin:  Toenails are overgrown and in need of clipping. Foot exam: He has a 3 cm raised, nontender, movable mass on the inner arch on the plantar surface of both feet.   Diabetic Foot Exam - Simple   Simple Foot Form Diabetic Foot exam was performed with the following findings:  Yes 10/15/2016  2:34 PM  Visual Inspection No deformities, no ulcerations, no other skin breakdown bilaterally:  Yes See comments:  Yes Sensation Testing Intact to touch and monofilament testing bilaterally:  Yes Pulse Check Posterior Tibialis and Dorsalis pulse intact bilaterally:  Yes Comments Toe nails over grown.  No ulcers.       Results for orders placed or performed in visit on 10/15/16  POCT glycosylated hemoglobin (Hb A1C)  Result Value Ref Range   Hemoglobin A1C 10.7   POCT glucose (manual entry)  Result Value Ref Range   POC Glucose 159 (A) 70 - 99 mg/dl   Depression screen PHQ 2/9 10/15/2016  Decreased Interest 2  Down, Depressed, Hopeless 2  PHQ - 2 Score 4  Altered sleeping 3  Tired, decreased energy 3  Change in appetite 2  Feeling bad or failure about yourself  1  Trouble concentrating 2  Moving slowly or fidgety/restless 3  Suicidal thoughts 0  PHQ-9 Score 18    Difficult doing work/chores Somewhat difficult     ASSESSMENT AND PLAN: 1. Uncontrolled type 2 diabetes mellitus with complication, with long-term current use of insulin (HCC) -Refill NovoLog and Lantus. Patient to check blood sugars and bring in readings. Appt with  pharmacist clinician in a few weeks. Discussed the importance of healthy eating habits, regular aerobic exercise (at least 150 minutes a week as tolerated) and medication compliance to achieve or maintain control of diabetes.  - POCT glycosylated hemoglobin (Hb A1C) - POCT glucose (manual entry) - Comprehensive metabolic panel - CBC - Hepatitis C Antibody - aspirin chewable tablet 81 mg; Chew 1 tablet (81 mg total) by mouth once. - glucose blood (TRUE METRIX BLOOD GLUCOSE TEST) test strip; Use as instructed  Dispense: 100 each; Refill: 12 - insulin aspart (NOVOLOG) 100  UNIT/ML injection; Inject 0-12 Units into the skin 3 (three) times daily with meals. As per sliding scale  Dispense: 3 vial; Refill: 3 - insulin glargine (LANTUS) 100 UNIT/ML injection; Inject 0.4 mLs (40 Units total) into the skin 2 (two) times daily.  Dispense: 40 mL; Refill: 3 - Insulin Syringe-Needle U-100 (B-D INS SYR ULTRAFINE 1CC/31G) 31G X 5/16" 1 ML MISC; Use for administration of insulin.  Dispense: 100 each; Refill: 12 - TRUEPLUS LANCETS 26G MISC; 1 drop by Does not apply route 3 (three) times daily before meals.  Dispense: 100 each; Refill: 3 - Pneumococcal polysaccharide vaccine 23-valent greater than or equal to 2yo subcutaneous/IM  2. Essential hypertension -Repeat blood pressure today is low.  However reported ambulatory readings high. -pt given a prescription for lisinopril 5 mg and advised not to start the medication unless ambulatory blood pressures rise above 140/90.  F/U appt with pharmacist clinician next week; advised to bring BP readings -DASH diet -advised against taking other pt BP meds -- lisinopril (PRINIVIL,ZESTRIL) 5 MG tablet;  Take 1 tablet (5 mg total) by mouth daily.  Dispense: 30 tablet; Refill: 3  3. Tinnitus of both ears -Using not much can be done for tinnitus.   - Ambulatory referral to ENT though to have hearing checked  4. Plantar fascial fibromatosis of both feet - Ambulatory referral to Podiatry  5. Depression, unspecified depression type -Discussed starting an antidepressant. Patient is willing and wanting to be tried with 1. Started him on low-dose Lexapro. Went over possible side effects. Patient to call or come in if he has increased depression or suicidal ideation. I have referred him to our behavioral health specialist. - escitalopram (LEXAPRO) 10 MG tablet; Take 1 tablet (10 mg total) by mouth daily.  Dispense: 30 tablet; Refill: 3  6. Constipation, unspecified constipation type -Encourage increased fiber intake -Patient to try MiraLAX or Colace over-the-counter. -discussed colon CA screening.  Pt wants to hold off for now  7. Other diabetic neurological complication associated with type 2 diabetes mellitus (HCC) - gabapentin (NEURONTIN) 300 MG capsule; Take 2 capsules (600 mg total) by mouth 3 (three) times daily.  Dispense: 180 capsule; Refill: 3  8. Gum disease - Ambulatory referral to Dentistry  Declined HIV screening and deferred discussion for next visit on colonoscopy  Patient was given the opportunity to ask questions.  Patient verbalized understanding of the plan and was able to repeat key elements of the plan.   Orders Placed This Encounter  Procedures  . POCT glycosylated hemoglobin (Hb A1C)  . POCT glucose (manual entry)     Requested Prescriptions    No prescriptions requested or ordered in this encounter    No future appointments.  Karle Plumber, MD, FACP

## 2016-10-15 NOTE — Patient Instructions (Addendum)
INSTRUCTIONS FOR FRONT DESK:  Please scheduled appointment with Miss Leavy CellaBoyd for financial counseling.  Please schedule appointment with Jenel LucksJasmine Lewis for depression.  Please schedule appointment with Candace GallusMiss Hummer for follow-up on blood pressure and diabetes. Please try to get him in on Monday of next week to see her.    Instructions to patient: Hold off on taking lisinopril unless blood pressure climbs above 140/90.  Restart NovoLog and Lantus.  For constipation try to increase fiber in the diet as discussed. You can also purchase Colace or MiraLAX over-the-counter and use as needed. We will discuss need for colonoscopy on your next visit.  You have been referred to dentist, podiatry and ENT.  Pneumococcal Polysaccharide Vaccine: What You Need to Know   1. Why get vaccinated? Vaccination can protect older adults (and some children and younger adults) from pneumococcal disease. Pneumococcal disease is caused by bacteria that can spread from person to person through close contact. It can cause ear infections, and it can also lead to more serious infections of the:  Lungs (pneumonia),  Blood (bacteremia), and  Covering of the brain and spinal cord (meningitis). Meningitis can cause deafness and brain damage, and it can be fatal. Anyone can get pneumococcal disease, but children under 722 years of age, people with certain medical conditions, adults over 53 years of age, and cigarette smokers are at the highest risk. About 18,000 older adults die each year from pneumococcal disease in the [place]. Treatment of pneumococcal infections with penicillin and other drugs used to be more effective. But some strains of the disease have become resistant to these drugs. This makes prevention of the disease, through vaccination, even more important. 2. Pneumococcal polysaccharide vaccine (PPSV23) Pneumococcal polysaccharide vaccine (PPSV23) protects against 23 types of pneumococcal bacteria. It  will not prevent all pneumococcal disease. PPSV23 is recommended for:  All adults 53 years of age and older,  Anyone 2 through 53 years of age with certain long-term health problems,  Anyone 2 through 53 years of age with a weakened immune system,  Adults 10419 through 53 years of age who smoke cigarettes or have asthma. Most people need only one dose of PPSV. A second dose is recommended for certain high-risk groups. People 4765 and older should get a dose even if they have gotten one or more doses of the vaccine before they turned 65. Your healthcare provider can give you more information about these recommendations. Most healthy adults develop protection within 2 to 3 weeks of getting the shot. 3. Some people should not get this vaccine  Anyone who has had a life-threatening allergic reaction to PPSV should not get another dose.  Anyone who has a severe allergy to any component of PPSV should not receive it. Tell your provider if you have any severe allergies.  Anyone who is moderately or severely ill when the shot is scheduled may be asked to wait until they recover before getting the vaccine. Someone with a mild illness can usually be vaccinated.  Children less than 142 years of age should not receive this vaccine.  There is no evidence that PPSV is harmful to either a pregnant woman or to her fetus. However, as a precaution, women who need the vaccine should be vaccinated before becoming pregnant, if possible. 4. Risks of a vaccine reaction With any medicine, including vaccines, there is a chance of side effects. These are usually mild and go away on their own, but serious reactions are also possible. About half of people  who get PPSV have mild side effects, such as redness or pain where the shot is given, which go away within about two days. Less than 1 out of 100 people develop a fever, muscle aches, or more severe local reactions. Problems that could happen after any vaccine:   People  sometimes faint after a medical procedure, including vaccination. Sitting or lying down for about 15 minutes can help prevent fainting, and injuries caused by a fall. Tell your doctor if you feel dizzy, or have vision changes or ringing in the ears.  Some people get severe pain in the shoulder and have difficulty moving the arm where a shot was given. This happens very rarely.  Any medication can cause a severe allergic reaction. Such reactions from a vaccine are very rare, estimated at about 1 in a million doses, and would happen within a few minutes to a few hours after the vaccination. As with any medicine, there is a very remote chance of a vaccine causing a serious injury or death. The safety of vaccines is always being monitored. For more information, visit: http://floyd.org/ 5. What if there is a serious reaction? What should I look for?  Look for anything that concerns you, such as signs of a severe allergic reaction, very high fever, or unusual behavior. Signs of a severe allergic reaction can include hives, swelling of the face and throat, difficulty breathing, a fast heartbeat, dizziness, and weakness. These would usually start a few minutes to a few hours after the vaccination. What should I do?  If you think it is a severe allergic reaction or other emergency that can't wait, call 9-1-1 or get to the nearest hospital. Otherwise, call your doctor. Afterward, the reaction should be reported to the Vaccine Adverse Event Reporting System (VAERS). Your doctor might file this report, or you can do it yourself through the VAERS web site at www.vaers.LAgents.no, or by calling 1-587-185-4986. VAERS does not give medical advice.  6. How can I learn more?  Ask your doctor. He or she can give you the vaccine package insert or suggest other sources of information.  Call your local or state health department.  Contact the Centers for Disease Control and Prevention (CDC):  Call  985-451-7144 (1-800-CDC-INFO) or  Visit CDC's website at PicCapture.uy CDC Pneumococcal Polysaccharide Vaccine VIS (09/22/13) This information is not intended to replace advice given to you by your health care provider. Make sure you discuss any questions you have with your health care provider. Document Released: 03/15/2006 Document Revised: 02/06/2016 Document Reviewed: 02/06/2016 Elsevier Interactive Patient Education  2017 [employer].

## 2016-10-20 ENCOUNTER — Ambulatory Visit: Payer: Self-pay | Admitting: Pharmacist

## 2016-10-20 ENCOUNTER — Ambulatory Visit: Payer: Self-pay

## 2016-10-20 ENCOUNTER — Telehealth: Payer: Self-pay | Admitting: Licensed Clinical Social Worker

## 2016-10-20 LAB — COMPREHENSIVE METABOLIC PANEL
ALBUMIN: 4.4 g/dL (ref 3.5–5.5)
ALT: 12 IU/L (ref 0–44)
AST: 20 IU/L (ref 0–40)
Albumin/Globulin Ratio: 1.6 (ref 1.2–2.2)
Alkaline Phosphatase: 69 IU/L (ref 39–117)
BUN / CREAT RATIO: 14 (ref 9–20)
BUN: 16 mg/dL (ref 6–24)
Bilirubin Total: <0.2 mg/dL (ref 0.0–1.2)
CALCIUM: 9.7 mg/dL (ref 8.7–10.2)
CO2: 26 mmol/L (ref 18–29)
Chloride: 95 mmol/L — ABNORMAL LOW (ref 96–106)
Creatinine, Ser: 1.14 mg/dL (ref 0.76–1.27)
GFR, EST AFRICAN AMERICAN: 85 mL/min/{1.73_m2} (ref >59)
GFR, EST NON AFRICAN AMERICAN: 74 mL/min/{1.73_m2} (ref >59)
GLOBULIN, TOTAL: 2.8 g/dL (ref 1.5–4.5)
Glucose: 127 mg/dL — ABNORMAL HIGH (ref 65–99)
Potassium: 4.6 mmol/L (ref 3.5–5.2)
SODIUM: 136 mmol/L (ref 134–144)
TOTAL PROTEIN: 7.2 g/dL (ref 6.0–8.5)

## 2016-10-20 LAB — CBC

## 2016-10-20 LAB — HEPATITIS C ANTIBODY: Hep C Virus Ab: <0.1 s/co ratio (ref 0.0–0.9)

## 2016-10-20 NOTE — Telephone Encounter (Signed)
LCSWA attempted to contact pt via telephone regarding behavioral health appointment scheduled for 10/21/16. Message states phone number is no longer in service.

## 2016-10-21 ENCOUNTER — Ambulatory Visit: Payer: Self-pay | Admitting: Licensed Clinical Social Worker

## 2016-11-02 ENCOUNTER — Emergency Department (HOSPITAL_COMMUNITY)
Admission: EM | Admit: 2016-11-02 | Discharge: 2016-11-02 | Disposition: A | Discharge status: Home / Self Care | Payer: Self-pay | Attending: Emergency Medicine | Admitting: Emergency Medicine

## 2016-11-02 ENCOUNTER — Encounter (HOSPITAL_COMMUNITY): Payer: Self-pay | Admitting: *Deleted

## 2016-11-02 DIAGNOSIS — E1140 Type 2 diabetes mellitus with diabetic neuropathy, unspecified: Secondary | ICD-10-CM | POA: Insufficient documentation

## 2016-11-02 DIAGNOSIS — F17290 Nicotine dependence, other tobacco product, uncomplicated: Secondary | ICD-10-CM | POA: Insufficient documentation

## 2016-11-02 DIAGNOSIS — Z794 Long term (current) use of insulin: Secondary | ICD-10-CM | POA: Insufficient documentation

## 2016-11-02 DIAGNOSIS — R55 Syncope and collapse: Secondary | ICD-10-CM

## 2016-11-02 DIAGNOSIS — I10 Essential (primary) hypertension: Secondary | ICD-10-CM | POA: Insufficient documentation

## 2016-11-02 DIAGNOSIS — I951 Orthostatic hypotension: Secondary | ICD-10-CM | POA: Insufficient documentation

## 2016-11-02 DIAGNOSIS — Z7982 Long term (current) use of aspirin: Secondary | ICD-10-CM | POA: Insufficient documentation

## 2016-11-02 DIAGNOSIS — Z79899 Other long term (current) drug therapy: Secondary | ICD-10-CM | POA: Insufficient documentation

## 2016-11-02 LAB — URINALYSIS, ROUTINE W REFLEX MICROSCOPIC
Bacteria, UA: NONE SEEN
Bilirubin Urine: NEGATIVE
Glucose, UA: 150 mg/dL — AB
Ketones, ur: NEGATIVE mg/dL
LEUKOCYTES UA: NEGATIVE
Nitrite: NEGATIVE
PH: 5 (ref 5.0–8.0)
Protein, ur: NEGATIVE mg/dL
SPECIFIC GRAVITY, URINE: 1.017 (ref 1.005–1.030)
SQUAMOUS EPITHELIAL / LPF: NONE SEEN

## 2016-11-02 LAB — DIFFERENTIAL
Basophils Absolute: 0 10*3/uL (ref 0.0–0.1)
Basophils Relative: 0 %
Eosinophils Absolute: 0.1 10*3/uL (ref 0.0–0.7)
Eosinophils Relative: 1 %
LYMPHS ABS: 1.6 10*3/uL (ref 0.7–4.0)
LYMPHS PCT: 23 %
Monocytes Absolute: 0.4 10*3/uL (ref 0.1–1.0)
Monocytes Relative: 6 %
NEUTROS PCT: 70 %
Neutro Abs: 4.9 10*3/uL (ref 1.7–7.7)

## 2016-11-02 LAB — BASIC METABOLIC PANEL
Anion gap: 10 (ref 5–15)
BUN: 27 mg/dL — ABNORMAL HIGH (ref 6–20)
CHLORIDE: 100 mmol/L — AB (ref 101–111)
CO2: 24 mmol/L (ref 22–32)
CREATININE: 1.77 mg/dL — AB (ref 0.61–1.24)
Calcium: 9.5 mg/dL (ref 8.9–10.3)
GFR calc Af Amer: 49 mL/min — ABNORMAL LOW (ref >60)
GFR calc non Af Amer: 42 mL/min — ABNORMAL LOW (ref >60)
Glucose, Bld: 193 mg/dL — ABNORMAL HIGH (ref 65–99)
Potassium: 4 mmol/L (ref 3.5–5.1)
Sodium: 134 mmol/L — ABNORMAL LOW (ref 135–145)

## 2016-11-02 LAB — CBC
HCT: 32.3 % — ABNORMAL LOW (ref 39.0–52.0)
Hemoglobin: 10.5 g/dL — ABNORMAL LOW (ref 13.0–17.0)
MCH: 25.8 pg — ABNORMAL LOW (ref 26.0–34.0)
MCHC: 32.5 g/dL (ref 30.0–36.0)
MCV: 79.4 fL (ref 78.0–100.0)
PLATELETS: 398 10*3/uL (ref 150–400)
RBC: 4.07 MIL/uL — AB (ref 4.22–5.81)
RDW: 16 % — ABNORMAL HIGH (ref 11.5–15.5)
WBC: 7.2 10*3/uL (ref 4.0–10.5)

## 2016-11-02 LAB — CBG MONITORING, ED: Glucose-Capillary: 232 mg/dL — ABNORMAL HIGH (ref 65–99)

## 2016-11-02 LAB — I-STAT TROPONIN, ED: Troponin i, poc: 0.01 ng/mL (ref 0.00–0.08)

## 2016-11-02 MED ORDER — SODIUM CHLORIDE 0.9 % IV BOLUS (SEPSIS)
1000.0000 mL | Freq: Once | INTRAVENOUS | Status: AC
  Administered 2016-11-02: 1000 mL via INTRAVENOUS

## 2016-11-02 NOTE — ED Notes (Signed)
Care handoff to Perpetue, RN 

## 2016-11-02 NOTE — Discharge Instructions (Signed)
Drinking plenty of fluids, increase salt intake, and wear compression stockings to keep your blood pressure up. Return for worsening symptoms, including confusion, recurrent passing out, chest pain, difficulty breathing, or any other symptoms concerning to you.

## 2016-11-02 NOTE — ED Provider Notes (Signed)
[facility]-EMERGENCY DEPT Provider Note   CSN: [id number] Arrival date & time: 11/02/16  1511     History   Chief Complaint Chief Complaint  Patient presents with  . Loss of Consciousness    HPI Joren Rehm. Gaskill is a 51 y.o. male.  HPI 51 year old male who Presents with syncopal episode. History of hypertension, hyperlipidemia and diabetes. States that he was walking a mile to and from his job today outside, and when he returned to work, was feeling very lightheaded and foggy. States that he sat down several times because he was feeling dizzy, and did have an episode of syncope for a few seconds while he was sitting in a chair. Denies any associating chest pain, difficulty breathing, leg swelling, tongue biting, urinary incontinence, focal numbness or weakness. No recent illnesses including vomiting, diarrhea, fevers, melena or hematochezia. No abdominal pain or back pain.  Past Medical History:  Diagnosis Date  . Diabetes mellitus without complication (HCC)   . Elevated LFTs 05/21/2011  . Hyperlipidemia   . Hypertension     Patient Active Problem List   Diagnosis Date Noted  . Tinnitus of both ears 10/15/2016  . Plantar fascial fibromatosis of both feet 10/15/2016  . Depression 10/15/2016  . Gum disease 10/15/2016  . Degenerative disc disease, lumbar 05/15/2013  . Diabetic neuropathy (HCC) 03/16/2013  . Fibrous nodule 11/14/2012  . Body aches 10/13/2012  . Hypertension 05/20/2011  . UNSPECIFIED TACHYCARDIA 09/19/2008  . DM (diabetes mellitus), type 2, uncontrolled (HCC) 07/29/2006  . HYPERLIPIDEMIA 07/29/2006  . 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 TOBACCO DEPENDENCE 07/29/2006  . INSOMNIA NOS 07/29/2006    History reviewed. No pertinent surgical history.     Home Medications    Prior to Admission medications   Medication Sig Start Date End Date Taking? Authorizing Provider  aspirin EC 81 MG tablet Take 81 mg by mouth daily.   Yes [provider]    Aspirin-Salicylamide-Caffeine (BC HEADACHE POWDER PO) Take 1 packet by mouth 2 (two) times daily as needed (pain).   Yes [provider]  gabapentin (NEURONTIN) 300 MG capsule Take 2 capsules (600 mg total) by mouth 3 (three) times daily. Patient taking differently: Take 300-600 mg by mouth See admin instructions. Take 1-2 capsules (300-600 mg) by mouth two to three times daily 10/15/16  Yes Marcine Matar, MD  insulin aspart (NOVOLOG) 100 UNIT/ML injection Inject 0-12 Units into the skin 3 (three) times daily with meals. As per sliding scale Patient taking differently: Inject 5-15 Units into the skin 3 (three) times daily before meals. Per sliding scale: CBG <200 5 units, 201-300 10 units, >300 15 units - pt also adjusts for carbs 10/15/16  Yes Marcine Matar, MD  insulin glargine (LANTUS) 100 UNIT/ML injection Inject 0.4 mLs (40 Units total) into the skin 2 (two) times daily. Patient taking differently: Inject 45 Units into the skin See admin instructions. Inject 45 units subcutaneously at bedtime, may also inject 35-45 units before lunch as needed for CBG >200 10/15/16  Yes Marcine Matar, MD  lisinopril (PRINIVIL,ZESTRIL) 5 MG tablet Take 1 tablet (5 mg total) by mouth daily. Patient taking differently: Take 5 mg by mouth daily as needed (SBP >140).  10/15/16  Yes Marcine Matar, MD  oxymetazoline (AFRIN NODRIP SEVERE CONGEST) 0.05 % nasal spray Place 1 spray into both nostrils 2 (two) times daily as needed for congestion.   Yes [provider]  Blood Glucose Monitoring Suppl (TRUE METRIX METER) W/DEVICE  KIT Check Blood Sugar 4 times daily before meals and at bedtime 11/13/14   Tresa Garter, MD  cyclobenzaprine (FLEXERIL) 10 MG tablet Take 1 tablet (10 mg total) by mouth 3 (three) times daily as needed for muscle spasms. Patient not taking: Reported on 10/15/2016 11/26/15   Arnoldo Morale, MD  escitalopram (LEXAPRO) 10 MG tablet Take 1 tablet (10 mg total) by  mouth daily. Patient not taking: Reported on 11/02/2016 10/15/16   Ladell Pier, MD  glucose blood (TRUE METRIX BLOOD GLUCOSE TEST) test strip Use as instructed 10/15/16   Ladell Pier, MD  Insulin Syringe-Needle U-100 (B-D INS SYR ULTRAFINE 1CC/31G) 31G X 5/16" 1 ML MISC Use for administration of insulin. 10/15/16   Ladell Pier, MD  TRUEPLUS LANCETS 26G MISC 1 drop by Does not apply route 3 (three) times daily before meals. 10/15/16   Ladell Pier, MD    Family History Family History  Problem Relation Age of Onset  . Diabetes Mother   . Hypertension Father   . Diabetes Father   . Heart disease Father     Social History Social History  Substance Use Topics  . Smoking status: Current Every Day Smoker    Packs/day: 2.00    Years: 35.00    Types: Cigars  . Smokeless tobacco: Current User     Comment: Smoking 1-2 small cigars daily  . Alcohol use 0.6 oz/week    1 Shots of liquor per week     Comment: occasionally, 1 beer a day     Allergies   Metformin and related   Review of Systems Review of Systems  Constitutional: Negative for fever.  Respiratory: Negative for shortness of breath.   Cardiovascular: Negative for chest pain and leg swelling.  Gastrointestinal: Negative for abdominal pain, nausea and vomiting.  Genitourinary: Negative for difficulty urinating.  Neurological: Positive for syncope and light-headedness.  Hematological: Does not bruise/bleed easily.  Psychiatric/Behavioral: Negative for confusion.  All other systems reviewed and are negative.    Physical Exam Updated Vital Signs BP (!) 159/95 (BP Location: Left Arm)   Pulse 76   Temp 97.6 F (36.4 C) (Oral)   Resp 16   SpO2 100%   Physical Exam Physical Exam  Nursing note and vitals reviewed. Constitutional: Well developed, well nourished, non-toxic, and in no acute distress Head: Normocephalic and atraumatic.  Mouth/Throat: Oropharynx is clear and moist.  Neck: Normal range  of motion. Neck supple.  Cardiovascular: Normal rate and regular rhythm.   Pulmonary/Chest: Effort normal and breath sounds normal.  Abdominal: Soft. There is no tenderness. There is no rebound and no guarding.  Musculoskeletal: Normal range of motion.  Neurological: Alert, no facial droop, fluent speech, moves all extremities symmetrically Skin: Skin is warm and dry.  Psychiatric: Cooperative   ED Treatments / Results  Labs (all labs ordered are listed, but only abnormal results are displayed) Labs Reviewed  BASIC METABOLIC PANEL - Abnormal; Notable for the following:       Result Value   Sodium 134 (*)    Chloride 100 (*)    Glucose, Bld 193 (*)    BUN 27 (*)    Creatinine, Ser 1.77 (*)    GFR calc non Af Amer 42 (*)    GFR calc Af Amer 49 (*)    All other components within normal limits  CBC - Abnormal; Notable for the following:    RBC 4.07 (*)    Hemoglobin 10.5 (*)  HCT 32.3 (*)    MCH 25.8 (*)    RDW 16.0 (*)    All other components within normal limits  URINALYSIS, ROUTINE W REFLEX MICROSCOPIC - Abnormal; Notable for the following:    Glucose, UA 150 (*)    Hgb urine dipstick SMALL (*)    All other components within normal limits  CBG MONITORING, ED - Abnormal; Notable for the following:    Glucose-Capillary 232 (*)    All other components within normal limits  DIFFERENTIAL  I-STAT TROPOININ, ED    EKG  EKG Interpretation  Date/Time:  Monday November 02 2016 16:12:43 EDT Ventricular Rate:  115 PR Interval:  148 QRS Duration: 80 QT Interval:  324 QTC Calculation: 448 R Axis:   94 Text Interpretation:  Sinus tachycardia Right atrial enlargement Rightward axis Septal infarct , age undetermined Abnormal ECG new rightward axis with sinus tachycardia Confirmed by Brantley Stage [phone]) on 11/02/2016 5:06:09 PM       Radiology No results found.  Procedures Procedures (including critical care time)  Medications Ordered in ED Medications  sodium chloride 0.9 %  bolus 1,000 mL (0 mLs Intravenous Stopped 11/02/16 1825)  sodium chloride 0.9 % bolus 1,000 mL (1,000 mLs Intravenous New Bag/Given 11/02/16 2029)     Initial Impression / Assessment and Plan / ED Course  I have reviewed the triage vital signs and the nursing notes.  Pertinent labs & imaging results that were available during my care of the patient were reviewed by me and considered in my medical decision making (see chart for details).     50 year old male who presents with syncopal episode. He is nontoxic in no acute distress on presentation, mentating normally. Does have orthostatic hypotension on arrival to the ED, likely etiology of his symptoms. His EKG shows no major stigmata of arrhythmia and just sinus tachycardia. Blood work shows AKI with creatinine of 1.7, which supports potential dehydration. No significant anemia.  Given 1L IVF with persistent orthostasis and lightheadedness when standing. Received 2nd liter of fluids and feels significantly improved. Ambulatory in ED.  Felt stable with discharge home. Strict return and follow-up instructions reviewed. He expressed understanding of all discharge instructions and felt comfortable with the plan of care.   Final Clinical Impressions(s) / ED Diagnoses   Final diagnoses:  Syncope and collapse  Orthostatic hypotension    New Prescriptions New Prescriptions   No medications on file     Forde Dandy, MD 11/02/16 2233

## 2016-11-02 NOTE — ED Triage Notes (Signed)
Pt reports feeling weak this am, pt had syncopal episode this am at work and reports low bp. Reports feeling lightheaded and mild headache while at triage, bp is 77/56 at triage.

## 2016-12-03 MED FILL — TRUE METRIX TEST STRIP: 30 days supply | Qty: 50 | Fill #1

## 2016-12-09 ENCOUNTER — Ambulatory Visit: Payer: Self-pay

## 2016-12-21 MED FILL — TRUE METRIX TEST STRIP: 30 days supply | Qty: 100 | Fill #2

## 2016-12-31 ENCOUNTER — Telehealth: Payer: Self-pay | Admitting: *Deleted

## 2016-12-31 ENCOUNTER — Encounter (HOSPITAL_COMMUNITY): Payer: Self-pay | Admitting: Emergency Medicine

## 2016-12-31 ENCOUNTER — Other Ambulatory Visit: Payer: Self-pay | Admitting: Internal Medicine

## 2016-12-31 ENCOUNTER — Ambulatory Visit: Payer: Self-pay | Attending: Internal Medicine | Admitting: Physician Assistant

## 2016-12-31 ENCOUNTER — Emergency Department (HOSPITAL_COMMUNITY)
Admission: EM | Admit: 2016-12-31 | Discharge: 2016-12-31 | Disposition: A | Discharge status: Home / Self Care | Payer: Self-pay | Attending: Emergency Medicine | Admitting: Emergency Medicine

## 2016-12-31 VITALS — BP 83/57 | HR 134

## 2016-12-31 DIAGNOSIS — G8929 Other chronic pain: Secondary | ICD-10-CM | POA: Insufficient documentation

## 2016-12-31 DIAGNOSIS — M549 Dorsalgia, unspecified: Secondary | ICD-10-CM | POA: Insufficient documentation

## 2016-12-31 DIAGNOSIS — E118 Type 2 diabetes mellitus with unspecified complications: Secondary | ICD-10-CM

## 2016-12-31 DIAGNOSIS — I10 Essential (primary) hypertension: Secondary | ICD-10-CM | POA: Insufficient documentation

## 2016-12-31 DIAGNOSIS — R42 Dizziness and giddiness: Secondary | ICD-10-CM | POA: Insufficient documentation

## 2016-12-31 DIAGNOSIS — E1149 Type 2 diabetes mellitus with other diabetic neurological complication: Secondary | ICD-10-CM

## 2016-12-31 DIAGNOSIS — E1165 Type 2 diabetes mellitus with hyperglycemia: Secondary | ICD-10-CM

## 2016-12-31 DIAGNOSIS — I959 Hypotension, unspecified: Secondary | ICD-10-CM | POA: Insufficient documentation

## 2016-12-31 DIAGNOSIS — Z79899 Other long term (current) drug therapy: Secondary | ICD-10-CM | POA: Insufficient documentation

## 2016-12-31 DIAGNOSIS — R531 Weakness: Secondary | ICD-10-CM | POA: Insufficient documentation

## 2016-12-31 DIAGNOSIS — Z794 Long term (current) use of insulin: Secondary | ICD-10-CM

## 2016-12-31 DIAGNOSIS — Z7982 Long term (current) use of aspirin: Secondary | ICD-10-CM | POA: Insufficient documentation

## 2016-12-31 DIAGNOSIS — IMO0002 Reserved for concepts with insufficient information to code with codable children: Secondary | ICD-10-CM

## 2016-12-31 DIAGNOSIS — M542 Cervicalgia: Secondary | ICD-10-CM | POA: Insufficient documentation

## 2016-12-31 DIAGNOSIS — R0602 Shortness of breath: Secondary | ICD-10-CM | POA: Insufficient documentation

## 2016-12-31 DIAGNOSIS — F17290 Nicotine dependence, other tobacco product, uncomplicated: Secondary | ICD-10-CM | POA: Insufficient documentation

## 2016-12-31 DIAGNOSIS — R739 Hyperglycemia, unspecified: Secondary | ICD-10-CM

## 2016-12-31 DIAGNOSIS — E785 Hyperlipidemia, unspecified: Secondary | ICD-10-CM | POA: Insufficient documentation

## 2016-12-31 LAB — URINALYSIS, ROUTINE W REFLEX MICROSCOPIC
Bilirubin Urine: NEGATIVE
Glucose, UA: >=500 mg/dL — AB
Ketones, ur: NEGATIVE mg/dL
Leukocytes, UA: NEGATIVE
Nitrite: NEGATIVE
PROTEIN: 100 mg/dL — AB
SPECIFIC GRAVITY, URINE: 1.014 (ref 1.005–1.030)
pH: 6 (ref 5.0–8.0)

## 2016-12-31 LAB — BASIC METABOLIC PANEL
Anion gap: 9 (ref 5–15)
BUN: 13 mg/dL (ref 6–20)
CHLORIDE: 102 mmol/L (ref 101–111)
CO2: 25 mmol/L (ref 22–32)
Calcium: 9.1 mg/dL (ref 8.9–10.3)
Creatinine, Ser: 1.41 mg/dL — ABNORMAL HIGH (ref 0.61–1.24)
GFR calc non Af Amer: 55 mL/min — ABNORMAL LOW (ref >60)
Glucose, Bld: 330 mg/dL — ABNORMAL HIGH (ref 65–99)
Potassium: 4.7 mmol/L (ref 3.5–5.1)
SODIUM: 136 mmol/L (ref 135–145)

## 2016-12-31 LAB — TROPONIN I: TROPONIN I: 0.03 ng/mL — AB (ref <0.03)

## 2016-12-31 LAB — CBC
HCT: 31.6 % — ABNORMAL LOW (ref 39.0–52.0)
HEMOGLOBIN: 10.3 g/dL — AB (ref 13.0–17.0)
MCH: 27 pg (ref 26.0–34.0)
MCHC: 32.6 g/dL (ref 30.0–36.0)
MCV: 82.9 fL (ref 78.0–100.0)
Platelets: 306 10*3/uL (ref 150–400)
RBC: 3.81 MIL/uL — AB (ref 4.22–5.81)
RDW: 17.7 % — ABNORMAL HIGH (ref 11.5–15.5)
WBC: 5 10*3/uL (ref 4.0–10.5)

## 2016-12-31 LAB — GLUCOSE, POCT (MANUAL RESULT ENTRY): POC GLUCOSE: 337 mg/dL — AB (ref 70–99)

## 2016-12-31 MED ORDER — SODIUM CHLORIDE 0.9 % IV BOLUS (SEPSIS)
1000.0000 mL | Freq: Once | INTRAVENOUS | Status: AC
  Administered 2016-12-31: 1000 mL via INTRAVENOUS

## 2016-12-31 MED ORDER — INSULIN GLARGINE 100 UNIT/ML ~~LOC~~ SOLN: 40.0000 [IU] | Freq: Two times a day (BID) | SUBCUTANEOUS | 3 refills | Status: DC

## 2016-12-31 MED ORDER — GABAPENTIN 300 MG PO CAPS: 600.0000 mg | ORAL_CAPSULE | Freq: Three times a day (TID) | ORAL | 3 refills | Status: DC

## 2016-12-31 MED ORDER — INSULIN ASPART 100 UNIT/ML ~~LOC~~ SOLN: 0.0000 [IU] | Freq: Three times a day (TID) | SUBCUTANEOUS | 3 refills | Status: DC

## 2016-12-31 NOTE — ED Provider Notes (Signed)
[facility] DEPT Provider Note   CSN: [id number] Arrival date & time: 12/31/16  1317     History   Chief Complaint Chief Complaint  Patient presents with  . Hypotension  . Dizziness  . Hyperglycemia    HPI Cody Campbell is a 51 y.o. male.  [facility] with complaint of hypotension, dizziness, weakness. Patient states that over the past 2 weeks he has been feeling weak and dizzy. He presented to his PCP today where he described worsening symptoms over the past day. He was found to have a low blood pressure initially 78/40. He was given IV fluids and transported to the hospital by EMS. Patient reports that his blood sugar was low this morning after waking. He had blurry vision and no his blood sugar was low. Patient ate a snack to help increase his blood sugar. He skipped his insulin today. He denies recent fevers, URI symptoms, frequent cough, chest pains, abdominal pains, diarrhea. No urinary symptoms. He states that he has chronic pain in his back and neck. This is at baseline. The onset of this condition was acute. The course is worsening. Aggravating factors: none. Alleviating factors: none.        Past Medical History:  Diagnosis Date  . Diabetes mellitus without complication ([place])   . Elevated LFTs 05/21/2011  . Hyperlipidemia   . Hypertension     Patient Active Problem List   Diagnosis Date Noted  . Tinnitus of both ears 10/15/2016  . Plantar fascial fibromatosis of both feet 10/15/2016  . Depression 10/15/2016  . Gum disease 10/15/2016  . Degenerative disc disease, lumbar 05/15/2013  . Diabetic neuropathy ([place]) 03/16/2013  . Fibrous nodule 11/14/2012  . Body aches 10/13/2012  . Hypertension 05/20/2011  . UNSPECIFIED TACHYCARDIA 09/19/2008  . DM (diabetes mellitus), type 2, uncontrolled ([place]) 07/29/2006  . HYPERLIPIDEMIA 07/29/2006  . 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 TOBACCO DEPENDENCE 07/29/2006  . INSOMNIA  NOS 07/29/2006    History reviewed. No pertinent surgical history.     Home Medications    Prior to Admission medications   Medication Sig Start Date End Date Taking? Authorizing Provider  aspirin EC 81 MG tablet Take 81 mg by mouth daily.    [provider]  Aspirin-Salicylamide-Caffeine (BC HEADACHE POWDER PO) Take 1 packet by mouth 2 (two) times daily as needed (pain).    [provider]  Blood Glucose Monitoring Suppl (TRUE METRIX METER) W/DEVICE KIT Check Blood Sugar 4 times daily before meals and at bedtime 11/13/14   Tresa Garter, MD  cyclobenzaprine (FLEXERIL) 10 MG tablet Take 1 tablet (10 mg total) by mouth 3 (three) times daily as needed for muscle spasms. Patient not taking: Reported on 10/15/2016 11/26/15   Arnoldo Morale, MD  escitalopram (LEXAPRO) 10 MG tablet Take 1 tablet (10 mg total) by mouth daily. Patient not taking: Reported on 11/02/2016 10/15/16   Ladell Pier, MD  gabapentin (NEURONTIN) 300 MG capsule Take 2 capsules (600 mg total) by mouth 3 (three) times daily. Patient taking differently: Take 300-600 mg by mouth See admin instructions. Take 1-2 capsules (300-600 mg) by mouth two to three times daily 10/15/16   Ladell Pier, MD  glucose blood (TRUE METRIX BLOOD GLUCOSE TEST) test strip Use as instructed 10/15/16   Ladell Pier, MD  insulin aspart (NOVOLOG) 100 UNIT/ML injection Inject 0-12 Units into the skin 3 (three) times daily with meals. As per sliding scale Patient taking differently: Inject  5-15 Units into the skin 3 (three) times daily before meals. Per sliding scale: CBG <200 5 units, 201-300 10 units, >300 15 units - pt also adjusts for carbs 10/15/16   Ladell Pier, MD  insulin glargine (LANTUS) 100 UNIT/ML injection Inject 0.4 mLs (40 Units total) into the skin 2 (two) times daily. Patient taking differently: Inject 45 Units into the skin See admin instructions. Inject 45 units subcutaneously at bedtime, may also  inject 35-45 units before lunch as needed for CBG >200 10/15/16   Ladell Pier, MD  Insulin Syringe-Needle U-100 (B-D INS SYR ULTRAFINE 1CC/31G) 31G X 5/16" 1 ML MISC Use for administration of insulin. 10/15/16   Ladell Pier, MD  lisinopril (PRINIVIL,ZESTRIL) 5 MG tablet Take 1 tablet (5 mg total) by mouth daily. Patient taking differently: Take 5 mg by mouth daily as needed (SBP >140).  10/15/16   Ladell Pier, MD  oxymetazoline (AFRIN NODRIP SEVERE CONGEST) 0.05 % nasal spray Place 1 spray into both nostrils 2 (two) times daily as needed for congestion.    [provider]  TRUEPLUS LANCETS 26G MISC 1 drop by Does not apply route 3 (three) times daily before meals. 10/15/16   Ladell Pier, MD    Family History Family History  Problem Relation Age of Onset  . Diabetes Mother   . Hypertension Father   . Diabetes Father   . Heart disease Father     Social History Social History  Substance Use Topics  . Smoking status: Current Every Day Smoker    Packs/day: 2.00    Years: 35.00    Types: Cigars  . Smokeless tobacco: Current User     Comment: Smoking 1-2 small cigars daily  . Alcohol use 0.6 oz/week    1 Shots of liquor per week     Comment: occasionally, 1 beer a day     Allergies   Metformin and related   Review of Systems Review of Systems  Constitutional: Negative for fever.  HENT: Negative for rhinorrhea and sore throat.   Eyes: Negative for redness.  Respiratory: Negative for cough and shortness of breath.   Cardiovascular: Negative for chest pain.  Gastrointestinal: Negative for abdominal pain, diarrhea, nausea and vomiting.  Genitourinary: Negative for dysuria.  Musculoskeletal: Negative for myalgias.  Skin: Negative for rash.  Neurological: Positive for dizziness, weakness (Generalized) and light-headedness. Negative for headaches.     Physical Exam Updated Vital Signs BP 140/90   Pulse 90   Resp 12   Ht 6' (1.829 m)   Wt  77.6 kg (171 lb)   SpO2 100%   BMI 23.19 kg/m   Physical Exam  Constitutional: He appears well-developed and well-nourished.  Well-appearing, comfortable  HENT:  Head: Normocephalic and atraumatic.  Mouth/Throat: Oropharynx is clear and moist.  Eyes: Conjunctivae are normal. Right eye exhibits no discharge. Left eye exhibits no discharge.  Neck: Normal range of motion. Neck supple.  Cardiovascular: Normal rate, regular rhythm and normal heart sounds.   No murmur heard. Pulmonary/Chest: Effort normal and breath sounds normal. No respiratory distress. He has no wheezes. He has no rales.  Abdominal: Soft. There is no tenderness. There is no rebound and no guarding.  Musculoskeletal: He exhibits no edema or tenderness.  Neurological: He is alert.  Skin: Skin is warm and dry.  Psychiatric: He has a normal mood and affect.  Depressed affect especially when talking about his chronic pain.  Nursing note and vitals reviewed.  ED Treatments / Results  Labs (all labs ordered are listed, but only abnormal results are displayed) Labs Reviewed  CBC - Abnormal; Notable for the following:       Result Value   RBC 3.81 (*)    Hemoglobin 10.3 (*)    HCT 31.6 (*)    RDW 17.7 (*)    All other components within normal limits  BASIC METABOLIC PANEL - Abnormal; Notable for the following:    Glucose, Bld 330 (*)    Creatinine, Ser 1.41 (*)    GFR calc non Af Amer 55 (*)    All other components within normal limits  URINALYSIS, ROUTINE W REFLEX MICROSCOPIC - Abnormal; Notable for the following:    APPearance HAZY (*)    Glucose, UA >=500 (*)    Hgb urine dipstick SMALL (*)    Protein, ur 100 (*)    Bacteria, UA RARE (*)    Squamous Epithelial / LPF 0-5 (*)    All other components within normal limits  TROPONIN I - Abnormal; Notable for the following:    Troponin I 0.03 (*)    All other components within normal limits    EKG  EKG Interpretation  Date/Time:  Thursday December 31 2016  13:27:19 EDT Ventricular Rate:  94 PR Interval:    QRS Duration: 86 QT Interval:  364 QTC Calculation: 456 R Axis:   85 Text Interpretation:  Sinus rhythm ST elev, probable normal early repol pattern Confirmed by Veryl Speak [phone]) on 12/31/2016 1:58:24 PM       Procedures Procedures (including critical care time)  Medications Ordered in ED Medications  sodium chloride 0.9 % bolus 1,000 mL (1,000 mLs Intravenous New Bag/Given 12/31/16 1432)     Initial Impression / Assessment and Plan / ED Course  I have reviewed the triage vital signs and the nursing notes.  Pertinent labs & imaging results that were available during my care of the patient were reviewed by me and considered in my medical decision making (see chart for details).     Patient seen and examined. Work-up initiated.   Vital signs reviewed and are as follows: BP 140/90   Pulse 90   Resp 12   Ht 6' (1.829 m)   Wt 77.6 kg (171 lb)   SpO2 100%   BMI 23.19 kg/m   Workup reviewed with Cody Campbell. Patient's blood pressure and heart rate have been normal here. He is mildly orthostatic by numbers. Patient has a slightly elevated troponin at 0.03, however patient denies any chest pains or any new changes in weakness or shortness of breath. Patient has felt unwell for approximately 2 weeks. Low suspicion for an anginal equivalent at this point. EKG is abnormal but unchanged from previous. We have very low suspicion for ACS in this patient. Given duration of symptoms (2 weeks), do not feel that he needs delta troponin.  [place] results with patient. He will follow-up with his primary care physician to continue working on his chronic complaints. He feels better after fluids. He does not want to stay in the hospital. He agrees to follow-up as planned.  Patient was counseled to return with severe chest pain, especially if the pain is crushing or pressure-like and spreads to the arms, back, neck, or jaw, or if they have sweating,  nausea, or shortness of breath with the pain. They were encouraged to call 911 with these symptoms.   They were also told to return if their chest pain gets worse  and does not go away with rest, they have an attack of chest pain lasting longer than usual despite rest and treatment with the medications their caregiver has prescribed, if they wake from sleep with chest pain or shortness of breath, if they feel dizzy or faint, if they have chest pain not typical of their usual pain, or if they have any other emergent concerns regarding their health.  The patient verbalized understanding and agreed.    Final Clinical Impressions(s) / ED Diagnoses   Final diagnoses:  Hyperglycemia  Hypotension, unspecified hypotension type  Generalized weakness   Episodic hypotension and tachycardia: Unclear etiology but no associated chest pains or shortness of breath. This was resolved upon emergency department arrival. Sounds like vasovagal response. Patient mildly orthostatic here. Treated with IV fluids. Troponin is borderline elevated at 0.03. No EKG changes. Patient does not have any exertional symptoms, radiating pain or pain at all, vomiting, diaphoresis. Symptoms are very atypical for acute coronary syndrome. No risk factors or objective signs suggestive of DVT or PE. Do not feel that patient needs admission for observation at this point.  Generalized weakness: Unclear etiology at this time. No anemia. No signs of DKA. No signs of urinary tract or other infections. Feel that further workup may be undertaken safely by PCP.  No dangerous or life-threatening conditions suspected or identified by history, physical exam, and by work-up. No indications for hospitalization identified. Patient given return instructions as above and seems reliable to return if the symptoms recur.   New Prescriptions Current Discharge Medication List       Cody Campbell, Hershal Coria 12/31/16 1557    Veryl Speak, MD 12/31/16  [phone]

## 2016-12-31 NOTE — ED Notes (Addendum)
Pt stated that while standing he felt, "a little dizzy". Pt stated that he could "feel it coming on" (dizziness).

## 2016-12-31 NOTE — Progress Notes (Signed)
Patient ID: Cody Campbell, male   DOB: 1965-12-18, 51 y.o.   MRN: [id number]    Cody Campbell, is a 51 y.o. male  XYP:[id number]  RCP:[id number]  DOB - [birth year]  Subjective:  Chief Complaint and HPI: Cody Campbell is a 51 y.o. male here today- Patient presented to office ~1.5 hrs prior to his scheduled appt c/o SOB and feeling extremely dizzy.  I was called by the triage nurse and came to bedside.  He was c/o increasing dizziness and weakness over the last 2 months that worsened acutely late this morning.  He c/o chronic neck and back pain.  I took his BP manually and it was 76/58.  EMS was activated and arrived before EKG could be completed.  He denied CP or HA.  No syncope.  He denies N/V/D.  +smoker and has uncontrolled DM.  He says his blood sugar was "low" this morning-337 in office.   ROS:   Constitutional:  No f/c, No night sweats, No unexplained weight loss. EENT:  No vision changes, No blurry vision, No hearing changes. No mouth, throat, or ear problems.  Respiratory: No cough, + SOB Cardiac: No CP, no palpitations GI:  No abd pain, No N/V/D. GU: No Urinary s/sx Musculoskeletal: No joint pain Neuro: No headache, + dizziness, no motor weakness.  Skin: No rash Endocrine:  No polydipsia. No polyuria.  Psych: Denies SI/HI  No problems updated.  ALLERGIES: Allergies  Allergen Reactions  . Metformin And Related Diarrhea    PAST MEDICAL HISTORY: Past Medical History:  Diagnosis Date  . Diabetes mellitus without complication (HCC)   . Elevated LFTs 05/21/2011  . Hyperlipidemia   . Hypertension     MEDICATIONS AT HOME: Prior to Admission medications   Medication Sig Start Date End Date Taking? Authorizing Provider  aspirin EC 81 MG tablet Take 81 mg by mouth daily.    [provider]  Aspirin-Salicylamide-Caffeine (BC HEADACHE POWDER PO) Take 1 packet by mouth 2 (two) times daily as needed (pain).    [provider]  Blood Glucose Monitoring Suppl  (TRUE METRIX METER) W/DEVICE KIT Check Blood Sugar 4 times daily before meals and at bedtime 11/13/14   Quentin Angst, MD  cyclobenzaprine (FLEXERIL) 10 MG tablet Take 1 tablet (10 mg total) by mouth 3 (three) times daily as needed for muscle spasms. Patient not taking: Reported on 10/15/2016 11/26/15   Jaclyn Shaggy, MD  escitalopram (LEXAPRO) 10 MG tablet Take 1 tablet (10 mg total) by mouth daily. Patient not taking: Reported on 11/02/2016 10/15/16   Marcine Matar, MD  gabapentin (NEURONTIN) 300 MG capsule Take 2 capsules (600 mg total) by mouth 3 (three) times daily. Patient taking differently: Take 300-600 mg by mouth See admin instructions. Take 1-2 capsules (300-600 mg) by mouth two to three times daily 10/15/16   Marcine Matar, MD  glucose blood (TRUE METRIX BLOOD GLUCOSE TEST) test strip Use as instructed 10/15/16   Marcine Matar, MD  insulin aspart (NOVOLOG) 100 UNIT/ML injection Inject 0-12 Units into the skin 3 (three) times daily with meals. As per sliding scale Patient taking differently: Inject 5-15 Units into the skin 3 (three) times daily before meals. Per sliding scale: CBG <200 5 units, 201-300 10 units, >300 15 units - pt also adjusts for carbs 10/15/16   Marcine Matar, MD  insulin glargine (LANTUS) 100 UNIT/ML injection Inject 0.4 mLs (40 Units total) into the skin 2 (two) times daily. Patient taking differently: Inject 45  Units into the skin See admin instructions. Inject 45 units subcutaneously at bedtime, may also inject 35-45 units before lunch as needed for CBG >200 10/15/16   Ladell Pier, MD  Insulin Syringe-Needle U-100 (B-D INS SYR ULTRAFINE 1CC/31G) 31G X 5/16" 1 ML MISC Use for administration of insulin. 10/15/16   Ladell Pier, MD  lisinopril (PRINIVIL,ZESTRIL) 5 MG tablet Take 1 tablet (5 mg total) by mouth daily. Patient taking differently: Take 5 mg by mouth daily as needed (SBP >140).  10/15/16   Ladell Pier, MD  oxymetazoline  (AFRIN NODRIP SEVERE CONGEST) 0.05 % nasal spray Place 1 spray into both nostrils 2 (two) times daily as needed for congestion.    [provider]  TRUEPLUS LANCETS 26G MISC 1 drop by Does not apply route 3 (three) times daily before meals. 10/15/16   Ladell Pier, MD     Objective:  EXAM:   Vitals:   12/31/16 1225  BP: (!) 83/57  Pulse: (!) 134    General appearance : A&OX3. Appears pale and lethargic.   HEENT: Atraumatic and Normocephalic.  PERRLA. EOM intact.  TM clear B. Mouth-MMM, post pharynx WNL w/o erythema, No PND. Neck: supple, no JVD. No cervical lymphadenopathy. No thyromegaly Chest/Lungs:  Breathing-non-labored, Good air entry bilaterally, breath sounds normal without rales, rhonchi, or wheezing  CVS: S1 S2 regular, no murmurs, gallops, rubs, Rat at ~120 when I am seeing him  Abdomen: Bowel sounds present, Non tender and not distended with no gaurding, rigidity or rebound. Extremities: Bilateral Lower Ext shows no edema, both legs are warm to touch with = pulse throughout Neurology:  CN II-XII grossly intact, Non focal.   Psych:  TP linear. J/I WNL. Normal speech. Appropriate eye contact and affect.  Skin:  No Rash  Data Review Lab Results  Component Value Date   HGBA1C 10.7 10/15/2016   HGBA1C 10.9 11/26/2015   HGBA1C 10.0 01/23/2015     Assessment & Plan   1. Essential hypertension with hypotension and dizziness today.   Out via EMS-  2. Uncontrolled type 2 diabetes mellitus with complication, with long-term current use of insulin (HCC) F/up 1 week - Glucose (CBG)  3. Hypotension, unspecified hypotension type Out via EMS  4. Dizziness Out via EMS   Patient have been counseled extensively about nutrition and exercise  Return in about 1 week (around 01/07/2017) for Dr Wynetta Emery for hospital f/up and DM/htn .  The patient was given clear instructions to go to ER or return to medical center if symptoms don't improve, worsen or new problems  develop. The patient verbalized understanding. The patient was told to call to get lab results if they haven't heard anything in the next week.     Cody Caldron, PA-C [facility], [place]   12/31/2016, 1:19 PM

## 2016-12-31 NOTE — Discharge Instructions (Signed)
Please read and follow all provided instructions.  Your diagnoses today include:  1. Hyperglycemia   2. Hypotension, unspecified hypotension type   3. Generalized weakness     Tests performed today include:  Blood counts and electrolytes - elevated blood sugar  EKG - not change from previous  Vital signs. See below for your results today.   Medications prescribed:   None  Take any prescribed medications only as directed.  Home care instructions:  Follow any educational materials contained in this packet.  BE VERY CAREFUL not to take multiple medicines containing Tylenol (also called acetaminophen). Doing so can lead to an overdose which can damage your liver and cause liver failure and possibly death.   Follow-up instructions: Please follow-up with your primary care provider in the next 7 days for further evaluation of your symptoms.   Return instructions:   Please return to the Emergency Department if you experience worsening symptoms.   Please return if you develop worsening chest pain, shortness of breath, if you pass out or other concerns.  Please return if you have any other emergent concerns.  Additional Information:  Your vital signs today were: BP (!) 142/95    Pulse 81    Resp 11    Ht 6' (1.829 m)    Wt 77.6 kg (171 lb)    SpO2 100%    BMI 23.19 kg/m  If your blood pressure (BP) was elevated above 135/85 this visit, please have this repeated by your doctor within one month. --------------

## 2016-12-31 NOTE — ED Triage Notes (Signed)
Pt in from doc's office via [facility] with dizziness, hypotension and hyperglycemia. CBG 337, pt states he's had dizziness x months. Initial BP was 78/40, was given 750 ml's NS and now 148/80. Stopped Lisinopril x 2 wks, no Novolog today. Alert, VSS, MAE's equally

## 2016-12-31 NOTE — Telephone Encounter (Signed)
Pt request medication refill for gabapentin and Lantus.  He takes Lantus twice daily.

## 2016-12-31 NOTE — Progress Notes (Signed)
Pt states he feels faint and weak. Pain r/t neuropathy

## 2017-01-01 ENCOUNTER — Ambulatory Visit: Payer: Self-pay | Attending: Internal Medicine

## 2017-01-01 MED FILL — !NOVOLOG 100UNITS/ML VIAL: 100/ML | 27 days supply | Qty: 10 | Fill #1

## 2017-01-01 MED FILL — LANTUS 100 UNITS/ML VIAL: 100 | 12 days supply | Qty: 10 | Fill #1

## 2017-01-01 MED FILL — GABAPENTIN 300 MG CAPSULE: 300 | 30 days supply | Qty: 180 | Fill #0

## 2017-01-04 ENCOUNTER — Ambulatory Visit: Payer: Self-pay | Attending: Internal Medicine | Admitting: Internal Medicine

## 2017-01-04 ENCOUNTER — Encounter: Payer: Self-pay | Admitting: Internal Medicine

## 2017-01-04 VITALS — BP 127/88 | HR 119 | Temp 98.2°F | Resp 16 | Wt 164.4 lb

## 2017-01-04 DIAGNOSIS — K5900 Constipation, unspecified: Secondary | ICD-10-CM | POA: Insufficient documentation

## 2017-01-04 DIAGNOSIS — H9313 Tinnitus, bilateral: Secondary | ICD-10-CM | POA: Insufficient documentation

## 2017-01-04 DIAGNOSIS — E785 Hyperlipidemia, unspecified: Secondary | ICD-10-CM | POA: Insufficient documentation

## 2017-01-04 DIAGNOSIS — G8929 Other chronic pain: Secondary | ICD-10-CM | POA: Insufficient documentation

## 2017-01-04 DIAGNOSIS — IMO0002 Reserved for concepts with insufficient information to code with codable children: Secondary | ICD-10-CM

## 2017-01-04 DIAGNOSIS — Z114 Encounter for screening for human immunodeficiency virus [HIV]: Secondary | ICD-10-CM | POA: Insufficient documentation

## 2017-01-04 DIAGNOSIS — Z794 Long term (current) use of insulin: Secondary | ICD-10-CM | POA: Insufficient documentation

## 2017-01-04 DIAGNOSIS — N529 Male erectile dysfunction, unspecified: Secondary | ICD-10-CM | POA: Insufficient documentation

## 2017-01-04 DIAGNOSIS — Z79899 Other long term (current) drug therapy: Secondary | ICD-10-CM | POA: Insufficient documentation

## 2017-01-04 DIAGNOSIS — R634 Abnormal weight loss: Secondary | ICD-10-CM

## 2017-01-04 DIAGNOSIS — E118 Type 2 diabetes mellitus with unspecified complications: Secondary | ICD-10-CM

## 2017-01-04 DIAGNOSIS — G4700 Insomnia, unspecified: Secondary | ICD-10-CM | POA: Insufficient documentation

## 2017-01-04 DIAGNOSIS — E860 Dehydration: Secondary | ICD-10-CM | POA: Insufficient documentation

## 2017-01-04 DIAGNOSIS — R42 Dizziness and giddiness: Secondary | ICD-10-CM

## 2017-01-04 DIAGNOSIS — Z888 Allergy status to other drugs, medicaments and biological substances status: Secondary | ICD-10-CM | POA: Insufficient documentation

## 2017-01-04 DIAGNOSIS — M5136 Other intervertebral disc degeneration, lumbar region: Secondary | ICD-10-CM | POA: Insufficient documentation

## 2017-01-04 DIAGNOSIS — M542 Cervicalgia: Secondary | ICD-10-CM | POA: Insufficient documentation

## 2017-01-04 DIAGNOSIS — F329 Major depressive disorder, single episode, unspecified: Secondary | ICD-10-CM | POA: Insufficient documentation

## 2017-01-04 DIAGNOSIS — Z8249 Family history of ischemic heart disease and other diseases of the circulatory system: Secondary | ICD-10-CM | POA: Insufficient documentation

## 2017-01-04 DIAGNOSIS — R000 Tachycardia, unspecified: Secondary | ICD-10-CM | POA: Insufficient documentation

## 2017-01-04 DIAGNOSIS — M722 Plantar fascial fibromatosis: Secondary | ICD-10-CM | POA: Insufficient documentation

## 2017-01-04 DIAGNOSIS — I10 Essential (primary) hypertension: Secondary | ICD-10-CM | POA: Insufficient documentation

## 2017-01-04 DIAGNOSIS — F17290 Nicotine dependence, other tobacco product, uncomplicated: Secondary | ICD-10-CM | POA: Insufficient documentation

## 2017-01-04 DIAGNOSIS — D649 Anemia, unspecified: Secondary | ICD-10-CM | POA: Insufficient documentation

## 2017-01-04 DIAGNOSIS — E1142 Type 2 diabetes mellitus with diabetic polyneuropathy: Secondary | ICD-10-CM | POA: Insufficient documentation

## 2017-01-04 DIAGNOSIS — Z833 Family history of diabetes mellitus: Secondary | ICD-10-CM | POA: Insufficient documentation

## 2017-01-04 DIAGNOSIS — E1165 Type 2 diabetes mellitus with hyperglycemia: Secondary | ICD-10-CM | POA: Insufficient documentation

## 2017-01-04 DIAGNOSIS — Z7982 Long term (current) use of aspirin: Secondary | ICD-10-CM | POA: Insufficient documentation

## 2017-01-04 LAB — GLUCOSE, POCT (MANUAL RESULT ENTRY): POC Glucose: 264 mg/dl — AB (ref 70–99)

## 2017-01-04 MED ORDER — FERROUS SULFATE 325 (65 FE) MG PO TABS: 325.0000 mg | ORAL_TABLET | Freq: Two times a day (BID) | ORAL | 3 refills | Status: DC

## 2017-01-04 MED ORDER — DOCUSATE SODIUM 100 MG PO CAPS: 100.0000 mg | ORAL_CAPSULE | Freq: Two times a day (BID) | ORAL | 1 refills | Status: DC

## 2017-01-04 NOTE — Patient Instructions (Addendum)
Please stop the Lisinopril for now.   Add some salt to the foods.   Write down blood sugar readings and bring them in on follow up visit.   Start taking the iron supplement. Stop eating ice.   Please turn in all supporting documentation so that you can be evaluated for the orange card.

## 2017-01-04 NOTE — Progress Notes (Signed)
Patient ID: Cody Campbell, male    DOB: [birth year]  MRN: [id number]  CC: Hospitalization Follow-up   Subjective: Cody Campbell is a 51 y.o. male who presents for ER f/u His concerns today include:  Hx of HTN, DM type 2 with peripheral neuropathy, depression, tob.  Last saw me 10/15/2016.  1. On last visit patient was concerned about blood pressure and was taking lisinopril from a friend.  -BP was normal on that visit.  Patient given low dose of lisinopril with instructions to take only if blood pressure started increasing above 140/90.  Reports he had to take Lisinopril only 4 x since he last saw me  2. Having spells of dizziness with position changes - standing or sitting up from laying down. Lasts about 1-2 mins -seen in ER 11/02/2016. 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  Felt dizzy.  Drove to hosp.  Found to be dehydrated with Creat 1.77 up from 1.4.  Given 2 Lt IVF with improvement. -Sent to ER 8/2 from our office due to dizziness, weakness and hypotension that started that morning.  Creat was at baseline of 1.4. Trop mildly elevated at 0.03 with EKG unchanged from previous showing some LVH and nonspec TWI in leads III and AVF.  Anemia worsen from May with H/H going from 12.6/37.7 to 10.3/31.6.  Symptoms improved again with IV fluid  -since then dizziness not as bad.   Eating ice several times a day -no BM for 2-3 days. No blood or dark stools.  No N/V -never had colonoscopy -Down 7 pounds from less than a week ago Denies any chronic cough, dysphagia, abdominal pain. Does have intermittent constipation. Chronic neck pain. No fevers or night sweats  DM: -checks BS several times a day.  Gives a.m range of 80. During the day BS in 180-200 -taking Lantus BID 35-45 units BID and Novolog he takes 2 x a day.  Takes 5-10 units on average.   Patient Active Problem List   Diagnosis Date Noted  . Tinnitus of both ears 10/15/2016  . Plantar fascial fibromatosis of both feet  10/15/2016  . Depression 10/15/2016  . Gum disease 10/15/2016  . Degenerative disc disease, lumbar 05/15/2013  . Diabetic neuropathy ([place]) 03/16/2013  . Fibrous nodule 11/14/2012  . Body aches 10/13/2012  . Hypertension 05/20/2011  . UNSPECIFIED TACHYCARDIA 09/19/2008  . DM (diabetes mellitus), type 2, uncontrolled ([place]) 07/29/2006  . HYPERLIPIDEMIA 07/29/2006  . 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 TOBACCO DEPENDENCE 07/29/2006  . INSOMNIA NOS 07/29/2006     Current Outpatient Prescriptions on File Prior to Visit  Medication Sig Dispense Refill  . aspirin EC 81 MG tablet Take 81 mg by mouth daily.    . Aspirin-Salicylamide-Caffeine (BC HEADACHE POWDER PO) Take 1 packet by mouth 2 (two) times daily as needed (pain).    . Blood Glucose Monitoring Suppl (TRUE METRIX METER) W/DEVICE KIT Check Blood Sugar 4 times daily before meals and at bedtime 1 kit 0  . gabapentin (NEURONTIN) 300 MG capsule Take 2 capsules (600 mg total) by mouth 3 (three) times daily. 180 capsule 3  . glucose blood (TRUE METRIX BLOOD GLUCOSE TEST) test strip Use as instructed 100 each 12  . insulin aspart (NOVOLOG) 100 UNIT/ML injection Inject 0-12 Units into the skin 3 (three) times daily with meals. As per sliding scale 3 vial 3  . insulin glargine (LANTUS) 100 UNIT/ML injection Inject 0.4 mLs (40 Units total) into the skin 2 (two)  times daily. 40 mL 3  . Insulin Syringe-Needle U-100 (B-D INS SYR ULTRAFINE 1CC/31G) 31G X 5/16" 1 ML MISC Use for administration of insulin. 100 each 12  . lisinopril (PRINIVIL,ZESTRIL) 5 MG tablet Take 1 tablet (5 mg total) by mouth daily. (Patient taking differently: Take 5 mg by mouth daily as needed (SBP >140). ) 30 tablet 3  . oxymetazoline (AFRIN NODRIP SEVERE CONGEST) 0.05 % nasal spray Place 1 spray into both nostrils 2 (two) times daily as needed for congestion.    . TRUEPLUS LANCETS 26G MISC 1 drop by Does not apply route 3 (three) times daily before meals. 100 each 3   Current  Facility-Administered Medications on File Prior to Visit  Medication Dose Route Frequency Provider Last Rate Last Dose  . aspirin chewable tablet 81 mg  81 mg Oral Once Ladell Pier, MD        Allergies  Allergen Reactions  . Metformin And Related Diarrhea    Social History   Social History  . Marital status: Married    Spouse name: N/A  . Number of children: N/A  . Years of education: N/A   Occupational History  . Not on file.   Social History Main Topics  . Smoking status: Current Every Day Smoker    Packs/day: 2.00    Years: 35.00    Types: Cigars  . Smokeless tobacco: Current User     Comment: Smoking 1-2 small cigars daily  . Alcohol use 0.6 oz/week    1 Shots of liquor per week     Comment: occasionally, 1 beer a day  . Drug use: Yes    Types: Marijuana  . Sexual activity: Yes    Partners: Female    Birth control/ protection: Condom   Other Topics Concern  . Not on file   Social History Narrative  . No narrative on file    Family History  Problem Relation Age of Onset  . Diabetes Mother   . Hypertension Father   . Diabetes Father   . Heart disease Father     No past surgical history on file.  ROS: Review of Systems Negative except as stated above  PHYSICAL EXAM: BP 127/88   Pulse (!) 119   Temp 98.2 F (36.8 C) (Oral)   Resp 16   Wt 164 lb 6.4 oz (74.6 kg)   SpO2 100%   BMI 22.30 kg/m   Pulse 104 Wt Readings from Last 3 Encounters:  01/04/17 164 lb 6.4 oz (74.6 kg)  12/31/16 171 lb (77.6 kg)  10/15/16 171 lb 9.6 oz (77.8 kg)  BP laying 109/76, sitting 125/82 and standing 105/73  Physical Exam  General appearance - alert, well appearing, middle age AAM and in no distress Mental status - alert, oriented to person, place, and time, normal mood, behavior, speech, dress, motor activity, and thought processes Eyes -slightly pale conjunctiva Mouth - mucous membranes moist, pharynx normal without lesions Neck - supple, no  significant adenopathy or thyroid nodule/enlargement Chest - clear to auscultation, no wheezes, rales or rhonchi, symmetric air entry Heart -tachy and regular Abdomen - soft, nontender, nondistended, no masses or organomegaly Rectal - negative without mass, lesions or tenderness.  Heme neg Extremities - no LE edema  Depression screen PHQ 2/9 01/04/2017  Decreased Interest 1  Down, Depressed, Hopeless 1  PHQ - 2 Score 2  Altered sleeping 2  Tired, decreased energy 2  Change in appetite 1  Feeling bad or failure about yourself  1  Trouble concentrating 2  Moving slowly or fidgety/restless 0  Suicidal thoughts 0  PHQ-9 Score 10  Difficult doing work/chores -   GAD 7 : Generalized Anxiety Score 01/04/2017 10/15/2016  Nervous, Anxious, on Edge 0 2  Control/stop worrying 2 2  Worry too much - different things 1 2  Trouble relaxing 2 2  Restless 0 0  Easily annoyed or irritable 0 1  Afraid - awful might happen 0 1  Total GAD 7 Score 5 10  Anxiety Difficulty - Somewhat difficult    Results for orders placed or performed in visit on 01/04/17  Iron, TIBC and Ferritin Panel  Result Value Ref Range   Total Iron Binding Capacity 393 250 - 450 ug/dL   UIBC 357 (H) 111 - 343 ug/dL   Iron 36 (L) 38 - 169 ug/dL   Iron Saturation 9 (LL) 15 - 55 %   Ferritin 15 (L) 30 - 400 ng/mL  CBC  Result Value Ref Range   WBC 5.2 3.4 - 10.8 x10E3/uL   RBC 4.17 4.14 - 5.80 x10E6/uL   Hemoglobin 11.3 (L) 13.0 - 17.7 g/dL   Hematocrit 36.1 (L) 37.5 - 51.0 %   MCV 87 79 - 97 fL   MCH 27.1 26.6 - 33.0 pg   MCHC 31.3 (L) 31.5 - 35.7 g/dL   RDW 19.1 (H) 12.3 - 15.4 %   Platelets 348 150 - 379 x10E3/uL  Hemoglobin A1c  Result Value Ref Range   Hgb A1c MFr Bld 9.9 (H) 4.8 - 5.6 %   Est. average glucose Bld gHb Est-mCnc 237 mg/dL  HIV antibody  Result Value Ref Range   HIV Screen 4th Generation wRfx Non Reactive Non Reactive  TSH  Result Value Ref Range   TSH 2.770 0.450 - 4.500 uIU/mL  POCT glucose  (manual entry)  Result Value Ref Range   POC Glucose 264 (A) 70 - 99 mg/dl   Lab Results  Component Value Date   HGBA1C 9.9 (H) 01/04/2017   Results for orders placed or performed in visit on 01/04/17  Iron, TIBC and Ferritin Panel  Result Value Ref Range   Total Iron Binding Capacity 393 250 - 450 ug/dL   UIBC 357 (H) 111 - 343 ug/dL   Iron 36 (L) 38 - 169 ug/dL   Iron Saturation 9 (LL) 15 - 55 %   Ferritin 15 (L) 30 - 400 ng/mL  CBC  Result Value Ref Range   WBC 5.2 3.4 - 10.8 x10E3/uL   RBC 4.17 4.14 - 5.80 x10E6/uL   Hemoglobin 11.3 (L) 13.0 - 17.7 g/dL   Hematocrit 36.1 (L) 37.5 - 51.0 %   MCV 87 79 - 97 fL   MCH 27.1 26.6 - 33.0 pg   MCHC 31.3 (L) 31.5 - 35.7 g/dL   RDW 19.1 (H) 12.3 - 15.4 %   Platelets 348 150 - 379 x10E3/uL  Hemoglobin A1c  Result Value Ref Range   Hgb A1c MFr Bld 9.9 (H) 4.8 - 5.6 %   Est. average glucose Bld gHb Est-mCnc 237 mg/dL  HIV antibody  Result Value Ref Range   HIV Screen 4th Generation wRfx Non Reactive Non Reactive  TSH  Result Value Ref Range   TSH 2.770 0.450 - 4.500 uIU/mL  POCT glucose (manual entry)  Result Value Ref Range   POC Glucose 264 (A) 70 - 99 mg/dl      ASSESSMENT AND PLAN: 1. Uncontrolled type 2 diabetes mellitus with complication,  with long-term current use of insulin ([place]) -since he is checking BS several times a day, I have requested that he writes down readings and bring in on next visit in 2 wks for my review.  Will give better guidance on how to adjust his insulin - POCT glucose (manual entry) - Hemoglobin A1c  2. Dizziness -pt with mild to moderate anemia but doubt this degree of anemia would be causing him to be this symptomatic and orthostatic. Clearly some dehydration on recent two ER visits. Tachy today with orthostatic readings in going from sitting to standing -start iron supplement. -hold off on Lisinopril which he has not had to use much.   -encourage fluid intake  During the day  Add little  salt intake to foods  3. Anemia, unspecified type -stop eating ice Start Iron supplement.  Check iron/ferritin levels - Iron, TIBC and Ferritin Panel - CBC - ferrous sulfate (FERROUSUL) 325 (65 FE) MG tablet; Take 1 tablet (325 mg total) by mouth 2 (two) times daily with a meal.  Dispense: 60 tablet; Refill: 3 - Ambulatory referral to Gastroenterology  4. Screening for HIV (human immunodeficiency virus) - HIV antibody  5. Weight loss This combined with anemia is concerning.  Needs EDG/colonoscopy.  Advised to complete paper work for [employer] so that we can move forward with referral to GI - HIV antibody - Ambulatory referral to Gastroenterology - TSH   Patient was given the opportunity to ask questions.  Patient verbalized understanding of the plan and was able to repeat key elements of the plan.   Orders Placed This Encounter  Procedures  . Iron, TIBC and Ferritin Panel  . CBC  . Hemoglobin A1c  . HIV antibody  . TSH  . Ambulatory referral to Gastroenterology  . POCT glucose (manual entry)     Requested Prescriptions   Signed Prescriptions Disp Refills  . docusate sodium (COLACE) 100 MG capsule 60 capsule 1    Sig: Take 1 capsule (100 mg total) by mouth 2 (two) times daily.  . ferrous sulfate (FERROUSUL) 325 (65 FE) MG tablet 60 tablet 3    Sig: Take 1 tablet (325 mg total) by mouth 2 (two) times daily with a meal.    Return in about 2 weeks (around 01/18/2017).  Karle Plumber, MD, FACP

## 2017-01-05 DIAGNOSIS — R42 Dizziness and giddiness: Secondary | ICD-10-CM | POA: Insufficient documentation

## 2017-01-05 DIAGNOSIS — D649 Anemia, unspecified: Secondary | ICD-10-CM | POA: Insufficient documentation

## 2017-01-05 LAB — CBC
Hematocrit: 36.1 % — ABNORMAL LOW (ref 37.5–51.0)
Hemoglobin: 11.3 g/dL — ABNORMAL LOW (ref 13.0–17.7)
MCH: 27.1 pg (ref 26.6–33.0)
MCHC: 31.3 g/dL — ABNORMAL LOW (ref 31.5–35.7)
MCV: 87 fL (ref 79–97)
PLATELETS: 348 10*3/uL (ref 150–379)
RBC: 4.17 x10E6/uL (ref 4.14–5.80)
RDW: 19.1 % — AB (ref 12.3–15.4)
WBC: 5.2 10*3/uL (ref 3.4–10.8)

## 2017-01-05 LAB — HIV ANTIBODY (ROUTINE TESTING W REFLEX): HIV SCREEN 4TH GENERATION: NONREACTIVE

## 2017-01-05 LAB — TSH: TSH: 2.77 u[IU]/mL (ref 0.450–4.500)

## 2017-01-05 LAB — IRON,TIBC AND FERRITIN PANEL
Ferritin: 15 ng/mL — ABNORMAL LOW (ref 30–400)
IRON: 36 ug/dL — AB (ref 38–169)
Iron Saturation: 9 % — CL (ref 15–55)
Total Iron Binding Capacity: 393 ug/dL (ref 250–450)
UIBC: 357 ug/dL — AB (ref 111–343)

## 2017-01-05 LAB — HEMOGLOBIN A1C
ESTIMATED AVERAGE GLUCOSE: 237 mg/dL
Hgb A1c MFr Bld: 9.9 % — ABNORMAL HIGH (ref 4.8–5.6)

## 2017-01-22 ENCOUNTER — Ambulatory Visit: Payer: Self-pay | Admitting: Internal Medicine

## 2017-01-25 MED FILL — TRUE METRIX TEST STRIP: 30 days supply | Qty: 100 | Fill #3

## 2017-03-03 MED FILL — !LANTUS 100 UNITS/ML VIAL: 100 | 25 days supply | Qty: 20 | Fill #0

## 2017-03-03 MED FILL — TRUE METRIX TEST STRIP: 30 days supply | Qty: 100 | Fill #4

## 2017-03-03 MED FILL — !NOVOLOG 100UNITS/ML VIAL: 100/ML | 27 days supply | Qty: 10 | Fill #0

## 2017-03-03 MED FILL — GABAPENTIN 300 MG CAPSULE: 300 | 30 days supply | Qty: 180 | Fill #1

## 2017-07-13 MED FILL — GABAPENTIN 300 MG CAPSULE: 300 | 30 days supply | Qty: 180 | Fill #2

## 2017-07-13 MED FILL — !LANTUS 100 UNITS/ML VIAL: 100 | 25 days supply | Qty: 20 | Fill #1

## 2017-09-07 MED FILL — !LANTUS 100 UNITS/ML VIAL: 100 | 25 days supply | Qty: 20 | Fill #2

## 2017-09-07 MED FILL — GABAPENTIN 300 MG CAPSULE: 300 | 30 days supply | Qty: 180 | Fill #3

## 2017-11-03 ENCOUNTER — Other Ambulatory Visit: Payer: Self-pay | Admitting: Internal Medicine

## 2017-11-03 DIAGNOSIS — E1149 Type 2 diabetes mellitus with other diabetic neurological complication: Secondary | ICD-10-CM

## 2017-11-03 MED FILL — !LANTUS 100 UNITS/ML VIAL: 100 | 25 days supply | Qty: 20 | Fill #3

## 2018-01-25 ENCOUNTER — Telehealth: Payer: Self-pay | Admitting: *Deleted

## 2018-01-25 DIAGNOSIS — E1165 Type 2 diabetes mellitus with hyperglycemia: Secondary | ICD-10-CM

## 2018-01-25 NOTE — Telephone Encounter (Signed)
Medical Assistant left message on patient's home and cell voicemail. Voicemail states to give a call back to [place] with [facility] at [phone]. Patient should be scheduled with PCP for OV and fasting labs appointment. Labs are in the system.

## 2019-01-16 ENCOUNTER — Other Ambulatory Visit: Payer: Self-pay | Admitting: Internal Medicine

## 2019-01-16 DIAGNOSIS — IMO0002 Reserved for concepts with insufficient information to code with codable children: Secondary | ICD-10-CM

## 2019-01-16 DIAGNOSIS — E1149 Type 2 diabetes mellitus with other diabetic neurological complication: Secondary | ICD-10-CM

## 2019-01-16 DIAGNOSIS — E1165 Type 2 diabetes mellitus with hyperglycemia: Secondary | ICD-10-CM

## 2019-01-16 MED FILL — !LANTUS 100 UNITS/ML VIAL: 100 | 12 days supply | Qty: 10 | Fill #0

## 2019-01-16 NOTE — Telephone Encounter (Signed)
Pt states one day supply left of insulin. Request script to [place]. Pt has appt [facility] 01/25/19. Pt has appt re-establish care [facility] 03/07/19.  Confirmed pt phone.

## 2019-01-16 NOTE — Telephone Encounter (Signed)
Will give enough Lantus to last until next week's appointment. Pt must wait to be seen before refills of gabapentin can be sent.

## 2019-01-25 ENCOUNTER — Other Ambulatory Visit: Payer: Self-pay

## 2019-01-25 ENCOUNTER — Ambulatory Visit: Payer: Self-pay | Attending: Family Medicine | Admitting: Physician Assistant

## 2019-01-25 VITALS — BP 96/65 | HR 107 | Temp 98.4°F | Resp 18 | Wt 169.0 lb

## 2019-01-25 DIAGNOSIS — I10 Essential (primary) hypertension: Secondary | ICD-10-CM

## 2019-01-25 DIAGNOSIS — E1165 Type 2 diabetes mellitus with hyperglycemia: Secondary | ICD-10-CM

## 2019-01-25 DIAGNOSIS — H00015 Hordeolum externum left lower eyelid: Secondary | ICD-10-CM

## 2019-01-25 DIAGNOSIS — Z91199 Patient's noncompliance with other medical treatment and regimen due to unspecified reason: Secondary | ICD-10-CM

## 2019-01-25 DIAGNOSIS — E1149 Type 2 diabetes mellitus with other diabetic neurological complication: Secondary | ICD-10-CM

## 2019-01-25 DIAGNOSIS — Z9119 Patient's noncompliance with other medical treatment and regimen: Secondary | ICD-10-CM

## 2019-01-25 LAB — GLUCOSE, POCT (MANUAL RESULT ENTRY): POC Glucose: 104 mg/dl — AB (ref 70–99)

## 2019-01-25 LAB — POCT GLYCOSYLATED HEMOGLOBIN (HGB A1C): Hemoglobin A1C: 10.1 % — AB (ref 4.0–5.6)

## 2019-01-25 MED ORDER — CEPHALEXIN 500 MG PO CAPS: 500.0000 mg | ORAL_CAPSULE | Freq: Four times a day (QID) | ORAL | 0 refills | Status: DC

## 2019-01-25 MED ORDER — LISINOPRIL 5 MG PO TABS: 2.5000 mg | ORAL_TABLET | Freq: Every day | ORAL | 3 refills | Status: DC

## 2019-01-25 MED ORDER — INSULIN ASPART 100 UNIT/ML ~~LOC~~ SOLN: 0.0000 [IU] | Freq: Three times a day (TID) | SUBCUTANEOUS | 3 refills | Status: DC

## 2019-01-25 MED ORDER — GABAPENTIN 300 MG PO CAPS
600.0000 mg | ORAL_CAPSULE | Freq: Three times a day (TID) | ORAL | 3 refills | Status: DC
Start: 2019-01-25 — End: 2019-04-07

## 2019-01-25 MED ORDER — INSULIN GLARGINE 100 UNIT/ML ~~LOC~~ SOLN: 40.0000 [IU] | Freq: Two times a day (BID) | SUBCUTANEOUS | 0 refills | Status: DC

## 2019-01-25 MED ORDER — "INSULIN SYRINGE-NEEDLE U-100 31G X 5/16"" 1 ML MISC": 12 refills | Status: AC

## 2019-01-25 MED FILL — TRUEPLUS SYR 1ML 31GX5/16": 31G X 5/16" | 30 days supply | Qty: 100 | Fill #0

## 2019-01-25 MED FILL — !NOVOLOG 100UNITS/ML VIAL: 100/ML | 27 days supply | Qty: 10 | Fill #0

## 2019-01-25 MED FILL — TRUEPLUS SYR 1ML 31GX5/16: 31G X 5/16" | 30 days supply | Qty: 100 | Fill #0

## 2019-01-25 MED FILL — LISINOPRIL 5 MG TAB: 5 | 30 days supply | Qty: 15 | Fill #0

## 2019-01-25 MED FILL — GABAPENTIN 300 MG CAPSULE: 300 | 30 days supply | Qty: 180 | Fill #0

## 2019-01-25 MED FILL — CEPHALEXIN 500 MG CAPSULE: 500 | 5 days supply | Qty: 21 | Fill #0

## 2019-01-25 MED FILL — !LANTUS 100 UNITS/ML VIAL: 100 | 12 days supply | Qty: 10 | Fill #0

## 2019-01-25 NOTE — Progress Notes (Signed)
Patient ID: Cody Campbell. Cody Campbell, male   DOB: Apr 17, 1966, 53 y.o.   MRN: [id number]   Cody Campbell, is a 53 y.o. male  [id number]:[id number]  [id number]  DOB - 1965/11/02  Subjective:  Chief Complaint and HPI: Cody Campbell is a 53 y.o. male here today needing med RF.  He has been taking a cousins meds from [facility] but recently the cousin contracted Covid and died and he is running out of meds.  He states ~50% compliance with meds.  Denies s/sx hyper/hypoglycemia.    C/o neuropathy B feet and needs gabapentin RF.  He is having trouble climbing ladders and walking distances at work.  Works at a nursing home.    Stye in L eye lower lid-compresses not helping.    ROS:   Constitutional:  No f/c, No night sweats, No unexplained weight loss. EENT:  No vision changes, No blurry vision, No hearing changes. No mouth, throat, or ear problems.  Respiratory: No cough, No SOB Cardiac: No CP, no palpitations GI:  No abd pain, No N/V/D. GU: No Urinary s/sx Musculoskeletal: No joint pain Neuro: No headache, no dizziness, no motor weakness.  Skin: No rash Endocrine:  No polydipsia. No polyuria.  Psych: Denies SI/HI  No problems updated.  ALLERGIES: Allergies  Allergen Reactions  . Metformin And Related Diarrhea    PAST MEDICAL HISTORY: Past Medical History:  Diagnosis Date  . Diabetes mellitus without complication ([place])   . Elevated LFTs 05/21/2011  . Hyperlipidemia   . Hypertension     MEDICATIONS AT HOME: Prior to Admission medications   Medication Sig Start Date End Date Taking? Authorizing Provider  aspirin EC 81 MG tablet Take 81 mg by mouth daily.   Yes [provider]  Aspirin-Salicylamide-Caffeine (BC HEADACHE POWDER PO) Take 1 packet by mouth 2 (two) times daily as needed (pain).   Yes [provider]  Blood Glucose Monitoring Suppl (TRUE METRIX METER) W/DEVICE KIT Check Blood Sugar 4 times daily before meals and at bedtime 11/13/14  Yes Jegede, Olugbemiga E, MD  docusate  sodium (COLACE) 100 MG capsule Take 1 capsule (100 mg total) by mouth 2 (two) times daily. 01/04/17  Yes Ladell Pier, MD  ferrous sulfate (FERROUSUL) 325 (65 FE) MG tablet Take 1 tablet (325 mg total) by mouth 2 (two) times daily with a meal. 01/04/17  Yes Ladell Pier, MD  gabapentin (NEURONTIN) 300 MG capsule Take 2 capsules (600 mg total) by mouth 3 (three) times daily. 01/25/19  Yes [name] M, PA-C  glucose blood (TRUE METRIX BLOOD GLUCOSE TEST) test strip Use as instructed 10/15/16  Yes Ladell Pier, MD  insulin aspart (NOVOLOG) 100 UNIT/ML injection Inject 0-12 Units into the skin 3 (three) times daily with meals. As per sliding scale 01/25/19  Yes Argentina Donovan, PA-C  insulin glargine (LANTUS) 100 UNIT/ML injection Inject 0.4 mLs (40 Units total) into the skin 2 (two) times daily. Must keep appt next week for more refills. 01/25/19  Yes Chinedu Agustin M, PA-C  Insulin Syringe-Needle U-100 (B-D INS SYR ULTRAFINE 1CC/31G) 31G X 5/16" 1 ML MISC Use for administration of insulin. 01/25/19  Yes [name] M, PA-C  lisinopril (ZESTRIL) 5 MG tablet Take 0.5 tablets (2.5 mg total) by mouth daily. 01/25/19  Yes Tyre Beaver M, PA-C  oxymetazoline (AFRIN NODRIP SEVERE CONGEST) 0.05 % nasal spray Place 1 spray into both nostrils 2 (two) times daily as needed for congestion.   Yes [provider]  TRUEPLUS LANCETS  26G MISC 1 drop by Does not apply route 3 (three) times daily before meals. 10/15/16  Yes Ladell Pier, MD  cephALEXin (KEFLEX) 500 MG capsule Take 1 capsule (500 mg total) by mouth 4 (four) times daily. 01/25/19   Argentina Donovan, PA-C     Objective:  EXAM:   Vitals:   01/25/19 1052  BP: 96/65  Pulse: (!) 107  Resp: 18  Temp: 98.4 F (36.9 C)  TempSrc: Oral  SpO2: 100%  Weight: 169 lb (76.7 kg)    General appearance : A&OX3. NAD. Non-toxic-appearing HEENT: Atraumatic and Normocephalic.  PERRLA. EOM intact.  Small hordeolum inner canthus  L eye.   Neck: supple, no JVD. No cervical lymphadenopathy. No thyromegaly Chest/Lungs:  Breathing-non-labored, Good air entry bilaterally, breath sounds normal without rales, rhonchi, or wheezing  CVS: S1 S2 regular, no murmurs, gallops, rubs  Extremities: Bilateral Lower Ext shows no edema, both legs are warm to touch with = pulse throughout Neurology:  CN II-XII grossly intact, Non focal.   Psych:  TP linear. J/I WNL. Normal speech. Appropriate eye contact and affect.  Skin:  No Rash  Data Review Lab Results  Component Value Date   HGBA1C 10.1 (A) 01/25/2019   HGBA1C 9.9 (H) 01/04/2017   HGBA1C 10.7 10/15/2016     Assessment & Plan   1. Uncontrolled type 2 diabetes mellitus with hyperglycemia ([place]) He has only been taking meds about 50% of the time, so I will not adjust doses at this time and have him take meds 100% of the time.   - Glucose (CBG) - HgB A1c - insulin glargine (LANTUS) 100 UNIT/ML injection; Inject 0.4 mLs (40 Units total) into the skin 2 (two) times daily. Must keep appt next week for more refills.  Dispense: 10 mL; Refill: 0 - insulin aspart (NOVOLOG) 100 UNIT/ML injection; Inject 0-12 Units into the skin 3 (three) times daily with meals. As per sliding scale  Dispense: 10 mL; Refill: 3 - Insulin Syringe-Needle U-100 (B-D INS SYR ULTRAFINE 1CC/31G) 31G X 5/16" 1 ML MISC; Use for administration of insulin.  Dispense: 100 each; Refill: 12 - Comprehensive metabolic panel - Lipid Panel - CBC with Differential/Platelet; Future  2. Other diabetic neurological complication associated with type 2 diabetes mellitus ([place]) -note not to work on ladders/walk long distances - gabapentin (NEURONTIN) 300 MG capsule; Take 2 capsules (600 mg total) by mouth 3 (three) times daily.  Dispense: 180 capsule; Refill: 3  3. Essential hypertension BP is low so will 1/2 lisinopril to continue kidney protection benefits - lisinopril (ZESTRIL) 5 MG tablet; Take 0.5 tablets (2.5 mg total)  by mouth daily.  Dispense: 30 tablet; Refill: 3  4. Hordeolum externum of left lower eyelid - cephALEXin (KEFLEX) 500 MG capsule; Take 1 capsule (500 mg total) by mouth 4 (four) times daily.  Dispense: 21 capsule; Refill: 0  5. Non-compliance Compliance stressed  Patient have been counseled extensively about nutrition and exercise  Return in about 1 month (around 02/25/2019) for Dr Johnson-chronic medical conditions.  The patient was given clear instructions to go to ER or return to medical center if symptoms don't improve, worsen or new problems develop. The patient verbalized understanding. The patient was told to call to get lab results if they haven't heard anything in the next week.     [name], PA-C [facility] and [facility] [place], [place]   01/25/2019, 11:06 AM

## 2019-01-25 NOTE — Patient Instructions (Signed)
Eliminate sugar from your diet.  Check blood sugar twice daily

## 2019-01-26 LAB — COMPREHENSIVE METABOLIC PANEL
ALT: 10 IU/L (ref 0–44)
AST: 18 IU/L (ref 0–40)
Albumin/Globulin Ratio: 1.6 (ref 1.2–2.2)
Albumin: 4.1 g/dL (ref 3.8–4.9)
Alkaline Phosphatase: 66 IU/L (ref 39–117)
BUN/Creatinine Ratio: 13 (ref 9–20)
BUN: 16 mg/dL (ref 6–24)
Bilirubin Total: <0.2 mg/dL (ref 0.0–1.2)
CO2: 23 mmol/L (ref 20–29)
Calcium: 9.7 mg/dL (ref 8.7–10.2)
Chloride: 99 mmol/L (ref 96–106)
Creatinine, Ser: 1.22 mg/dL (ref 0.76–1.27)
GFR calc Af Amer: 77 mL/min/{1.73_m2} (ref >59)
GFR calc non Af Amer: 66 mL/min/{1.73_m2} (ref >59)
Globulin, Total: 2.5 g/dL (ref 1.5–4.5)
Glucose: 152 mg/dL — ABNORMAL HIGH (ref 65–99)
Potassium: 4.6 mmol/L (ref 3.5–5.2)
Sodium: 138 mmol/L (ref 134–144)
Total Protein: 6.6 g/dL (ref 6.0–8.5)

## 2019-01-26 LAB — LIPID PANEL
Chol/HDL Ratio: 2.7 ratio (ref 0.0–5.0)
Cholesterol, Total: 189 mg/dL (ref 100–199)
HDL: 71 mg/dL (ref >39)
LDL Calculated: 102 mg/dL — ABNORMAL HIGH (ref 0–99)
Triglycerides: 82 mg/dL (ref 0–149)
VLDL Cholesterol Cal: 16 mg/dL (ref 5–40)

## 2019-02-03 ENCOUNTER — Ambulatory Visit: Payer: Self-pay | Admitting: Pharmacist

## 2019-03-07 ENCOUNTER — Encounter: Payer: Self-pay | Admitting: Family Medicine

## 2019-03-07 ENCOUNTER — Ambulatory Visit: Payer: Self-pay | Attending: Family Medicine | Admitting: Family Medicine

## 2019-03-07 ENCOUNTER — Other Ambulatory Visit: Payer: Self-pay

## 2019-03-07 ENCOUNTER — Ambulatory Visit: Payer: Self-pay | Admitting: Internal Medicine

## 2019-03-07 VITALS — BP 92/59 | HR 104 | Temp 98.3°F | Ht 74.0 in | Wt 162.4 lb

## 2019-03-07 DIAGNOSIS — K582 Mixed irritable bowel syndrome: Secondary | ICD-10-CM

## 2019-03-07 DIAGNOSIS — E1165 Type 2 diabetes mellitus with hyperglycemia: Secondary | ICD-10-CM

## 2019-03-07 DIAGNOSIS — F331 Major depressive disorder, recurrent, moderate: Secondary | ICD-10-CM

## 2019-03-07 DIAGNOSIS — E1149 Type 2 diabetes mellitus with other diabetic neurological complication: Secondary | ICD-10-CM

## 2019-03-07 LAB — GLUCOSE, POCT (MANUAL RESULT ENTRY): POC Glucose: 95 mg/dl (ref 70–99)

## 2019-03-07 MED ORDER — TRUE METRIX BLOOD GLUCOSE TEST VI STRP: ORAL_STRIP | 12 refills | Status: DC

## 2019-03-07 MED ORDER — INSULIN GLARGINE 100 UNIT/ML ~~LOC~~ SOLN: 40.0000 [IU] | Freq: Two times a day (BID) | SUBCUTANEOUS | 3 refills | Status: DC

## 2019-03-07 MED ORDER — DULOXETINE HCL 60 MG PO CPEP: 60.0000 mg | ORAL_CAPSULE | Freq: Every day | ORAL | 3 refills | Status: DC

## 2019-03-07 MED ORDER — DICYCLOMINE HCL 10 MG PO CAPS: 10.0000 mg | ORAL_CAPSULE | Freq: Two times a day (BID) | ORAL | 1 refills | Status: DC | PRN

## 2019-03-07 MED ORDER — CYCLOBENZAPRINE HCL 10 MG PO TABS: 10.0000 mg | ORAL_TABLET | Freq: Every day | ORAL | 1 refills | Status: DC

## 2019-03-07 MED ORDER — TRUE METRIX METER W/DEVICE KIT: PACK | 0 refills | Status: DC

## 2019-03-07 MED ORDER — TRUEPLUS LANCETS 26G MISC: 1.0000 [drp] | Freq: Three times a day (TID) | 3 refills | Status: DC

## 2019-03-07 NOTE — Patient Instructions (Addendum)

## 2019-03-07 NOTE — Progress Notes (Signed)
Patient is having pain in feet and legs.  Patient needs new meter.

## 2019-03-07 NOTE — Progress Notes (Signed)
Subjective:  Patient ID: Cody Campbell, male    DOB: 03/19/1966  Age: 53 y.o. MRN: [id number]  CC: Diabetes   HPI Cody Campbell is a 53 year old male with Type 2 Diabetes Mellitus, Diabetic neuropathy, Depression here for a visit with PCP but was informed visit had been rescheduled however he was unaware of this. He complains of severe pain in feet "going up his legs" and has been worsening over the last couple of months. He is on Gabapentin. He also complains of worsening depression ever since he was laid off his job at [employer]. He also has to move out of his home and will have no where to go. Has had suicidal ideations in the past but none now and no suicidal intent or homicidal thoughts. He also complains of alternating diarrhea and constipation with associated abdominal cramps not associated with meals. Denies nausea vomiting  With regards to his Diabetes he has not been checking his blood sugars as his meter is broken. At his visit with the PA last month he had endorses non compliance with his medications but today states he has been compliant  Past Medical History:  Diagnosis Date  . Diabetes mellitus without complication ([place])   . Elevated LFTs 05/21/2011  . Hyperlipidemia   . Hypertension     History reviewed. No pertinent surgical history.  Family History  Problem Relation Age of Onset  . Diabetes Mother   . Hypertension Father   . Diabetes Father   . Heart disease Father     Allergies  Allergen Reactions  . Metformin And Related Diarrhea    Outpatient Medications Prior to Visit  Medication Sig Dispense Refill  . aspirin EC 81 MG tablet Take 81 mg by mouth daily.    . Aspirin-Salicylamide-Caffeine (BC HEADACHE POWDER PO) Take 1 packet by mouth 2 (two) times daily as needed (pain).    Marland Kitchen docusate sodium (COLACE) 100 MG capsule Take 1 capsule (100 mg total) by mouth 2 (two) times daily. 60 capsule 1  . ferrous sulfate (FERROUSUL) 325 (65 FE) MG tablet Take 1  tablet (325 mg total) by mouth 2 (two) times daily with a meal. 60 tablet 3  . gabapentin (NEURONTIN) 300 MG capsule Take 2 capsules (600 mg total) by mouth 3 (three) times daily. 180 capsule 3  . insulin aspart (NOVOLOG) 100 UNIT/ML injection Inject 0-12 Units into the skin 3 (three) times daily with meals. As per sliding scale 10 mL 3  . Insulin Syringe-Needle U-100 (B-D INS SYR ULTRAFINE 1CC/31G) 31G X 5/16" 1 ML MISC Use for administration of insulin. 100 each 12  . lisinopril (ZESTRIL) 5 MG tablet Take 0.5 tablets (2.5 mg total) by mouth daily. 30 tablet 3  . oxymetazoline (AFRIN NODRIP SEVERE CONGEST) 0.05 % nasal spray Place 1 spray into both nostrils 2 (two) times daily as needed for congestion.    . insulin glargine (LANTUS) 100 UNIT/ML injection Inject 0.4 mLs (40 Units total) into the skin 2 (two) times daily. Must keep appt next week for more refills. 10 mL 0  . TRUEPLUS LANCETS 26G MISC 1 drop by Does not apply route 3 (three) times daily before meals. 100 each 3  . cephALEXin (KEFLEX) 500 MG capsule Take 1 capsule (500 mg total) by mouth 4 (four) times daily. (Patient not taking: Reported on 03/07/2019) 21 capsule 0  . Blood Glucose Monitoring Suppl (TRUE METRIX METER) W/DEVICE KIT Check Blood Sugar 4 times daily before meals and  at bedtime (Patient not taking: Reported on 03/07/2019) 1 kit 0  . glucose blood (TRUE METRIX BLOOD GLUCOSE TEST) test strip Use as instructed (Patient not taking: Reported on 03/07/2019) 100 each 12   Facility-Administered Medications Prior to Visit  Medication Dose Route Frequency Provider Last Rate Last Dose  . aspirin chewable tablet 81 mg  81 mg Oral Once Ladell Pier, MD         ROS Review of Systems  Constitutional: Negative for activity change and appetite change.  HENT: Negative for sinus pressure and sore throat.   Eyes: Negative for visual disturbance.  Respiratory: Negative for cough, chest tightness and shortness of breath.    Cardiovascular: Negative for chest pain and leg swelling.  Gastrointestinal:       See hpi  Endocrine: Negative.   Genitourinary: Negative for dysuria.  Musculoskeletal: Negative for joint swelling and myalgias.  Skin: Negative for rash.  Allergic/Immunologic: Negative.   Neurological: Positive for numbness. Negative for weakness and light-headedness.  Psychiatric/Behavioral: Positive for dysphoric mood. Negative for suicidal ideas.    Objective:  BP (!) 92/59   Pulse (!) 104   Temp 98.3 F (36.8 C) (Oral)   Ht '6\' 2"'$  (1.88 m)   Wt 162 lb 6.4 oz (73.7 kg)   SpO2 100%   BMI 20.85 kg/m   BP/Weight 03/07/2019 10/16/15 09/08/4494  Systolic BP 92 96 759  Diastolic BP 59 65 88  Wt. (Lbs) 162.4 169 164.4  BMI 20.85 22.92 22.3      Physical Exam Constitutional:      Appearance: He is well-developed.  Neck:     Vascular: No JVD.  Cardiovascular:     Rate and Rhythm: Tachycardia present.     Heart sounds: Normal heart sounds. No murmur.  Pulmonary:     Effort: Pulmonary effort is normal.     Breath sounds: Normal breath sounds. No wheezing or rales.  Chest:     Chest wall: No tenderness.  Abdominal:     General: Bowel sounds are normal. There is no distension.     Palpations: Abdomen is soft. There is no mass.     Tenderness: There is no abdominal tenderness.  Musculoskeletal: Normal range of motion.     Right lower leg: No edema.     Left lower leg: No edema.  Neurological:     Mental Status: He is alert and oriented to person, place, and time.  Psychiatric:     Comments: Dysphoric mood     CMP Latest Ref Rng & Units 01/25/2019 12/31/2016 11/02/2016  Glucose 65 - 99 mg/dL 152(H) 330(H) 193(H)  BUN 6 - 24 mg/dL 16 13 27(H)  Creatinine 0.76 - 1.27 mg/dL 1.22 1.41(H) 1.77(H)  Sodium 134 - 144 mmol/L 138 136 134(L)  Potassium 3.5 - 5.2 mmol/L 4.6 4.7 4.0  Chloride 96 - 106 mmol/L 99 102 100(L)  CO2 20 - 29 mmol/L '23 25 24  '$ Calcium 8.7 - 10.2 mg/dL 9.7 9.1 9.5  Total  Protein 6.0 - 8.5 g/dL 6.6 - -  Total Bilirubin 0.0 - 1.2 mg/dL <0.2 - -  Alkaline Phos 39 - 117 IU/L 66 - -  AST 0 - 40 IU/L 18 - -  ALT 0 - 44 IU/L 10 - -    Lipid Panel     Component Value Date/Time   CHOL 189 01/25/2019 1122   TRIG 82 01/25/2019 1122   HDL 71 01/25/2019 1122   CHOLHDL 2.7 01/25/2019 1122   CHOLHDL  2.3 05/15/2013 1307   VLDL 11 05/15/2013 1307   LDLCALC 102 (H) 01/25/2019 1122   LDLDIRECT 76 11/14/2012 1604    CBC    Component Value Date/Time   WBC 5.2 01/04/2017 1650   WBC 5.0 12/31/2016 1345   RBC 4.17 01/04/2017 1650   RBC 3.81 (L) 12/31/2016 1345   HGB 11.3 (L) 01/04/2017 1650   HCT 36.1 (L) 01/04/2017 1650   PLT 348 01/04/2017 1650   MCV 87 01/04/2017 1650   MCH 27.1 01/04/2017 1650   MCH 27.0 12/31/2016 1345   MCHC 31.3 (L) 01/04/2017 1650   MCHC 32.6 12/31/2016 1345   RDW 19.1 (H) 01/04/2017 1650   LYMPHSABS 1.6 11/02/2016 1621   MONOABS 0.4 11/02/2016 1621   EOSABS 0.1 11/02/2016 1621   BASOSABS 0.0 11/02/2016 1621    Lab Results  Component Value Date   HGBA1C 10.1 (A) 01/25/2019    Assessment & Plan:   1. Uncontrolled type 2 diabetes mellitus with hyperglycemia (HCC) Uncontrolled due to previous non compliance Regimen was adjusted at his last visit so I will not adjust again today Counseled on Diabetic diet, my plate method, 782 minutes of moderate intensity exercise/week Blood sugar logs with fasting goals of 80-120 mg/dl, random of less than 180 and in the event of sugars less than 60 mg/dl or greater than 400 mg/dl encouraged to notify the clinic. Advised on the need for annual eye exams, annual foot exams, Pneumonia vaccine. - POCT glucose (manual entry) - insulin glargine (LANTUS) 100 UNIT/ML injection; Inject 0.4 mLs (40 Units total) into the skin 2 (two) times daily.  Dispense: 30 mL; Refill: 3 - Blood Glucose Monitoring Suppl (TRUE METRIX METER) w/Device KIT; Check Blood Sugar 3 times daily before meals and at bedtime   Dispense: 1 kit; Refill: 0 - TRUEplus Lancets 26G MISC; 1 drop by Does not apply route 3 (three) times daily before meals.  Dispense: 100 each; Refill: 3 - glucose blood (TRUE METRIX BLOOD GLUCOSE TEST) test strip; Use as instructed  Dispense: 100 each; Refill: 12  2. Irritable bowel syndrome with both constipation and diarrhea Advised he will have to alternate between Bentyl and miralax given combination of his symptoms - dicyclomine (BENTYL) 10 MG capsule; Take 1 capsule (10 mg total) by mouth 2 (two) times daily as needed for spasms. Hold if constipated  Dispense: 60 capsule; Refill: 1  3. Other diabetic neurological complication associated with type 2 diabetes mellitus (HCC) Uncontrolled on Gabapentin Cymbalta added - DULoxetine (CYMBALTA) 60 MG capsule; Take 1 capsule (60 mg total) by mouth daily.  Dispense: 30 capsule; Refill: 3  4. Moderate episode of recurrent major depressive disorder (HCC) Due to underlying Psychosocial stressors Commenced Cymbalta Scheduled with LCSW for therapy, follow up on medication and housing resources - DULoxetine (CYMBALTA) 60 MG capsule; Take 1 capsule (60 mg total) by mouth daily.  Dispense: 30 capsule; Refill: 3    Meds ordered this encounter  Medications  . cyclobenzaprine (FLEXERIL) 10 MG tablet    Sig: Take 1 tablet (10 mg total) by mouth at bedtime.    Dispense:  30 tablet    Refill:  1  . DULoxetine (CYMBALTA) 60 MG capsule    Sig: Take 1 capsule (60 mg total) by mouth daily.    Dispense:  30 capsule    Refill:  3  . dicyclomine (BENTYL) 10 MG capsule    Sig: Take 1 capsule (10 mg total) by mouth 2 (two) times daily as needed for  spasms. Hold if constipated    Dispense:  60 capsule    Refill:  1  . insulin glargine (LANTUS) 100 UNIT/ML injection    Sig: Inject 0.4 mLs (40 Units total) into the skin 2 (two) times daily.    Dispense:  30 mL    Refill:  3  . Blood Glucose Monitoring Suppl (TRUE METRIX METER) w/Device KIT    Sig: Check  Blood Sugar 3 times daily before meals and at bedtime    Dispense:  1 kit    Refill:  0  . TRUEplus Lancets 26G MISC    Sig: 1 drop by Does not apply route 3 (three) times daily before meals.    Dispense:  100 each    Refill:  3  . glucose blood (TRUE METRIX BLOOD GLUCOSE TEST) test strip    Sig: Use as instructed    Dispense:  100 each    Refill:  12    Follow-up: Return in about 3 months (around 06/07/2019) for medical conditions with Dr Wynetta Emery (PCP); [place] med monitoring, housing.       Charlott Rakes, MD, FAAFP. [facility] and [place], [place]   03/07/2019, 4:53 PM

## 2019-03-08 MED FILL — DULoxetine HCL 60 MG CPEP: 60 | 30 days supply | Qty: 30 | Fill #0

## 2019-03-08 MED FILL — DICYCLOMINE 10 MG CAPSULE: 10 | 30 days supply | Qty: 60 | Fill #0

## 2019-03-08 MED FILL — CYCLOBENZAPRINE 10 MG TAB: 10 | 30 days supply | Qty: 30 | Fill #0

## 2019-03-18 ENCOUNTER — Other Ambulatory Visit: Payer: Self-pay

## 2019-03-18 ENCOUNTER — Inpatient Hospital Stay (HOSPITAL_COMMUNITY)
Admission: EM | Admit: 2019-03-18 | Discharge: 2019-03-23 | DRG: 683 | Disposition: A | Discharge status: Other Acute Inpatient Hospital | Payer: Self-pay | Attending: Family Medicine | Admitting: Family Medicine

## 2019-03-18 DIAGNOSIS — N179 Acute kidney failure, unspecified: Principal | ICD-10-CM | POA: Diagnosis present

## 2019-03-18 DIAGNOSIS — F17290 Nicotine dependence, other tobacco product, uncomplicated: Secondary | ICD-10-CM | POA: Diagnosis present

## 2019-03-18 DIAGNOSIS — I951 Orthostatic hypotension: Secondary | ICD-10-CM | POA: Diagnosis present

## 2019-03-18 DIAGNOSIS — Z794 Long term (current) use of insulin: Secondary | ICD-10-CM

## 2019-03-18 DIAGNOSIS — R45851 Suicidal ideations: Secondary | ICD-10-CM | POA: Diagnosis present

## 2019-03-18 DIAGNOSIS — F319 Bipolar disorder, unspecified: Secondary | ICD-10-CM | POA: Diagnosis present

## 2019-03-18 DIAGNOSIS — E860 Dehydration: Secondary | ICD-10-CM | POA: Diagnosis present

## 2019-03-18 DIAGNOSIS — R42 Dizziness and giddiness: Secondary | ICD-10-CM

## 2019-03-18 DIAGNOSIS — F32A Depression, unspecified: Secondary | ICD-10-CM | POA: Diagnosis present

## 2019-03-18 DIAGNOSIS — E1165 Type 2 diabetes mellitus with hyperglycemia: Secondary | ICD-10-CM | POA: Diagnosis present

## 2019-03-18 DIAGNOSIS — Z6821 Body mass index (BMI) 21.0-21.9, adult: Secondary | ICD-10-CM

## 2019-03-18 DIAGNOSIS — E46 Unspecified protein-calorie malnutrition: Secondary | ICD-10-CM | POA: Diagnosis present

## 2019-03-18 DIAGNOSIS — I10 Essential (primary) hypertension: Secondary | ICD-10-CM | POA: Diagnosis present

## 2019-03-18 DIAGNOSIS — Z590 Homelessness: Secondary | ICD-10-CM

## 2019-03-18 DIAGNOSIS — IMO0002 Reserved for concepts with insufficient information to code with codable children: Secondary | ICD-10-CM | POA: Diagnosis present

## 2019-03-18 DIAGNOSIS — F329 Major depressive disorder, single episode, unspecified: Secondary | ICD-10-CM | POA: Diagnosis present

## 2019-03-18 DIAGNOSIS — Z20828 Contact with and (suspected) exposure to other viral communicable diseases: Secondary | ICD-10-CM | POA: Diagnosis present

## 2019-03-18 DIAGNOSIS — Z833 Family history of diabetes mellitus: Secondary | ICD-10-CM

## 2019-03-18 DIAGNOSIS — Z79899 Other long term (current) drug therapy: Secondary | ICD-10-CM

## 2019-03-18 DIAGNOSIS — Z560 Unemployment, unspecified: Secondary | ICD-10-CM

## 2019-03-18 DIAGNOSIS — E11649 Type 2 diabetes mellitus with hypoglycemia without coma: Secondary | ICD-10-CM | POA: Diagnosis present

## 2019-03-18 DIAGNOSIS — Z8249 Family history of ischemic heart disease and other diseases of the circulatory system: Secondary | ICD-10-CM

## 2019-03-18 DIAGNOSIS — F331 Major depressive disorder, recurrent, moderate: Secondary | ICD-10-CM

## 2019-03-18 DIAGNOSIS — E1143 Type 2 diabetes mellitus with diabetic autonomic (poly)neuropathy: Secondary | ICD-10-CM | POA: Diagnosis present

## 2019-03-18 DIAGNOSIS — E785 Hyperlipidemia, unspecified: Secondary | ICD-10-CM | POA: Diagnosis present

## 2019-03-18 LAB — CBC WITH DIFFERENTIAL/PLATELET
Abs Immature Granulocytes: 0.01 10*3/uL (ref 0.00–0.07)
Basophils Absolute: 0.1 10*3/uL (ref 0.0–0.1)
Basophils Relative: 1 %
Eosinophils Absolute: 0.3 10*3/uL (ref 0.0–0.5)
Eosinophils Relative: 4 %
HCT: 31.6 % — ABNORMAL LOW (ref 39.0–52.0)
Hemoglobin: 10 g/dL — ABNORMAL LOW (ref 13.0–17.0)
Immature Granulocytes: 0 %
Lymphocytes Relative: 18 %
Lymphs Abs: 1.2 10*3/uL (ref 0.7–4.0)
MCH: 26.4 pg (ref 26.0–34.0)
MCHC: 31.6 g/dL (ref 30.0–36.0)
MCV: 83.4 fL (ref 80.0–100.0)
Monocytes Absolute: 0.7 10*3/uL (ref 0.1–1.0)
Monocytes Relative: 10 %
Neutro Abs: 4.7 10*3/uL (ref 1.7–7.7)
Neutrophils Relative %: 67 %
Platelets: 408 10*3/uL — ABNORMAL HIGH (ref 150–400)
RBC: 3.79 MIL/uL — ABNORMAL LOW (ref 4.22–5.81)
RDW: 16.6 % — ABNORMAL HIGH (ref 11.5–15.5)
WBC: 6.9 10*3/uL (ref 4.0–10.5)
nRBC: 0 % (ref 0.0–0.2)

## 2019-03-18 LAB — BASIC METABOLIC PANEL WITH GFR
Anion gap: 14 (ref 5–15)
BUN: 34 mg/dL — ABNORMAL HIGH (ref 6–20)
CO2: 21 mmol/L — ABNORMAL LOW (ref 22–32)
Calcium: 9.5 mg/dL (ref 8.9–10.3)
Chloride: 97 mmol/L — ABNORMAL LOW (ref 98–111)
Creatinine, Ser: 1.84 mg/dL — ABNORMAL HIGH (ref 0.61–1.24)
GFR calc Af Amer: 47 mL/min — ABNORMAL LOW
GFR calc non Af Amer: 40 mL/min — ABNORMAL LOW
Glucose, Bld: 83 mg/dL (ref 70–99)
Potassium: 3.9 mmol/L (ref 3.5–5.1)
Sodium: 132 mmol/L — ABNORMAL LOW (ref 135–145)

## 2019-03-18 LAB — BASIC METABOLIC PANEL
Anion gap: 12 (ref 5–15)
BUN: 30 mg/dL — ABNORMAL HIGH (ref 6–20)
CO2: 20 mmol/L — ABNORMAL LOW (ref 22–32)
Calcium: 8.6 mg/dL — ABNORMAL LOW (ref 8.9–10.3)
Chloride: 98 mmol/L (ref 98–111)
Creatinine, Ser: 1.65 mg/dL — ABNORMAL HIGH (ref 0.61–1.24)
GFR calc Af Amer: 53 mL/min — ABNORMAL LOW (ref >60)
GFR calc non Af Amer: 46 mL/min — ABNORMAL LOW (ref >60)
Glucose, Bld: 378 mg/dL — ABNORMAL HIGH (ref 70–99)
Potassium: 4.4 mmol/L (ref 3.5–5.1)
Sodium: 130 mmol/L — ABNORMAL LOW (ref 135–145)

## 2019-03-18 LAB — TROPONIN I (HIGH SENSITIVITY)
Troponin I (High Sensitivity): 49 ng/L — ABNORMAL HIGH (ref <18)
Troponin I (High Sensitivity): 44 ng/L — ABNORMAL HIGH (ref <18)

## 2019-03-18 LAB — CBG MONITORING, ED
Glucose-Capillary: 378 mg/dL — ABNORMAL HIGH (ref 70–99)
Glucose-Capillary: 454 mg/dL — ABNORMAL HIGH (ref 70–99)

## 2019-03-18 LAB — MAGNESIUM: Magnesium: 2.2 mg/dL (ref 1.7–2.4)

## 2019-03-18 MED ORDER — SODIUM CHLORIDE 0.9 % IV SOLN
INTRAVENOUS | Status: DC
  Administered 2019-03-19 – 2019-03-20 (×4): via INTRAVENOUS

## 2019-03-18 MED ORDER — SODIUM CHLORIDE 0.9 % IV BOLUS
1000.0000 mL | Freq: Once | INTRAVENOUS | Status: AC
  Administered 2019-03-18: 1000 mL via INTRAVENOUS

## 2019-03-18 MED ORDER — OXYMETAZOLINE HCL 0.05 % NA SOLN
1.0000 | Freq: Two times a day (BID) | NASAL | Status: DC | PRN
  Filled 2019-03-18 (×2): qty 30

## 2019-03-18 MED ORDER — ONDANSETRON HCL 4 MG PO TABS: 4.0000 mg | ORAL_TABLET | Freq: Four times a day (QID) | ORAL | Status: DC | PRN

## 2019-03-18 MED ORDER — DULOXETINE HCL 60 MG PO CPEP: 60.0000 mg | ORAL_CAPSULE | Freq: Every day | ORAL | Status: DC

## 2019-03-18 MED ORDER — ENOXAPARIN SODIUM 40 MG/0.4ML ~~LOC~~ SOLN
40.0000 mg | SUBCUTANEOUS | Status: DC
  Administered 2019-03-19 – 2019-03-21 (×3): 40 mg via SUBCUTANEOUS
  Filled 2019-03-18 (×5): qty 0.4

## 2019-03-18 MED ORDER — INSULIN ASPART 100 UNIT/ML ~~LOC~~ SOLN
4.0000 [IU] | Freq: Three times a day (TID) | SUBCUTANEOUS | Status: DC
  Administered 2019-03-19: 4 [IU] via SUBCUTANEOUS

## 2019-03-18 MED ORDER — INSULIN GLARGINE 100 UNIT/ML ~~LOC~~ SOLN
35.0000 [IU] | Freq: Every day | SUBCUTANEOUS | Status: DC
  Administered 2019-03-19: 35 [IU] via SUBCUTANEOUS
  Filled 2019-03-18 (×3): qty 0.35

## 2019-03-18 MED ORDER — INSULIN ASPART 100 UNIT/ML IV SOLN
10.0000 [IU] | Freq: Once | INTRAVENOUS | Status: AC
  Administered 2019-03-18: 10 [IU] via INTRAVENOUS

## 2019-03-18 MED ORDER — ACETAMINOPHEN 325 MG PO TABS
650.0000 mg | ORAL_TABLET | Freq: Four times a day (QID) | ORAL | Status: DC | PRN
  Administered 2019-03-19: 650 mg via ORAL
  Filled 2019-03-18 (×2): qty 2

## 2019-03-18 MED ORDER — ACETAMINOPHEN 650 MG RE SUPP: 650.0000 mg | Freq: Four times a day (QID) | RECTAL | Status: DC | PRN

## 2019-03-18 MED ORDER — ONDANSETRON HCL 4 MG/2ML IJ SOLN: 4.0000 mg | Freq: Four times a day (QID) | INTRAMUSCULAR | Status: DC | PRN

## 2019-03-18 MED ORDER — FERROUS SULFATE 325 (65 FE) MG PO TABS
325.0000 mg | ORAL_TABLET | Freq: Two times a day (BID) | ORAL | Status: DC
  Administered 2019-03-19 – 2019-03-23 (×10): 325 mg via ORAL
  Filled 2019-03-18 (×10): qty 1

## 2019-03-18 MED ORDER — SODIUM CHLORIDE 0.9 % IV SOLN
INTRAVENOUS | Status: DC
  Administered 2019-03-18 – 2019-03-19 (×3): via INTRAVENOUS

## 2019-03-18 MED ORDER — INSULIN ASPART 100 UNIT/ML ~~LOC~~ SOLN
0.0000 [IU] | Freq: Three times a day (TID) | SUBCUTANEOUS | Status: DC
  Administered 2019-03-19: 2 [IU] via SUBCUTANEOUS
  Administered 2019-03-19 – 2019-03-20 (×2): 3 [IU] via SUBCUTANEOUS
  Administered 2019-03-20: 15 [IU] via SUBCUTANEOUS
  Administered 2019-03-20: 3 [IU] via SUBCUTANEOUS
  Administered 2019-03-21: 8 [IU] via SUBCUTANEOUS
  Administered 2019-03-21: 2 [IU] via SUBCUTANEOUS
  Administered 2019-03-21: 8 [IU] via SUBCUTANEOUS
  Administered 2019-03-22: 5 [IU] via SUBCUTANEOUS
  Administered 2019-03-22: 11 [IU] via SUBCUTANEOUS
  Administered 2019-03-23: 3 [IU] via SUBCUTANEOUS

## 2019-03-18 MED ORDER — GABAPENTIN 300 MG PO CAPS
600.0000 mg | ORAL_CAPSULE | Freq: Three times a day (TID) | ORAL | Status: DC
  Administered 2019-03-18 – 2019-03-23 (×15): 600 mg via ORAL
  Filled 2019-03-18 (×15): qty 2

## 2019-03-18 MED ORDER — INSULIN GLARGINE 100 UNIT/ML ~~LOC~~ SOLN: 35.0000 [IU] | Freq: Every day | SUBCUTANEOUS | Status: DC

## 2019-03-18 NOTE — ED Notes (Signed)
ED TO INPATIENT HANDOFF REPORT  ED Nurse Name and Phone #: [phone] Shawna OrleansMelanie, RN  S Name/Age/Gender Cody Campbell 53 y.o. male Room/Bed: H020C/H020C  Code Status   Code Status: Full Code  Home/SNF/Other Home Patient oriented to: self, place, time and situation Is this baseline? Yes   Triage Complete: Triage complete  Chief Complaint disoriented, dizzness,falling diabetic  Triage Note Pt states dizziness started 2-3 days ago, feels he can't walk or stand, has no appetite for fear of nausea, tires easily  Pt states symptoms possibly related to blood pressure   Allergies Allergies  Allergen Reactions  . Metformin And Related Diarrhea    Level of Care/Admitting Diagnosis ED Disposition    ED Disposition Condition Comment   Admit  [facility] [[id number]]  Level of Care: Med-Surg [16]  I expect the patient will be discharged within 24 hours: Yes  LOW acuity---Tx typically complete <24 hrs---ACUTE conditions typically can be evaluated <24 hours---LABS likely to return to acceptable levels <24 hours---IS near functional baseline---EXPECTED to return to current living arrangement---NOT newly hypoxic: Meets criteria for 5C-Observation unit  Covid Evaluation: Asymptomatic Screening Protocol (No Symptoms)  Diagnosis: Orthostatic hypotension [458.0.ICD-9-CM]  Admitting Physician: Hillary BowGARDNER, JARED M [[id number]]  Attending Physician: Hillary BowGARDNER, JARED M [[id number]]  PT Class (Do Not Modify): Observation [104]  PT Acc Code (Do Not Modify): Observation [10022]       B Medical/Surgery History Past Medical History:  Diagnosis Date  . Diabetes mellitus without complication (HCC)   . Elevated LFTs 05/21/2011  . Hyperlipidemia   . Hypertension    No past surgical history on file.   A IV Location/Drains/Wounds Patient Lines/Drains/Airways Status   Active Line/Drains/Airways    Name:   Placement date:   Placement time:   Site:   Days:   Peripheral IV 03/18/19  Right Antecubital   03/18/19    1345    Antecubital   less than 1          Intake/Output Last 24 hours  Intake/Output Summary (Last 24 hours) at 03/18/2019 2246 Last data filed at 03/18/2019 1810 Gross per 24 hour  Intake 999 ml  Output -  Net 999 ml    Labs/Imaging Results for orders placed or performed during the hospital encounter of 03/18/19 (from the past 48 hour(s))  Basic metabolic panel     Status: Abnormal   Collection Time: 03/18/19  2:03 PM  Result Value Ref Range   Sodium 132 (L) 135 - 145 mmol/L   Potassium 3.9 3.5 - 5.1 mmol/L   Chloride 97 (L) 98 - 111 mmol/L   CO2 21 (L) 22 - 32 mmol/L   Glucose, Bld 83 70 - 99 mg/dL   BUN 34 (H) 6 - 20 mg/dL   Creatinine, Ser 2.531.84 (H) 0.61 - 1.24 mg/dL   Calcium 9.5 8.9 - 66.410.3 mg/dL   GFR calc non Af Amer 40 (L) >60 mL/min   GFR calc Af Amer 47 (L) >60 mL/min   Anion gap 14 5 - 15    Comment: Performed at [facility], [address] [place], [place] [id number]  CBC with Differential     Status: Abnormal   Collection Time: 03/18/19  2:03 PM  Result Value Ref Range   WBC 6.9 4.0 - 10.5 K/uL   RBC 3.79 (L) 4.22 - 5.81 MIL/uL   Hemoglobin 10.0 (L) 13.0 - 17.0 g/dL   HCT 42.531.6 (L) 95.639.0 - 38.752.0 %   MCV 83.4  80.0 - 100.0 fL   MCH 26.4 26.0 - 34.0 pg   MCHC 31.6 30.0 - 36.0 g/dL   RDW 94.7 (H) 09.6 - 28.3 %   Platelets 408 (H) 150 - 400 K/uL   nRBC 0.0 0.0 - 0.2 %   Neutrophils Relative % 67 %   Neutro Abs 4.7 1.7 - 7.7 K/uL   Lymphocytes Relative 18 %   Lymphs Abs 1.2 0.7 - 4.0 K/uL   Monocytes Relative 10 %   Monocytes Absolute 0.7 0.1 - 1.0 K/uL   Eosinophils Relative 4 %   Eosinophils Absolute 0.3 0.0 - 0.5 K/uL   Basophils Relative 1 %   Basophils Absolute 0.1 0.0 - 0.1 K/uL   Immature Granulocytes 0 %   Abs Immature Granulocytes 0.01 0.00 - 0.07 K/uL    Comment: Performed at [facility], [address] [place], [place] [address]  Troponin I (High Sensitivity)     Status: Abnormal   Collection  Time: 03/18/19  2:03 PM  Result Value Ref Range   Troponin I (High Sensitivity) 49 (H) <18 ng/L    Comment: (NOTE) Elevated high sensitivity troponin I (hsTnI) values and significant  changes across serial measurements may suggest ACS but many other  chronic and acute conditions are known to elevate hsTnI results.  Refer to the "Links" section for chest pain algorithms and additional  guidance. Performed at [facility], [address] [place], [place] [address]   Magnesium     Status: None   Collection Time: 03/18/19  2:03 PM  Result Value Ref Range   Magnesium 2.2 1.7 - 2.4 mg/dL    Comment: Performed at [facility], [address] [place], [place] [address]  Troponin I (High Sensitivity)     Status: Abnormal   Collection Time: 03/18/19  4:07 PM  Result Value Ref Range   Troponin I (High Sensitivity) 44 (H) <18 ng/L    Comment: (NOTE) Elevated high sensitivity troponin I (hsTnI) values and significant  changes across serial measurements may suggest ACS but many other  chronic and acute conditions are known to elevate hsTnI results.  Refer to the "Links" section for chest pain algorithms and additional  guidance. Performed at [facility], [address], [place], [place] [address]   Basic metabolic panel     Status: Abnormal   Collection Time: 03/18/19  6:15 PM  Result Value Ref Range   Sodium 130 (L) 135 - 145 mmol/L   Potassium 4.4 3.5 - 5.1 mmol/L   Chloride 98 98 - 111 mmol/L   CO2 20 (L) 22 - 32 mmol/L   Glucose, Bld 378 (H) 70 - 99 mg/dL   BUN 30 (H) 6 - 20 mg/dL   Creatinine, Ser 2.75 (H) 0.61 - 1.24 mg/dL   Calcium 8.6 (L) 8.9 - 10.3 mg/dL   GFR calc non Af Amer 46 (L) >60 mL/min   GFR calc Af Amer 53 (L) >60 mL/min   Anion gap 12 5 - 15    Comment: Performed at [facility], [address], [place], [place] [address]  CBG monitoring, ED     Status: Abnormal   Collection Time: 03/18/19  8:51 PM  Result Value Ref Range    Glucose-Capillary 378 (H) 70 - 99 mg/dL   No results found.  Pending Labs [employer] (From admission, onward)    Start     Ordered   03/19/19 0500  Basic  metabolic panel  Tomorrow morning,   R     03/18/19 2225   03/18/19 2225  HIV Antibody (routine testing w rflx)  (HIV Antibody (Routine testing w reflex) panel)  Once,   STAT     03/18/19 2225   03/18/19 2225  HIV4GL Save Tube  (HIV Antibody (Routine testing w reflex) panel)  Once,   STAT     03/18/19 2225          Vitals/Pain Today's Vitals   03/18/19 1809 03/18/19 1815 03/18/19 1830 03/18/19 1845  BP: 125/78 131/67 (!) 168/80 (!) 161/78  Pulse: 99 93 91 88  Resp: 13 16 12 12   Temp:      TempSrc:      SpO2: 100% 100% 99% 100%  Weight:      Height:      PainSc:        Isolation Precautions No active isolations  Medications Medications  gabapentin (NEURONTIN) capsule 600 mg (has no administration in time range)  oxymetazoline (AFRIN) 0.05 % nasal spray 1 spray (has no administration in time range)  ferrous sulfate tablet 325 mg (has no administration in time range)  insulin aspart (novoLOG) injection 0-15 Units (has no administration in time range)  insulin aspart (novoLOG) injection 4 Units (has no administration in time range)  0.9 %  sodium chloride infusion (has no administration in time range)  acetaminophen (TYLENOL) tablet 650 mg (has no administration in time range)    Or  acetaminophen (TYLENOL) suppository 650 mg (has no administration in time range)  ondansetron (ZOFRAN) tablet 4 mg (has no administration in time range)    Or  ondansetron (ZOFRAN) injection 4 mg (has no administration in time range)  enoxaparin (LOVENOX) injection 40 mg (has no administration in time range)  0.9 %  sodium chloride infusion (has no administration in time range)  insulin glargine (LANTUS) injection 35 Units (has no administration in time range)  sodium chloride 0.9 % bolus 1,000 mL (0 mLs Intravenous Stopped 03/18/19  1607)  sodium chloride 0.9 % bolus 1,000 mL (0 mLs Intravenous Stopped 03/18/19 1810)  sodium chloride 0.9 % bolus 1,000 mL (0 mLs Intravenous Stopped 03/18/19 2151)    Mobility walks with device High fall risk   Focused Assessments Cardiac Assessment Handoff:    Lab Results  Component Value Date   CKTOTAL 137 10/31/2010   CKMB 0.6 10/31/2007   TROPONINI 0.03 (HH) 12/31/2016   No results found for: DDIMER Does the Patient currently have chest pain? No     R Recommendations: See Admitting Provider Note  Report given to:   Additional Notes:

## 2019-03-18 NOTE — ED Triage Notes (Signed)
Pt states symptoms possibly related to blood pressure

## 2019-03-18 NOTE — ED Notes (Signed)
Pt became very dizzy and had to sit down before completing orthostatic vs

## 2019-03-18 NOTE — Discharge Instructions (Signed)
Your work-up today showed elevated kidney function.  Prior but was improving after fluids.  We suspect you are dehydrated leading to your symptoms.  Please rest and stay hydrated.  Please do not stand up too quickly as this likely contributed to the lightheadedness episode.  Please follow-up with your primary doctor.  If any symptoms change or worsen, please return to nearest emergency department.

## 2019-03-18 NOTE — ED Notes (Addendum)
Pt ambulated by self to RR8, stating he "had the runs." This nurse helped pt ambulate back to bed, assessed dizziness ("I still feel dizzy, I'm glad you helped me" per pt), and reinforced necessity of call light utilization. Vitals taken, BP 125/78, HR 99
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
## 2019-03-18 NOTE — ED Notes (Addendum)
Pt told this nurse that he tested his blood sugar and it was 70, requested "real food." This nurse got the pt a [place] sandwich.

## 2019-03-18 NOTE — ED Triage Notes (Signed)
Pt states dizziness started 2-3 days ago, feels he can't walk or stand, has no appetite for fear of nausea, tires easily
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
## 2019-03-18 NOTE — ED Provider Notes (Addendum)
4:11 PM Care assumed from Dr. Langston Masker. At time of transfer of care, patient is awaiting rehydration with a second liter fluids as well as repeat BMP to look for improving kidney function.  If orthostatics have improved and kidney function is improving, anticipate discharge home after p.o. challenge.  7:41 PM Patient finished fluids and was feeling slightly better.  Creatinine was improving.  Delta troponin similar to prior.  Suspect dehydration leading to kidney injury that is improving with fluids.  As patient was able to eat and drink, we feel he is safe for discharge home according to previous team.  Patient was able to ambulate and did not syncopized or get very lightheaded.  We feel he is safe for discharge.  Patient is homeless and is requesting resources and case management.  We will try and arrange this with discharge.  8:06 PM While preparing to discharge patient, patient had orthostatics checked 1 more time and blood pressure dropped significantly into the 80s with standing.  Patient given 1 more liter of fluid.  If his orthostatics do not improve with his standing and he gets hypotensive again, he will likely admission for rehydration given the AKI.  9:55 PM Was just made aware the patient's repeat orthostatics were again positive and he was very lightheaded when he tried to stand.  Given the kidney injury and his continued orthostatic hypotension with near syncope after repeat hydration, patient will be called for admission.  Clinical Impression: 1. Dehydration   2. Orthostatic hypotension   3. Lightheadedness     Disposition: Admit  This note was prepared with assistance of Systems analyst. Occasional wrong-word or sound-a-like substitutions may have occurred due to the inherent limitations of voice recognition software.     Tegeler, Gwenyth Allegra, MD 03/18/19 [phone]

## 2019-03-18 NOTE — ED Notes (Signed)
Dr. Alcario Drought paged, pt's CBG [phone]

## 2019-03-19 LAB — GLUCOSE, CAPILLARY
Glucose-Capillary: 144 mg/dL — ABNORMAL HIGH (ref 70–99)
Glucose-Capillary: 39 mg/dL — CL (ref 70–99)
Glucose-Capillary: 45 mg/dL — ABNORMAL LOW (ref 70–99)
Glucose-Capillary: 69 mg/dL — ABNORMAL LOW (ref 70–99)
Glucose-Capillary: 80 mg/dL (ref 70–99)
Glucose-Capillary: 50 mg/dL — ABNORMAL LOW (ref 70–99)
Glucose-Capillary: 140 mg/dL — ABNORMAL HIGH (ref 70–99)
Glucose-Capillary: 150 mg/dL — ABNORMAL HIGH (ref 70–99)
Glucose-Capillary: 172 mg/dL — ABNORMAL HIGH (ref 70–99)
Glucose-Capillary: 187 mg/dL — ABNORMAL HIGH (ref 70–99)
Glucose-Capillary: 102 mg/dL — ABNORMAL HIGH (ref 70–99)

## 2019-03-19 LAB — BASIC METABOLIC PANEL
Anion gap: 11 (ref 5–15)
BUN: 23 mg/dL — ABNORMAL HIGH (ref 6–20)
CO2: 20 mmol/L — ABNORMAL LOW (ref 22–32)
Calcium: 8.3 mg/dL — ABNORMAL LOW (ref 8.9–10.3)
Chloride: 100 mmol/L (ref 98–111)
Creatinine, Ser: 1.42 mg/dL — ABNORMAL HIGH (ref 0.61–1.24)
GFR calc Af Amer: >60 mL/min (ref >60)
GFR calc non Af Amer: 55 mL/min — ABNORMAL LOW (ref >60)
Glucose, Bld: 531 mg/dL (ref 70–99)
Potassium: 4.9 mmol/L (ref 3.5–5.1)
Sodium: 131 mmol/L — ABNORMAL LOW (ref 135–145)

## 2019-03-19 LAB — HIV ANTIBODY (ROUTINE TESTING W REFLEX): HIV Screen 4th Generation wRfx: NONREACTIVE

## 2019-03-19 LAB — SARS CORONAVIRUS 2 (TAT 6-24 HRS): SARS Coronavirus 2: NEGATIVE

## 2019-03-19 MED ORDER — INSULIN ASPART 100 UNIT/ML ~~LOC~~ SOLN
10.0000 [IU] | Freq: Once | SUBCUTANEOUS | Status: AC
  Administered 2019-03-19: 10 [IU] via SUBCUTANEOUS

## 2019-03-19 MED ORDER — DEXTROSE 50 % IV SOLN
INTRAVENOUS | Status: AC
  Administered 2019-03-19: 50 mL
  Filled 2019-03-19: qty 50

## 2019-03-19 MED ORDER — HYDRALAZINE HCL 25 MG PO TABS: 25.0000 mg | ORAL_TABLET | Freq: Four times a day (QID) | ORAL | Status: DC | PRN

## 2019-03-19 NOTE — Progress Notes (Signed)
I paged Dr. Marthenia Rolling for the update of CBG.

## 2019-03-19 NOTE — Progress Notes (Addendum)
CRITICAL VALUE ALERT  Critical Value:  Glucose=531  Date & Time Notied:  03/19/19 0525  Provider Notified: Lamar Blinks, NP  Orders Received/Actions taken: 10 units of novolog subq x1

## 2019-03-19 NOTE — Progress Notes (Signed)
Pt cbg this 0850 was 144, while he was eating his breakfast at 1006 has s/s of hypoglycemia and we checked the cbg 39 pt alert and oriented and given orange juice, paged MD, rechecked again the cbg was 45 at 1014, at 1027 right hand cbg 69 and left hand check 80, will continue to monitor s/s of hypo/hyperglycemia.

## 2019-03-19 NOTE — Progress Notes (Signed)
PROGRESS NOTE    Cody Campbell  XBM:[id number] DOB: 01-11-1966 DOA: 03/18/2019 PCP: Marcine Matar, MD  Outpatient Specialists:     Brief Narrative: Patient is a 53 year old African-American male, recently homeless, with past medical history significant for, hyperlipidemia, abnormal LFTs and diabetes mellitus.  Patient was admitted with orthostatic hypotension acute kidney injury and volume depletion.  Hypoglycemia has been noted during the hospital stay.  Patient is currently being hydrated, and her blood sugar has been optimized.  Further management will depend on hospital course.   Assessment & Plan:   Principal Problem:   Orthostatic hypotension Active Problems:   DM (diabetes mellitus), type 2, uncontrolled ([facility])   Hypertension   Depression   AKI (acute kidney injury) ([facility])  Orthostatic hypotension: -This is likely secondary to volume depletion. -Continue IV fluids. -Continue to monitor orthostatic blood pressure every 8 hourly. -Lisinopril is on hold.  Acute kidney injury: Suspect secondary to volume depletion. Serum creatinine was 1.84 on presentation.  Baseline serum creatinine is 1.22. Serum creatinine is gradually improving.  Serum creatinine was 1.65 earlier today. -Continue to monitor renal function and electrolytes closely.  Diabetes mellitus, uncontrolled: Hypoglycemia noted overnight. Will hold subcutaneous Lantus 35 units nightly We will hold subcutaneous NovoLog 4 units with meals. Continue blood sugar monitoring for now. Check HbA1c. Further management will depend on hospital course.  Hypertension: Lisinopril is currently on hold. Continue to monitor blood pressure closely. Will start as needed hydralazine.  Homelessness: SW consult.  DVT prophylaxis: Lovenox Code Status: Full code Family Communication:  Disposition Plan: This will depend on hospital course   Consultants:   None  Procedures:   None  Antimicrobials:   None    Subjective: No new complaints  Objective: Vitals:   03/18/19 2258 03/18/19 2327 03/19/19 0447 03/19/19 0450  BP: (!) 186/84 103/81 (!) 169/80 (!) 169/82  Pulse: 85 (!) 105 84 82  Resp: 18 18 16 16   Temp:  97.9 F (36.6 C) 98.9 F (37.2 C) 98.7 F (37.1 C)  TempSrc:  Oral Oral Oral  SpO2: 100% 100% 100% 100%  Weight:      Height:        Intake/Output Summary (Last 24 hours) at 03/19/2019 1342 Last data filed at 03/19/2019 0630 Gross per 24 hour  Intake 2106.87 ml  Output 1500 ml  Net 606.87 ml   Filed Weights   03/18/19 1309  Weight: 74.8 kg    Examination:  General exam: Appears calm and comfortable.  Dry buccal mucosa. Respiratory system: Clear to auscultation.  Cardiovascular system: S1 & S2 heard. Gastrointestinal system: Abdomen is nondistended, soft and nontender. No organomegaly or masses felt. Normal bowel sounds heard. Central nervous system: Awake and alert.  Patient moves all extremities.   Extremities: No leg edema.  Data Reviewed: I have personally reviewed following labs and imaging studies  CBC: Recent Labs  Lab 03/18/19 1403  WBC 6.9  NEUTROABS 4.7  HGB 10.0*  HCT 31.6*  MCV 83.4  PLT 408*   Basic Metabolic Panel: Recent Labs  Lab 03/18/19 1403 03/18/19 1815 03/19/19 0317  NA 132* 130* 131*  K 3.9 4.4 4.9  CL 97* 98 100  CO2 21* 20* 20*  GLUCOSE 83 378* 531*  BUN 34* 30* 23*  CREATININE 1.84* 1.65* 1.42*  CALCIUM 9.5 8.6* 8.3*  MG 2.2  --   --    GFR: Estimated Creatinine Clearance: 62.2 mL/min (A) (by C-G formula based on SCr of 1.42 mg/dL (H)). Liver Function  Tests: No results for input(s): AST, ALT, ALKPHOS, BILITOT, PROT, ALBUMIN in the last 168 hours. No results for input(s): LIPASE, AMYLASE in the last 168 hours. No results for input(s): AMMONIA in the last 168 hours. Coagulation Profile: No results for input(s): INR, PROTIME in the last 168 hours. Cardiac Enzymes: No results for input(s): CKTOTAL, CKMB, CKMBINDEX,  TROPONINI in the last 168 hours. BNP (last 3 results) No results for input(s): PROBNP in the last 8760 hours. HbA1C: No results for input(s): HGBA1C in the last 72 hours. CBG: Recent Labs  Lab 03/19/19 1014 03/19/19 1027 03/19/19 1029 03/19/19 1219 03/19/19 1317  GLUCAP 45* 69* 80 50* 140*   Lipid Profile: No results for input(s): CHOL, HDL, LDLCALC, TRIG, CHOLHDL, LDLDIRECT in the last 72 hours. Thyroid Function Tests: No results for input(s): TSH, T4TOTAL, FREET4, T3FREE, THYROIDAB in the last 72 hours. Anemia Panel: No results for input(s): VITAMINB12, FOLATE, FERRITIN, TIBC, IRON, RETICCTPCT in the last 72 hours. Urine analysis:    Component Value Date/Time   COLORURINE YELLOW 12/31/2016 1505   APPEARANCEUR HAZY (A) 12/31/2016 1505   LABSPEC 1.014 12/31/2016 1505   PHURINE 6.0 12/31/2016 1505   GLUCOSEU >=500 (A) 12/31/2016 1505   HGBUR SMALL (A) 12/31/2016 1505   BILIRUBINUR NEGATIVE 12/31/2016 1505   BILIRUBINUR Negative 12/13/2014 1510   KETONESUR NEGATIVE 12/31/2016 1505   PROTEINUR 100 (A) 12/31/2016 1505   UROBILINOGEN 0.2 12/13/2014 1510   UROBILINOGEN 1.0 10/31/2007 1621   NITRITE NEGATIVE 12/31/2016 1505   LEUKOCYTESUR NEGATIVE 12/31/2016 1505   Sepsis Labs: @LABRCNTIP (procalcitonin:4,lacticidven:4)  ) Recent Results (from the past 240 hour(s))  SARS CORONAVIRUS 2 (TAT 6-24 HRS) Nasopharyngeal Nasopharyngeal Swab     Status: None   Collection Time: 03/18/19 11:07 PM   Specimen: Nasopharyngeal Swab  Result Value Ref Range Status   SARS Coronavirus 2 NEGATIVE NEGATIVE Final    Comment: (NOTE) SARS-CoV-2 target nucleic acids are NOT DETECTED. The SARS-CoV-2 RNA is generally detectable in upper and lower respiratory specimens during the acute phase of infection. Negative results do not preclude SARS-CoV-2 infection, do not rule out co-infections with other pathogens, and should not be used as the sole basis for treatment or other patient management  decisions. Negative results must be combined with clinical observations, patient history, and epidemiological information. The expected result is Negative. Fact Sheet for Patients: SugarRoll.be Fact Sheet for Healthcare Providers: https://www.woods-mathews.com/ This test is not yet approved or cleared by the [place] FDA and  has been authorized for detection and/or diagnosis of SARS-CoV-2 by FDA under an Emergency Use Authorization (EUA). This EUA will remain  in effect (meaning this test can be used) for the duration of the COVID-19 declaration under Section 56 4(b)(1) of the Act, 21 U.S.C. section 360bbb-3(b)(1), unless the authorization is terminated or revoked sooner. Performed at [facility], [place] [address] [place], [place] [address]          Radiology Studies: No results found.      Scheduled Meds: . enoxaparin (LOVENOX) injection  40 mg Subcutaneous Q24H  . ferrous sulfate  325 mg Oral BID WC  . gabapentin  600 mg Oral TID  . insulin aspart  0-15 Units Subcutaneous TID WC  . insulin aspart  4 Units Subcutaneous TID WC  . insulin glargine  35 Units Subcutaneous QHS   Continuous Infusions: . sodium chloride 125 mL/hr at 03/19/19 0630  . sodium chloride       LOS: 0 days    Time spent:  25 minutes.    Berton MountSylvester Trinidad Ingle, MD  [facility] Pager #: [phone] 7PM-7AM contact night coverage as above

## 2019-03-19 NOTE — Progress Notes (Signed)
CRITICAL VALUE ALERT  Critical Value:  65 cbg  Date & Time Notied:  03/19/2019 at 1030  Provider Notified: Dr. Alcario Drought  Orders Received/Actions taken: given orange juice, pt alert and oriented.

## 2019-03-19 NOTE — Progress Notes (Signed)
Assumed care of patient for remainder of shift. Will continue to monitor close.

## 2019-03-20 LAB — RENAL FUNCTION PANEL
Albumin: 3.2 g/dL — ABNORMAL LOW (ref 3.5–5.0)
Anion gap: 8 (ref 5–15)
BUN: 12 mg/dL (ref 6–20)
CO2: 22 mmol/L (ref 22–32)
Calcium: 8.5 mg/dL — ABNORMAL LOW (ref 8.9–10.3)
Chloride: 104 mmol/L (ref 98–111)
Creatinine, Ser: 1.12 mg/dL (ref 0.61–1.24)
GFR calc Af Amer: >60 mL/min (ref >60)
GFR calc non Af Amer: >60 mL/min (ref >60)
Glucose, Bld: 158 mg/dL — ABNORMAL HIGH (ref 70–99)
Phosphorus: 2.7 mg/dL (ref 2.5–4.6)
Potassium: 4.2 mmol/L (ref 3.5–5.1)
Sodium: 134 mmol/L — ABNORMAL LOW (ref 135–145)

## 2019-03-20 LAB — CBC WITH DIFFERENTIAL/PLATELET
Abs Immature Granulocytes: 0.01 10*3/uL (ref 0.00–0.07)
Basophils Absolute: 0.1 10*3/uL (ref 0.0–0.1)
Basophils Relative: 1 %
Eosinophils Absolute: 0.2 10*3/uL (ref 0.0–0.5)
Eosinophils Relative: 5 %
HCT: 28.1 % — ABNORMAL LOW (ref 39.0–52.0)
Hemoglobin: 9.2 g/dL — ABNORMAL LOW (ref 13.0–17.0)
Immature Granulocytes: 0 %
Lymphocytes Relative: 35 %
Lymphs Abs: 1.5 10*3/uL (ref 0.7–4.0)
MCH: 27.1 pg (ref 26.0–34.0)
MCHC: 32.7 g/dL (ref 30.0–36.0)
MCV: 82.6 fL (ref 80.0–100.0)
Monocytes Absolute: 0.7 10*3/uL (ref 0.1–1.0)
Monocytes Relative: 15 %
Neutro Abs: 1.9 10*3/uL (ref 1.7–7.7)
Neutrophils Relative %: 44 %
Platelets: 370 10*3/uL (ref 150–400)
RBC: 3.4 MIL/uL — ABNORMAL LOW (ref 4.22–5.81)
RDW: 17.2 % — ABNORMAL HIGH (ref 11.5–15.5)
WBC: 4.3 10*3/uL (ref 4.0–10.5)
nRBC: 0 % (ref 0.0–0.2)

## 2019-03-20 LAB — HEMOGLOBIN A1C
Hgb A1c MFr Bld: 10 % — ABNORMAL HIGH (ref 4.8–5.6)
Mean Plasma Glucose: 240 mg/dL

## 2019-03-20 LAB — GLUCOSE, CAPILLARY
Glucose-Capillary: 387 mg/dL — ABNORMAL HIGH (ref 70–99)
Glucose-Capillary: 71 mg/dL (ref 70–99)
Glucose-Capillary: 154 mg/dL — ABNORMAL HIGH (ref 70–99)
Glucose-Capillary: 157 mg/dL — ABNORMAL HIGH (ref 70–99)
Glucose-Capillary: 467 mg/dL — ABNORMAL HIGH (ref 70–99)
Glucose-Capillary: 115 mg/dL — ABNORMAL HIGH (ref 70–99)

## 2019-03-20 LAB — MAGNESIUM: Magnesium: 2 mg/dL (ref 1.7–2.4)

## 2019-03-20 NOTE — Progress Notes (Addendum)
PROGRESS NOTE    Wilfredo Canterbury. Fusselman  TIW:[id number] DOB: [birth year] DOA: 03/18/2019 PCP: Ladell Pier, MD   Brief Narrative: Patient is a 53 year old African-American male, recently homeless, with past medical history significant for, hyperlipidemia, abnormal LFTs and diabetes mellitus was admitted to the hospital with orthostatic hypotension, acute kidney injury and volume depletion.  Hypoglycemia was noted during the hospital stay and insulin dose was adjusted.  During hospitalization, patient had feelings of depression and suicidal ideation and psychiatry was consulted.  Assessment & Plan:   Principal Problem:   Orthostatic hypotension Active Problems:   DM (diabetes mellitus), type 2, uncontrolled (HCC)   Hypertension   Depression   AKI (acute kidney injury) ([place])  Orthostatic hypotension: Improved.  Likely secondary to volume depletion and diabetic autonomic neuropathy.  Patient received IV fluids and hydration with improvement in his symptoms but he still feels a little dizzy on standing.  Patient was advised orthostatic precautions and adequate hydration.  Suicidal ideation with depression, manic depressive illness.  Expressed suicidal ideation twice during this hospitalization in the morning.  Seen by telepsychiatry.  On one-to-one sitter suicidal precautions and psychiatry recommends inpatient psych admission when medically clear.  Acute kidney injury: Suspect secondary to volume depletion.Improved with IV fluid hydration.  BMP in a.m.  Diabetes mellitus, uncontrolled: Patient states that he has brittle diabetes.  Was initially hypoglycemic and Lantus was at home. Continue with sliding scale insulin for now.  Patient is on Lantus twice a day at home. Hold lantus for now. Hopefully we can adjust his insulin prior to discharge.   hypertension: Watch closely. Lisinopril is currently on hold. Continue hydralazine.  Homelessness: Interior and spatial designer.  DVT prophylaxis:  Lovenox  Code Status: Full code  Family Communication:   None  Disposition Plan: Patient is homeless.  Psychiatry recommends inpatient psych admission.     Consultants:   Psychiatry  Procedures:   None  Antimicrobials:   None   Subjective: Patient is slightly irritable today.  He still feels little dizzy on standing up.  Objective: Vitals:   03/20/19 0122 03/20/19 0124 03/20/19 0530 03/20/19 1038  BP: 131/83 (!) 80/66 (!) 138/99 (!) 141/76  Pulse: 83 100 94 78  Resp:   18   Temp:   98.7 F (37.1 C)   TempSrc:   Oral   SpO2: 100% 100% 100% 100%  Weight:      Height:        Intake/Output Summary (Last 24 hours) at 03/20/2019 1304 Last data filed at 03/20/2019 0600 Gross per 24 hour  Intake 2538.4 ml  Output 3550 ml  Net -1011.6 ml   Filed Weights   03/18/19 1309  Weight: 74.8 kg    Examination: General:  Average built, not in obvious distress, thinly built HENT: Normocephalic, pupils equally reacting to light and accommodation.  No scleral pallor or icterus noted. Oral mucosa is moist.  Chest:  Clear breath sounds.  Diminished breath sounds bilaterally. No crackles or wheezes.  CVS: S1 &S2 heard. No murmur.  Regular rate and rhythm. Abdomen: Soft, nontender, nondistended.  Bowel sounds are heard.  Liver is not palpable, no abdominal mass palpated Extremities: No cyanosis, clubbing or edema.  Peripheral pulses are palpable. Psych: Alert, awake and oriented, slightly depressed mood CNS:  No cranial nerve deficits.  Power equal in all extremities.  No sensory deficits noted.  No cerebellar signs.   Skin: Warm and dry.  No rashes noted.   Data Reviewed: I have personally reviewed  following labs and imaging studies  CBC: Recent Labs  Lab 03/18/19 1403 03/20/19 0756  WBC 6.9 4.3  NEUTROABS 4.7 1.9  HGB 10.0* 9.2*  HCT 31.6* 28.1*  MCV 83.4 82.6  PLT 408* 370   Basic Metabolic Panel: Recent Labs  Lab 03/18/19 1403 03/18/19 1815 03/19/19 0317  03/20/19 0756  NA 132* 130* 131* 134*  K 3.9 4.4 4.9 4.2  CL 97* 98 100 104  CO2 21* 20* 20* 22  GLUCOSE 83 378* 531* 158*  BUN 34* 30* 23* 12  CREATININE 1.84* 1.65* 1.42* 1.12  CALCIUM 9.5 8.6* 8.3* 8.5*  MG 2.2  --   --  2.0  PHOS  --   --   --  2.7   GFR: Estimated Creatinine Clearance: 78.8 mL/min (by C-G formula based on SCr of 1.12 mg/dL). Liver Function Tests: Recent Labs  Lab 03/20/19 0756  ALBUMIN 3.2*   No results for input(s): LIPASE, AMYLASE in the last 168 hours. No results for input(s): AMMONIA in the last 168 hours. Coagulation Profile: No results for input(s): INR, PROTIME in the last 168 hours. Cardiac Enzymes: No results for input(s): CKTOTAL, CKMB, CKMBINDEX, TROPONINI in the last 168 hours. BNP (last 3 results) No results for input(s): PROBNP in the last 8760 hours. HbA1C: Recent Labs    03/18/19 1413  HGBA1C 10.0*   CBG: Recent Labs  Lab 03/19/19 1850 03/19/19 2059 03/20/19 0126 03/20/19 0834 03/20/19 1132  GLUCAP 187* 102* 71 154* 157*   Lipid Profile: No results for input(s): CHOL, HDL, LDLCALC, TRIG, CHOLHDL, LDLDIRECT in the last 72 hours. Thyroid Function Tests: No results for input(s): TSH, T4TOTAL, FREET4, T3FREE, THYROIDAB in the last 72 hours. Anemia Panel: No results for input(s): VITAMINB12, FOLATE, FERRITIN, TIBC, IRON, RETICCTPCT in the last 72 hours. Urine analysis:    Component Value Date/Time   COLORURINE YELLOW 12/31/2016 1505   APPEARANCEUR HAZY (A) 12/31/2016 1505   LABSPEC 1.014 12/31/2016 1505   PHURINE 6.0 12/31/2016 1505   GLUCOSEU >=500 (A) 12/31/2016 1505   HGBUR SMALL (A) 12/31/2016 1505   BILIRUBINUR NEGATIVE 12/31/2016 1505   BILIRUBINUR Negative 12/13/2014 1510   KETONESUR NEGATIVE 12/31/2016 1505   PROTEINUR 100 (A) 12/31/2016 1505   UROBILINOGEN 0.2 12/13/2014 1510   UROBILINOGEN 1.0 10/31/2007 1621   NITRITE NEGATIVE 12/31/2016 1505   LEUKOCYTESUR NEGATIVE 12/31/2016 1505   Sepsis Labs:  @[email or web address] (procalcitonin:4,lacticidven:4)  ) Recent Results (from the past 240 hour(s))  SARS CORONAVIRUS 2 (TAT 6-24 HRS) Nasopharyngeal Nasopharyngeal Swab     Status: None   Collection Time: 03/18/19 11:07 PM   Specimen: Nasopharyngeal Swab  Result Value Ref Range Status   SARS Coronavirus 2 NEGATIVE NEGATIVE Final    Comment: (NOTE) SARS-CoV-2 target nucleic acids are NOT DETECTED. The SARS-CoV-2 RNA is generally detectable in upper and lower respiratory specimens during the acute phase of infection. Negative results do not preclude SARS-CoV-2 infection, do not rule out co-infections with other pathogens, and should not be used as the sole basis for treatment or other patient management decisions. Negative results must be combined with clinical observations, patient history, and epidemiological information. The expected result is Negative. Fact Sheet for Patients: HairSlick.nohttps://www.fda.gov/media/138098/download Fact Sheet for Healthcare Providers: quierodirigir.comhttps://www.fda.gov/media/138095/download This test is not yet approved or cleared by the [place] FDA and  has been authorized for detection and/or diagnosis of SARS-CoV-2 by FDA under an Emergency Use Authorization (EUA). This EUA will remain  in effect (meaning this test can be used) for the duration  of the COVID-19 declaration under Section 56 4(b)(1) of the Act, 21 U.S.C. section 360bbb-3(b)(1), unless the authorization is terminated or revoked sooner. Performed at [facility], [address] [place], [place] [address]        Radiology Studies: No results found.     Scheduled Meds: . enoxaparin (LOVENOX) injection  40 mg Subcutaneous Q24H  . ferrous sulfate  325 mg Oral BID WC  . gabapentin  600 mg Oral TID  . insulin aspart  0-15 Units Subcutaneous TID WC   Continuous Infusions: . sodium chloride 125 mL/hr at 03/20/19 1301     LOS: 1 day   Loistine Chance, MD [facility]

## 2019-03-20 NOTE — Consult Note (Signed)
Telepsych Consultation   Reason for Consult: Depression Referring Physician: Attending physician [facility]  Patient Identification: Cody Campbell MRN:  [id number] Principal Diagnosis: Orthostatic hypotension Diagnosis:  Principal Problem:   Orthostatic hypotension Active Problems:   DM (diabetes mellitus), type 2, uncontrolled (HCC)   Hypertension   Depression   AKI (acute kidney injury) (HCC)   Total Time spent with patient: 30 minutes  Subjective:   Cody Campbell is a 53 y.o. male patient admitted with dizziness and nausea  HPI: 53 year old male with a history of diabetes, hypertension, hyperlipidemia, depression.  Presented to hospital on 10/17 reporting weakness, dizziness making it difficult for him to stand up.  He also reported feeling lethargic.  He was found to be orthostatic.  Initial creatinine was elevated to 1.8.  Was admitted with diagnosis of orthostatic hypotension, acute kidney injury, diabetes mellitus 2.  Psychiatry consultation was requested as patient presented with depression and expressed suicidal ideations. Today patient reports feeling sad, frustrated, depressed in the context of significant stressors.  States that "I worked all my life, and now that I cannot I am useless".  States he lost his job sometime ago due to chronic medical illnesses and neuropathy.  He is now unemployed and homeless.  Reports that he has an adult son who is doing well and has a house but who has declined to let him live there .  States he feels like "nobody cares anymore about me".  In the context of above stressors acknowledges he has had suicidal thoughts, mainly passive, although also states that he has had thoughts of walking into traffic. Presents intermittently tearful and sad and affect during session. Denies psychotic symptoms, endorses anhedonia and decreased energy level. Reports he had been  prescribed Cymbalta but states it was not helping, apparently will not take them prior to admission. He denies alcohol or drug abuse    Past Psychiatric History: Reports history of depression, as noted has been treated with Cymbalta in the past although was not taking prior to admission.  Denies history of suicide attempts, denies history of psychosis.  No clear history of hypomania or mania.  Risk to Self:  As above Risk to Others:  Does not endorse HI Prior Inpatient Therapy:  Denies Prior Outpatient Therapy:    Past Medical History:  Past Medical History:  Diagnosis Date  . Diabetes mellitus without complication (HCC)   . Elevated LFTs 05/21/2011  . Hyperlipidemia   . Hypertension    No past surgical history on file. Family History:  Family History  Problem Relation Age of Onset  . Diabetes Mother   . Hypertension Father   . Diabetes Father   . Heart disease Father    Family Psychiatric  History:  Social History:  Social History   Substance and Sexual Activity  Alcohol Use Yes  . Alcohol/week: 1.0 standard drinks  . Types: 1 Shots of liquor per week   Comment: occasionally, 1 beer a day     Social History   Substance and Sexual Activity  Drug Use Yes  . Types: Marijuana    Social History   Socioeconomic History  . Marital status: Married    Spouse name: Not on file  . Number of children: Not on file  . Years of education: Not on file  . Highest education level: Not on file  Occupational History  . Not on file  Social Needs  . Financial  resource strain: Not on file  . Food insecurity    Worry: Not on file    Inability: Not on file  . Transportation needs    Medical: Not on file    Non-medical: Not on file  Tobacco Use  . Smoking status: Current Every Day Smoker    Packs/day: 2.00    Years: 35.00    Pack years: 70.00    Types: Cigars  . Smokeless tobacco: Current User  . Tobacco comment: Smoking 1-2 small cigars daily  Substance and Sexual  Activity  . Alcohol use: Yes    Alcohol/week: 1.0 standard drinks    Types: 1 Shots of liquor per week    Comment: occasionally, 1 beer a day  . Drug use: Yes    Types: Marijuana  . Sexual activity: Yes    Partners: Female    Birth control/protection: Condom  Lifestyle  . Physical activity    Days per week: Not on file    Minutes per session: Not on file  . Stress: Not on file  Relationships  . Social Musician on phone: Not on file    Gets together: Not on file    Attends religious service: Not on file    Active member of club or organization: Not on file    Attends meetings of clubs or organizations: Not on file    Relationship status: Not on file  Other Topics Concern  . Not on file  Social History Narrative  . Not on file   Additional Social History:    Allergies:   Allergies  Allergen Reactions  . Metformin And Related Diarrhea    Labs:  Results for orders placed or performed during the hospital encounter of 03/18/19 (from the past 48 hour(s))  Troponin I (High Sensitivity)     Status: Abnormal   Collection Time: 03/18/19  4:07 PM  Result Value Ref Range   Troponin I (High Sensitivity) 44 (H) <18 ng/L    Comment: (NOTE) Elevated high sensitivity troponin I (hsTnI) values and significant  changes across serial measurements may suggest ACS but many other  chronic and acute conditions are known to elevate hsTnI results.  Refer to the "Links" section for chest pain algorithms and additional  guidance. Performed at [facility], [address], [place], [place] [address]   Basic metabolic panel     Status: Abnormal   Collection Time: 03/18/19  6:15 PM  Result Value Ref Range   Sodium 130 (L) 135 - 145 mmol/L   Potassium 4.4 3.5 - 5.1 mmol/L   Chloride 98 98 - 111 mmol/L   CO2 20 (L) 22 - 32 mmol/L   Glucose, Bld 378 (H) 70 - 99 mg/dL   BUN 30 (H) 6 - 20 mg/dL   Creatinine, Ser 8.93 (H) 0.61 - 1.24 mg/dL   Calcium 8.6 (L) 8.9 - 10.3 mg/dL    GFR calc non Af Amer 46 (L) >60 mL/min   GFR calc Af Amer 53 (L) >60 mL/min   Anion gap 12 5 - 15    Comment: Performed at [facility], [address], [place], [place] [address]  CBG monitoring, ED     Status: Abnormal   Collection Time: 03/18/19  8:51 PM  Result Value Ref Range   Glucose-Capillary 378 (H) 70 - 99 mg/dL  CBG monitoring, ED     Status: Abnormal   Collection Time: 03/18/19 10:50 PM  Result Value Ref Range  Glucose-Capillary 454 (H) 70 - 99 mg/dL  SARS CORONAVIRUS 2 (TAT 6-24 HRS) Nasopharyngeal Nasopharyngeal Swab     Status: None   Collection Time: 03/18/19 11:07 PM   Specimen: Nasopharyngeal Swab  Result Value Ref Range   SARS Coronavirus 2 NEGATIVE NEGATIVE    Comment: (NOTE) SARS-CoV-2 target nucleic acids are NOT DETECTED. The SARS-CoV-2 RNA is generally detectable in upper and lower respiratory specimens during the acute phase of infection. Negative results do not preclude SARS-CoV-2 infection, do not rule out co-infections with other pathogens, and should not be used as the sole basis for treatment or other patient management decisions. Negative results must be combined with clinical observations, patient history, and epidemiological information. The expected result is Negative. Fact Sheet for Patients: SugarRoll.be Fact Sheet for Healthcare Providers: https://www.woods-mathews.com/ This test is not yet approved or cleared by the [place] FDA and  has been authorized for detection and/or diagnosis of SARS-CoV-2 by FDA under an Emergency Use Authorization (EUA). This EUA will remain  in effect (meaning this test can be used) for the duration of the COVID-19 declaration under Section 56 4(b)(1) of the Act, 21 U.S.C. section 360bbb-3(b)(1), unless the authorization is terminated or revoked sooner. Performed at [facility], [place] [address], [place], [place] [address]   Glucose, capillary      Status: Abnormal   Collection Time: 03/18/19 11:29 PM  Result Value Ref Range   Glucose-Capillary 387 (H) 70 - 99 mg/dL  HIV Antibody (routine testing w rflx)     Status: None   Collection Time: 03/19/19  3:17 AM  Result Value Ref Range   HIV Screen 4th Generation wRfx NON REACTIVE NON REACTIVE    Comment: Performed at [facility], [place] [address] [place], [place] [address]  Basic metabolic panel     Status: Abnormal   Collection Time: 03/19/19  3:17 AM  Result Value Ref Range   Sodium 131 (L) 135 - 145 mmol/L   Potassium 4.9 3.5 - 5.1 mmol/L   Chloride 100 98 - 111 mmol/L   CO2 20 (L) 22 - 32 mmol/L   Glucose, Bld 531 (HH) 70 - 99 mg/dL    Comment: CRITICAL RESULT CALLED TO, READ BACK BY AND VERIFIED WITH: RN J CARSON @0522  03/19/19 BY S GEZAHEGN    BUN 23 (H) 6 - 20 mg/dL   Creatinine, Ser 1.42 (H) 0.61 - 1.24 mg/dL   Calcium 8.3 (L) 8.9 - 10.3 mg/dL   GFR calc non Af Amer 55 (L) >60 mL/min   GFR calc Af Amer >60 >60 mL/min   Anion gap 11 5 - 15    Comment: Performed at [place] [address], [place], [place] [address]  Glucose, capillary     Status: Abnormal   Collection Time: 03/19/19  8:50 AM  Result Value Ref Range   Glucose-Capillary 144 (H) 70 - 99 mg/dL  Glucose, capillary     Status: Abnormal   Collection Time: 03/19/19 10:06 AM  Result Value Ref Range   Glucose-Capillary 39 (LL) 70 - 99 mg/dL   Comment 1 Repeat Test   Glucose, capillary     Status: Abnormal   Collection Time: 03/19/19 10:14 AM  Result Value Ref Range   Glucose-Capillary 45 (L) 70 - 99 mg/dL  Glucose, capillary     Status: Abnormal   Collection Time: 03/19/19 10:27 AM  Result Value Ref Range   Glucose-Capillary 69 (L) 70 - 99 mg/dL  Glucose,  capillary     Status: None   Collection Time: 03/19/19 10:29 AM  Result Value Ref Range   Glucose-Capillary 80 70 - 99 mg/dL  Glucose, capillary     Status: Abnormal   Collection Time: 03/19/19 12:19 PM  Result Value Ref Range    Glucose-Capillary 50 (L) 70 - 99 mg/dL  Glucose, capillary     Status: Abnormal   Collection Time: 03/19/19  1:17 PM  Result Value Ref Range   Glucose-Capillary 140 (H) 70 - 99 mg/dL  Glucose, capillary     Status: Abnormal   Collection Time: 03/19/19  3:02 PM  Result Value Ref Range   Glucose-Capillary 150 (H) 70 - 99 mg/dL  Glucose, capillary     Status: Abnormal   Collection Time: 03/19/19  4:20 PM  Result Value Ref Range   Glucose-Capillary 172 (H) 70 - 99 mg/dL  Glucose, capillary     Status: Abnormal   Collection Time: 03/19/19  6:50 PM  Result Value Ref Range   Glucose-Capillary 187 (H) 70 - 99 mg/dL  Glucose, capillary     Status: Abnormal   Collection Time: 03/19/19  8:59 PM  Result Value Ref Range   Glucose-Capillary 102 (H) 70 - 99 mg/dL  Glucose, capillary     Status: None   Collection Time: 03/20/19  1:26 AM  Result Value Ref Range   Glucose-Capillary 71 70 - 99 mg/dL  Renal function panel     Status: Abnormal   Collection Time: 03/20/19  7:56 AM  Result Value Ref Range   Sodium 134 (L) 135 - 145 mmol/L   Potassium 4.2 3.5 - 5.1 mmol/L   Chloride 104 98 - 111 mmol/L   CO2 22 22 - 32 mmol/L   Glucose, Bld 158 (H) 70 - 99 mg/dL   BUN 12 6 - 20 mg/dL   Creatinine, Ser 1.61 0.61 - 1.24 mg/dL   Calcium 8.5 (L) 8.9 - 10.3 mg/dL   Phosphorus 2.7 2.5 - 4.6 mg/dL   Albumin 3.2 (L) 3.5 - 5.0 g/dL   GFR calc non Af Amer >60 >60 mL/min   GFR calc Af Amer >60 >60 mL/min   Anion gap 8 5 - 15    Comment: Performed at [facility], [address] [place], [place] [address]  CBC with Differential/Platelet     Status: Abnormal   Collection Time: 03/20/19  7:56 AM  Result Value Ref Range   WBC 4.3 4.0 - 10.5 K/uL   RBC 3.40 (L) 4.22 - 5.81 MIL/uL   Hemoglobin 9.2 (L) 13.0 - 17.0 g/dL   HCT 54.0 (L) 98.1 - 19.1 %   MCV 82.6 80.0 - 100.0 fL   MCH 27.1 26.0 - 34.0 pg   MCHC 32.7 30.0 - 36.0 g/dL   RDW 47.8 (H) 29.5 - 62.1 %   Platelets 370 150 - 400 K/uL   nRBC 0.0  0.0 - 0.2 %   Neutrophils Relative % 44 %   Neutro Abs 1.9 1.7 - 7.7 K/uL   Lymphocytes Relative 35 %   Lymphs Abs 1.5 0.7 - 4.0 K/uL   Monocytes Relative 15 %   Monocytes Absolute 0.7 0.1 - 1.0 K/uL   Eosinophils Relative 5 %   Eosinophils Absolute 0.2 0.0 - 0.5 K/uL   Basophils Relative 1 %   Basophils Absolute 0.1 0.0 - 0.1 K/uL   Immature Granulocytes 0 %   Abs Immature Granulocytes 0.01 0.00 - 0.07 K/uL    Comment:  Performed at [facility], [address] [place], [place] [address]  Magnesium     Status: None   Collection Time: 03/20/19  7:56 AM  Result Value Ref Range   Magnesium 2.0 1.7 - 2.4 mg/dL    Comment: Performed at [facility], [address] [place], [place] [address]  Glucose, capillary     Status: Abnormal   Collection Time: 03/20/19  8:34 AM  Result Value Ref Range   Glucose-Capillary 154 (H) 70 - 99 mg/dL  Glucose, capillary     Status: Abnormal   Collection Time: 03/20/19 11:32 AM  Result Value Ref Range   Glucose-Capillary 157 (H) 70 - 99 mg/dL    Medications:  Current Facility-Administered Medications  Medication Dose Route Frequency Provider Last Rate Last Dose  . 0.9 %  sodium chloride infusion   Intravenous Continuous Pokhrel, Laxman, MD 125 mL/hr at 03/20/19 1301    . acetaminophen (TYLENOL) tablet 650 mg  650 mg Oral Q6H PRN Hillary Bow, DO   650 mg at 03/19/19 0981   Or  . acetaminophen (TYLENOL) suppository 650 mg  650 mg Rectal Q6H PRN Hillary Bow, DO      . enoxaparin (LOVENOX) injection 40 mg  40 mg Subcutaneous Q24H Lyda Perone M, DO   40 mg at 03/20/19 0948  . ferrous sulfate tablet 325 mg  325 mg Oral BID WC Lyda Perone M, DO   325 mg at 03/20/19 0945  . gabapentin (NEURONTIN) capsule 600 mg  600 mg Oral TID Hillary Bow, DO   600 mg at 03/20/19 0945  . hydrALAZINE (APRESOLINE) tablet 25 mg  25 mg Oral Q6H PRN Berton Mount I, MD      . insulin aspart (novoLOG) injection 0-15 Units  0-15 Units  Subcutaneous TID WC Hillary Bow, DO   3 Units at 03/20/19 1302  . ondansetron (ZOFRAN) tablet 4 mg  4 mg Oral Q6H PRN Hillary Bow, DO       Or  . ondansetron [facility]) injection 4 mg  4 mg Intravenous Q6H PRN Hillary Bow, DO      . oxymetazoline (AFRIN) 0.05 % nasal spray 1 spray  1 spray Each Nare BID PRN Hillary Bow, DO        Musculoskeletal: Strength & Muscle Tone: within normal limits Gait & Station: In bed, ambulation not reviewed/ evaluated Patient leans: N/A  Psychiatric Specialty Exam: Physical Exam  ROS reports dry mouth, peripheral neuropathy symptoms  Blood pressure (!) 165/94, pulse 78, temperature 98.9 F (37.2 C), temperature source Oral, resp. rate 19, height  (1.88 m), weight 74.8 kg, SpO2 100 %.Body mass index is 21.18 kg/m.  General Appearance: Fairly Groomed in hospital garb  Eye Contact:  Fair  Speech:  Normal Rate  Volume:  Normal  Mood:  Depressed  Affect:  Constricted and also vaguely irritable, tearful at times during session  Thought Process:  Linear and Descriptions of Associations: Intact  Orientation:  Other:  Alert and attentive  Thought Content:  No hallucinations, no delusions, does not appear internally preoccupied  Suicidal Thoughts:  Endorses passive suicidal ideations, also reports intermittent suicidal thoughts of walking into traffic  Homicidal Thoughts:  No  Memory:  Recent and remote grossly intact  Judgement:  Fair  Insight:  Fair  Psychomotor Activity:  Decreased-no psychomotor agitation noted  Concentration:  Concentration: Good and Attention Span: Good  Recall:  Good  Fund of Knowledge:  Good  Language:  Good  Akathisia:  Negative  Handed:  Right  AIMS (if indicated):     Assets:  Resilience  ADL's:  Intact  Cognition:  WNL  Sleep:        Treatment Plan Summary: See below  Disposition: Recommend psychiatric Inpatient admission when medically cleared. 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  Based on presence of suicidal ideations, poor support network, severe stressors, inpatient psychiatric admission when medically cleared is indicated. Continue one-to-one observation for safety Regarding psychiatric medications patient reports that Cymbalta which had been prescribed prior to admission was not working.  Would consider Zoloft, starting at 50 mg daily. Please review recommendations with unit CSW for referrals to inpatient psychiatric setting on medical clearance  Names of all persons participating in this telemedicine service and their role in this encounter. Name: Nehemiah Massed Role: MD, psychiatrist             Craige Cotta, MD 03/20/2019 3:38 PM

## 2019-03-21 LAB — MAGNESIUM: Magnesium: 1.8 mg/dL (ref 1.7–2.4)

## 2019-03-21 LAB — BASIC METABOLIC PANEL
Anion gap: 8 (ref 5–15)
BUN: 17 mg/dL (ref 6–20)
CO2: 24 mmol/L (ref 22–32)
Calcium: 8.8 mg/dL — ABNORMAL LOW (ref 8.9–10.3)
Chloride: 103 mmol/L (ref 98–111)
Creatinine, Ser: 1.2 mg/dL (ref 0.61–1.24)
GFR calc Af Amer: >60 mL/min (ref >60)
GFR calc non Af Amer: >60 mL/min (ref >60)
Glucose, Bld: 387 mg/dL — ABNORMAL HIGH (ref 70–99)
Potassium: 5 mmol/L (ref 3.5–5.1)
Sodium: 135 mmol/L (ref 135–145)

## 2019-03-21 LAB — CBC
HCT: 27.6 % — ABNORMAL LOW (ref 39.0–52.0)
Hemoglobin: 8.9 g/dL — ABNORMAL LOW (ref 13.0–17.0)
MCH: 26.8 pg (ref 26.0–34.0)
MCHC: 32.2 g/dL (ref 30.0–36.0)
MCV: 83.1 fL (ref 80.0–100.0)
Platelets: 372 10*3/uL (ref 150–400)
RBC: 3.32 MIL/uL — ABNORMAL LOW (ref 4.22–5.81)
RDW: 17.5 % — ABNORMAL HIGH (ref 11.5–15.5)
WBC: 4.2 10*3/uL (ref 4.0–10.5)
nRBC: 0 % (ref 0.0–0.2)

## 2019-03-21 LAB — GLUCOSE, CAPILLARY
Glucose-Capillary: 423 mg/dL — ABNORMAL HIGH (ref 70–99)
Glucose-Capillary: 312 mg/dL — ABNORMAL HIGH (ref 70–99)
Glucose-Capillary: 278 mg/dL — ABNORMAL HIGH (ref 70–99)
Glucose-Capillary: 267 mg/dL — ABNORMAL HIGH (ref 70–99)
Glucose-Capillary: 137 mg/dL — ABNORMAL HIGH (ref 70–99)
Glucose-Capillary: 257 mg/dL — ABNORMAL HIGH (ref 70–99)

## 2019-03-21 MED ORDER — LISINOPRIL 2.5 MG PO TABS
2.5000 mg | ORAL_TABLET | Freq: Every day | ORAL | Status: DC
  Administered 2019-03-21 – 2019-03-22 (×2): 2.5 mg via ORAL
  Filled 2019-03-21 (×2): qty 1

## 2019-03-21 MED ORDER — INSULIN GLARGINE 100 UNIT/ML ~~LOC~~ SOLN
40.0000 [IU] | Freq: Every morning | SUBCUTANEOUS | Status: DC
  Administered 2019-03-21: 40 [IU] via SUBCUTANEOUS
  Filled 2019-03-21 (×2): qty 0.4

## 2019-03-21 MED ORDER — INSULIN ASPART 100 UNIT/ML ~~LOC~~ SOLN
8.0000 [IU] | Freq: Once | SUBCUTANEOUS | Status: AC
  Administered 2019-03-21: 8 [IU] via SUBCUTANEOUS

## 2019-03-21 NOTE — Progress Notes (Signed)
Inpatient Diabetes Program Recommendations  AACE/ADA: New Consensus Statement on Inpatient Glycemic Control (2015)  Target Ranges:  Prepandial:   less than 140 mg/dL      Peak postprandial:   less than 180 mg/dL (1-2 hours)      Critically ill patients:  140 - 180 mg/dL   Lab Results  Component Value Date   GLUCAP 267 (H) 03/21/2019   HGBA1C 10.0 (H) 03/18/2019    Review of Glycemic Control Results for Cody Campbell, Cody Campbell (MRN [id number]) as of 03/21/2019 15:46  Ref. Range 03/21/2019 03:07 03/21/2019 06:08 03/21/2019 08:17 03/21/2019 12:11  Glucose-Capillary Latest Ref Range: 70 - 99 mg/dL 423 (H) 312 (H) 278 (H) 267 (H)   Diabetes history: DM2 Outpatient Diabetes medications: Lantus 35 units + Novolog 6 units tid meal coverage Current orders for Inpatient glycemic control: Lantus 40 units + Novolog moderate tid  Inpatient Diabetes Program Recommendations:   -Add Novolog 5 units tid meal coverage if eats 50%  Thank you, Bethena Roys E. Leyton Magoon, RN, MSN, CDE  Diabetes Coordinator Inpatient Glycemic Control Team Team Pager [phone] (8am-5pm) 03/21/2019 3:50 PM

## 2019-03-21 NOTE — Plan of Care (Signed)

## 2019-03-21 NOTE — Social Work (Signed)
Referral made to Sarah at [facility] disposition office. Will also f/u with [facility] and pt to assess for voluntary admission.   Westley Hummer, MSW, [place] Work [phone]

## 2019-03-21 NOTE — TOC Initial Note (Signed)
Transition of Care (TOC) - Initial/Assessment Note    Patient Details  Name: Cody Campbell. Gerrard MRN: [id number] Date of Birth: 1965-06-21  Transition of Care [facility]) CM/SW Contact:    Alexander [place], [place] Phone Number: 03/21/2019, 2:07 PM  Clinical Narrative:                 CSW spoke with pt at bedside. Introduced self, role, reason for visit.  Pt has been homeless after recently being let go from his job. He cites multiple life stressors including job loss, homelessness, and other issues with medication side affects that compound with ongoing medical needs. Pt originally from [place], we discuss his confidence and perseverance and how that is an asset for him. Discussed referrals to [facility] including [facility]. At this time neither have a bed will extend search. Pt has signed voluntary form for admission when bed available.   Expected Discharge Plan: Psychiatric Hospital Barriers to Discharge: Psych Bed not available, Continued Medical Work up   Patient Goals and CMS Choice Patient states their goals for this hospitalization and ongoing recovery are:: to have some support with his feelings of hopelessness CMS Medicare.gov Compare Post Acute Care list provided to:: (n/a) Choice offered to / list presented to : NA  Expected Discharge Plan and Services Expected Discharge Plan: Psychiatric Hospital In-house Referral: Clinical Social Work Discharge Planning Services: CM Consult Post Acute Care Choice: NA Living arrangements for the past 2 months: No permanent address    Prior Living Arrangements/Services Living arrangements for the past 2 months: No permanent address Lives with:: Self Patient language and need for interpreter reviewed:: Yes Do you feel safe going back to the place where you live?: Yes      Need for Family Participation in Patient Care: Yes (Comment)(assistance/support) Care giver support system in place?: No (comment)(pt denies supports)   Criminal  Activity/Legal Involvement Pertinent to Current Situation/Hospitalization: No - Comment as needed  Activities of Daily Living      Permission Sought/Granted Permission sought to share information with : Facility Sport and exercise psychologist, Family Supports Permission granted to share information with : Yes, Verbal Permission Granted   Permission granted to share info w AGENCY: [facility]   Emotional Assessment Appearance:: Appears stated age Attitude/Demeanor/Rapport: Engaged, Self-Confident Affect (typically observed): Accepting, Adaptable Orientation: : Oriented to Self, Oriented to Place, Oriented to  Time, Oriented to Situation Alcohol / Substance Use: Alcohol Use, Illicit Drugs Psych Involvement: Outpatient Provider  Admission diagnosis:  Orthostatic hypotension [I95.1] Dehydration [E86.0] Lightheadedness [R42] Patient Active Problem List   Diagnosis Date Noted  . Orthostatic hypotension 03/18/2019  . AKI (acute kidney injury) ([place]) 03/18/2019  . Dizziness 01/05/2017  . Anemia 01/05/2017  . Tinnitus of both ears 10/15/2016  . Plantar fascial fibromatosis of both feet 10/15/2016  . Depression 10/15/2016  . Gum disease 10/15/2016  . Degenerative disc disease, lumbar 05/15/2013  . Diabetic neuropathy ([place]) 03/16/2013  . Fibrous nodule 11/14/2012  . Hypertension 05/20/2011  . UNSPECIFIED TACHYCARDIA 09/19/2008  . DM (diabetes mellitus), type 2, uncontrolled ([place]) 07/29/2006  . HYPERLIPIDEMIA 07/29/2006  . 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 TOBACCO DEPENDENCE 07/29/2006  . INSOMNIA NOS 07/29/2006   PCP:  Ladell Pier, MD Pharmacy:   [facility], [address] [place] [address] Phone: [phone] Fax: [phone]  [facility] [place], [place]  [address] Phone: [phone] Fax: [phone]     Social Determinants of Health  (SDOH) Interventions    Readmission Risk Interventions Readmission Risk Prevention Plan 03/21/2019  Post Dischage Appt Not Complete  Appt Comments plan for [facility] placement.  Medication Screening Complete  Transportation Screening Complete  Some recent data might be hidden

## 2019-03-21 NOTE — Progress Notes (Signed)
PROGRESS NOTE    Rafal Archuleta. Asbridge  STM:[id number] DOB: [birth year] DOA: 03/18/2019 PCP: Marcine Matar, MD   Brief Narrative: Patient is a 53 year old African-American male, recently homeless, with past medical history significant for, hyperlipidemia, abnormal LFTs and diabetes mellitus was admitted to the hospital with orthostatic hypotension, acute kidney injury and volume depletion.  Hypoglycemia was noted during the hospital stay and insulin dose was adjusted.  During hospitalization, patient had feelings of depression and suicidal ideation and psychiatry was consulted.  Assessment & Plan:   Principal Problem:   Orthostatic hypotension Active Problems:   DM (diabetes mellitus), type 2, uncontrolled (HCC)   Hypertension   Depression   AKI (acute kidney injury) (HCC)  Orthostatic hypotension: Improved.  Likely secondary to volume depletion and diabetic autonomic neuropathy.  Patient received IV fluids and hydration with improvement in his symptoms but he still feels a little dizzy on standing.  Patient was advised orthostatic precautions and adequate hydration.  Suicidal ideation with depression, manic depressive illness.  Expressed suicidal ideation twice during this hospitalization in the morning.  Seen by telepsychiatry.  On one-to-one sitter suicidal precautions and psychiatry recommends inpatient psych admission when medically clear.  Acute kidney injury: Suspect secondary to volume depletion.Improved with IV fluid hydration.  BMP in a.m.  Diabetes mellitus, uncontrolled: Patient states that he has brittle diabetes.  Was initially hypoglycemic and Lantus was at home. Continue with sliding scale insulin for now.  Patient is on Lantus twice a day at home.  Will restart once a day starting this morning.  Will closely monitor during the daytime.  Hopefully we can adjust his insulin prior to discharge.  Accelerated hypertension: Today's blood pressure is accelerated.  Discontinue  normal saline.  Lisinopril is currently on hold.  Will continue to monitor blood pressure closely.  Will restart lisinopril.  Continue hydralazine.  Homelessness: Pharmacist, community.  DVT prophylaxis: Lovenox  Code Status: Full code  Family Communication:   None  Disposition Plan: Patient is homeless.  Psychiatry recommends inpatient psych admission.  We will try to optimize her blood glucose levels and blood pressure today.  Likely stable by tomorrow for psychiatry facility admission.   Consultants:   Psychiatry  Procedures:   None  Antimicrobials:   None   Subjective: Patient is slightly irritable today.  He still feels little dizzy on standing up.  Objective: Vitals:   03/20/19 1834 03/20/19 2116 03/21/19 0514 03/21/19 0915  BP: (!) 177/88 134/88 (!) 162/76 (!) 177/111  Pulse: 82 79 74 97  Resp: 18 18 18 20   Temp: 98 F (36.7 C) 98.3 F (36.8 C) 98.6 F (37 C) 98.4 F (36.9 C)  TempSrc: Oral Oral Oral Oral  SpO2: 100% 100% 100% 100%  Weight:      Height:        Intake/Output Summary (Last 24 hours) at 03/21/2019 1233 Last data filed at 03/21/2019 0947 Gross per 24 hour  Intake 3200.46 ml  Output 1075 ml  Net 2125.46 ml   Filed Weights   03/18/19 1309  Weight: 74.8 kg    Examination: General:  Average built, not in obvious distress, thinly built, slightly irritable HENT: Normocephalic, pupils equally reacting to light and accommodation.  No scleral pallor or icterus noted. Oral mucosa is moist.  Chest:  Clear breath sounds.  Diminished breath sounds bilaterally. No crackles or wheezes.  CVS: S1 &S2 heard. No murmur.  Regular rate and rhythm. Abdomen: Soft, nontender, nondistended.  Bowel sounds are heard.  Liver is not palpable, no abdominal mass palpated Extremities: No cyanosis, clubbing or edema.  Peripheral pulses are palpable. Psych: Alert, awake and oriented, slightly depressed mood, slightly irritable CNS:  No cranial nerve deficits.   Power equal in all extremities.  No sensory deficits noted.  No cerebellar signs.   Skin: Warm and dry.  No rashes noted.   Data Reviewed: I have personally reviewed following labs and imaging studies  CBC: Recent Labs  Lab 03/18/19 1403 03/20/19 0756 03/21/19 0252  WBC 6.9 4.3 4.2  NEUTROABS 4.7 1.9  --   HGB 10.0* 9.2* 8.9*  HCT 31.6* 28.1* 27.6*  MCV 83.4 82.6 83.1  PLT 408* 370 372   Basic Metabolic Panel: Recent Labs  Lab 03/18/19 1403 03/18/19 1815 03/19/19 0317 03/20/19 0756 03/21/19 0252  NA 132* 130* 131* 134* 135  K 3.9 4.4 4.9 4.2 5.0  CL 97* 98 100 104 103  CO2 21* 20* 20* 22 24  GLUCOSE 83 378* 531* 158* 387*  BUN 34* 30* 23* 12 17  CREATININE 1.84* 1.65* 1.42* 1.12 1.20  CALCIUM 9.5 8.6* 8.3* 8.5* 8.8*  MG 2.2  --   --  2.0 1.8  PHOS  --   --   --  2.7  --    GFR: Estimated Creatinine Clearance: 73.6 mL/min (by C-G formula based on SCr of 1.2 mg/dL). Liver Function Tests: Recent Labs  Lab 03/20/19 0756  ALBUMIN 3.2*   No results for input(s): LIPASE, AMYLASE in the last 168 hours. No results for input(s): AMMONIA in the last 168 hours. Coagulation Profile: No results for input(s): INR, PROTIME in the last 168 hours. Cardiac Enzymes: No results for input(s): CKTOTAL, CKMB, CKMBINDEX, TROPONINI in the last 168 hours. BNP (last 3 results) No results for input(s): PROBNP in the last 8760 hours. HbA1C: Recent Labs    03/18/19 1413  HGBA1C 10.0*   CBG: Recent Labs  Lab 03/20/19 2114 03/21/19 0307 03/21/19 0608 03/21/19 0817 03/21/19 1211  GLUCAP 115* 423* 312* 278* 267*   Lipid Profile: No results for input(s): CHOL, HDL, LDLCALC, TRIG, CHOLHDL, LDLDIRECT in the last 72 hours. Thyroid Function Tests: No results for input(s): TSH, T4TOTAL, FREET4, T3FREE, THYROIDAB in the last 72 hours. Anemia Panel: No results for input(s): VITAMINB12, FOLATE, FERRITIN, TIBC, IRON, RETICCTPCT in the last 72 hours. Urine analysis:    Component Value  Date/Time   COLORURINE YELLOW 12/31/2016 1505   APPEARANCEUR HAZY (A) 12/31/2016 1505   LABSPEC 1.014 12/31/2016 1505   PHURINE 6.0 12/31/2016 1505   GLUCOSEU >=500 (A) 12/31/2016 1505   HGBUR SMALL (A) 12/31/2016 1505   BILIRUBINUR NEGATIVE 12/31/2016 1505   BILIRUBINUR Negative 12/13/2014 1510   KETONESUR NEGATIVE 12/31/2016 1505   PROTEINUR 100 (A) 12/31/2016 1505   UROBILINOGEN 0.2 12/13/2014 1510   UROBILINOGEN 1.0 10/31/2007 1621   NITRITE NEGATIVE 12/31/2016 1505   LEUKOCYTESUR NEGATIVE 12/31/2016 1505   Sepsis Labs: @LABRCNTIP (procalcitonin:4,lacticidven:4)  ) Recent Results (from the past 240 hour(s))  SARS CORONAVIRUS 2 (TAT 6-24 HRS) Nasopharyngeal Nasopharyngeal Swab     Status: None   Collection Time: 03/18/19 11:07 PM   Specimen: Nasopharyngeal Swab  Result Value Ref Range Status   SARS Coronavirus 2 NEGATIVE NEGATIVE Final    Comment: (NOTE) SARS-CoV-2 target nucleic acids are NOT DETECTED. The SARS-CoV-2 RNA is generally detectable in upper and lower respiratory specimens during the acute phase of infection. Negative results do not preclude SARS-CoV-2 infection, do not rule out co-infections with other pathogens, and  should not be used as the sole basis for treatment or other patient management decisions. Negative results must be combined with clinical observations, patient history, and epidemiological information. The expected result is Negative. Fact Sheet for Patients: SugarRoll.be Fact Sheet for Healthcare Providers: https://www.woods-mathews.com/ This test is not yet approved or cleared by the [place] FDA and  has been authorized for detection and/or diagnosis of SARS-CoV-2 by FDA under an Emergency Use Authorization (EUA). This EUA will remain  in effect (meaning this test can be used) for the duration of the COVID-19 declaration under Section 56 4(b)(1) of the Act, 21 U.S.C. section 360bbb-3(b)(1),  unless the authorization is terminated or revoked sooner. Performed at [facility], [place] [address], [place], [place] [address]        Radiology Studies: No results found.     Scheduled Meds: . enoxaparin (LOVENOX) injection  40 mg Subcutaneous Q24H  . ferrous sulfate  325 mg Oral BID WC  . gabapentin  600 mg Oral TID  . insulin aspart  0-15 Units Subcutaneous TID WC  . insulin glargine  40 Units Subcutaneous q morning - 10a  . lisinopril  2.5 mg Oral Daily   Continuous Infusions:    LOS: 2 days   Oscar La, MD [facility]

## 2019-03-21 NOTE — Progress Notes (Signed)
Patient felt his blood sugar was high. Patient care tech checked blood glucose which resulted in 424. This nurse paged Dr. Hilbert Bible

## 2019-03-21 NOTE — Progress Notes (Signed)
Paged Dr Hilbert Bible a second time in regards to patients hyperglycemic blood glucose.  This nurse has not heard back nor orders were put in so reached out to Dr. Baltazar Najjar in regards to patients high blood glucose.

## 2019-03-22 LAB — GLUCOSE, CAPILLARY
Glucose-Capillary: 332 mg/dL — ABNORMAL HIGH (ref 70–99)
Glucose-Capillary: 340 mg/dL — ABNORMAL HIGH (ref 70–99)
Glucose-Capillary: 64 mg/dL — ABNORMAL LOW (ref 70–99)
Glucose-Capillary: 141 mg/dL — ABNORMAL HIGH (ref 70–99)
Glucose-Capillary: 144 mg/dL — ABNORMAL HIGH (ref 70–99)
Glucose-Capillary: 245 mg/dL — ABNORMAL HIGH (ref 70–99)
Glucose-Capillary: 60 mg/dL — ABNORMAL LOW (ref 70–99)
Glucose-Capillary: 88 mg/dL (ref 70–99)

## 2019-03-22 LAB — CBC
HCT: 27.6 % — ABNORMAL LOW (ref 39.0–52.0)
Hemoglobin: 8.9 g/dL — ABNORMAL LOW (ref 13.0–17.0)
MCH: 26.7 pg (ref 26.0–34.0)
MCHC: 32.2 g/dL (ref 30.0–36.0)
MCV: 82.9 fL (ref 80.0–100.0)
Platelets: 377 10*3/uL (ref 150–400)
RBC: 3.33 MIL/uL — ABNORMAL LOW (ref 4.22–5.81)
RDW: 17.6 % — ABNORMAL HIGH (ref 11.5–15.5)
WBC: 4.4 10*3/uL (ref 4.0–10.5)
nRBC: 0 % (ref 0.0–0.2)

## 2019-03-22 LAB — BASIC METABOLIC PANEL
Anion gap: 9 (ref 5–15)
BUN: 23 mg/dL — ABNORMAL HIGH (ref 6–20)
CO2: 23 mmol/L (ref 22–32)
Calcium: 9 mg/dL (ref 8.9–10.3)
Chloride: 101 mmol/L (ref 98–111)
Creatinine, Ser: 1.31 mg/dL — ABNORMAL HIGH (ref 0.61–1.24)
GFR calc Af Amer: >60 mL/min (ref >60)
GFR calc non Af Amer: >60 mL/min (ref >60)
Glucose, Bld: 368 mg/dL — ABNORMAL HIGH (ref 70–99)
Potassium: 4.7 mmol/L (ref 3.5–5.1)
Sodium: 133 mmol/L — ABNORMAL LOW (ref 135–145)

## 2019-03-22 MED ORDER — INFLUENZA VAC SPLIT QUAD 0.5 ML IM SUSY: 0.5000 mL | PREFILLED_SYRINGE | INTRAMUSCULAR | Status: DC

## 2019-03-22 MED ORDER — INSULIN GLARGINE 100 UNIT/ML ~~LOC~~ SOLN
52.0000 [IU] | Freq: Every morning | SUBCUTANEOUS | Status: DC
  Administered 2019-03-22: 52 [IU] via SUBCUTANEOUS
  Filled 2019-03-22 (×2): qty 0.52

## 2019-03-22 NOTE — Progress Notes (Signed)
Patient felt blood sugar was elevated and requested blood glucose be checked. At 0353 blood sugar 332. This nurse paged Dr Hilbert Bible

## 2019-03-22 NOTE — TOC Progression Note (Signed)
Transition of Care (TOC) - Progression Note    Patient Details  Name: Trexton Escamilla. [place] MRN: [id number] Date of Birth: August 20, 1965  Transition of Care [facility]) CM/SW [place], [place] Phone Number: 03/22/2019, 10:17 AM  Clinical Narrative:    Referrals made again to [facility] for voluntary admission.    Expected Discharge Plan: Psychiatric Hospital Barriers to Discharge: Psych Bed not available, Continued Medical Work up  Expected Discharge Plan and Services Expected Discharge Plan: [facility] In-house Referral: Clinical Social Work Discharge Planning Services: CM Consult Post Acute Care Choice: NA Living arrangements for the past 2 months: No permanent address   Social Determinants of Health (SDOH) Interventions    Readmission Risk Interventions Readmission Risk Prevention Plan 03/21/2019  Post Dischage Appt Not Complete  Appt Comments plan for [facility] placement.  Medication Screening Complete  Transportation Screening Complete  Some recent data might be hidden

## 2019-03-22 NOTE — Progress Notes (Signed)
PROGRESS NOTE    Cody Campbell. Cody Campbell  WIO:[id number] DOB: 1966-03-13 DOA: 03/18/2019 PCP: Marcine Matar, MD   Brief Narrative: Patient is a 53 year old African-American male, recently homeless, with past medical history significant for, hyperlipidemia, abnormal LFTs and diabetes mellitus was admitted to the hospital with orthostatic hypotension, acute kidney injury and volume depletion.  Hypoglycemia was noted during the hospital stay and insulin dose was adjusted.  During hospitalization, patient had feelings of depression and suicidal ideation and psychiatry was consulted and recommended in-house psychiatric placement  Assessment & Plan:   Principal Problem:   Orthostatic hypotension Active Problems:   DM (diabetes mellitus), type 2, uncontrolled (HCC)   Hypertension   Depression   AKI (acute kidney injury) (HCC)  Orthostatic hypotension: Improved.  Likely secondary to volume depletion and diabetic autonomic neuropathy.  Patient received IV fluids and hydration with improvement in his symptoms but he still feels a little dizzy on standing.  Patient was advised orthostatic precautions and adequate hydration.  Suicidal ideation with depression, manic depressive illness.  Expressed suicidal ideation twice during this hospitalization in the morning.   psychiatry recommends inpatient psych admission when medically clear.  Acute kidney injury: Suspect secondary to volume depletion-improved-Creat of 1.,3 closer to baseline of 1./2  Diabetes mellitus, uncontrolled: Patient states that he has brittle diabetes.  Was initially hypoglycemic and Lantus was at home Changing to Lantus 52 U today-monitor trends--has tendency to being brittle in terms of control  Accelerated hypertension: Today's blood pressure is accelerated.  Discontinue normal saline.  Lisinopril d/c.  Continue hydralazine PRN > 170 sys  Homelessness: Pharmacist, community. Need input re: [place] [facility]    DVT prophylaxis: Lovenox  Code Status: Full code  Family Communication:   None  Disposition Plan: Patient is homeless.  Psychiatry recommends inpatient psych admission.  We will try to optimize her blood glucose levels and blood pressure today.  Likely stable by tomorrow for psychiatry facility admission.   Consultants:   Psychiatry  Procedures:   None  Antimicrobials:   None   Subjective: Patient is slightly irritable today.  He still feels little dizzy on standing up.  Objective: Vitals:   03/21/19 1730 03/21/19 2151 03/22/19 0554 03/22/19 0815  BP: 99/75 (!) 168/100 (!) 183/98 (!) 182/71  Pulse: 92 72 76 91  Resp: 18 14 18    Temp: 97.8 F (36.6 C) 98.5 F (36.9 C) 98.4 F (36.9 C)   TempSrc: Oral Oral Oral   SpO2:  100% 100%   Weight:      Height:        Intake/Output Summary (Last 24 hours) at 03/22/2019 0943 Last data filed at 03/22/2019 0730 Gross per 24 hour  Intake 600 ml  Output -  Net 600 ml   Filed Weights   03/18/19 1309  Weight: 74.8 kg    Examination:  Coherent pleasant in nad Slight build no ic tno pallor Neck soft s1 s2 no m/r/g abd soft nt nd no rebound no gaurd Poor cold touch sensatioin to LE's Neuro intact   Data Reviewed: I have personally reviewed following labs and imaging studies  CBC: Recent Labs  Lab 03/18/19 1403 03/20/19 0756 03/21/19 0252 03/22/19 0146  WBC 6.9 4.3 4.2 4.4  NEUTROABS 4.7 1.9  --   --   HGB 10.0* 9.2* 8.9* 8.9*  HCT 31.6* 28.1* 27.6* 27.6*  MCV 83.4 82.6 83.1 82.9  PLT 408* 370 372 377   Basic Metabolic Panel: Recent Labs  Lab 03/18/19 1403 03/18/19 1815 03/19/19 0317 03/20/19 0756 03/21/19 0252 03/22/19 0146  NA 132* 130* 131* 134* 135 133*  K 3.9 4.4 4.9 4.2 5.0 4.7  CL 97* 98 100 104 103 101  CO2 21* 20* 20* 22 24 23   GLUCOSE 83 378* 531* 158* 387* 368*  BUN 34* 30* 23* 12 17 23*  CREATININE 1.84* 1.65* 1.42* 1.12 1.20 1.31*  CALCIUM 9.5 8.6* 8.3* 8.5* 8.8* 9.0  MG 2.2   --   --  2.0 1.8  --   PHOS  --   --   --  2.7  --   --    GFR: Estimated Creatinine Clearance: 67.4 mL/min (A) (by C-G formula based on SCr of 1.31 mg/dL (H)). Liver Function Tests: Recent Labs  Lab 03/20/19 0756  ALBUMIN 3.2*   No results for input(s): LIPASE, AMYLASE in the last 168 hours. No results for input(s): AMMONIA in the last 168 hours. Coagulation Profile: No results for input(s): INR, PROTIME in the last 168 hours. Cardiac Enzymes: No results for input(s): CKTOTAL, CKMB, CKMBINDEX, TROPONINI in the last 168 hours. BNP (last 3 results) No results for input(s): PROBNP in the last 8760 hours. HbA1C: No results for input(s): HGBA1C in the last 72 hours. CBG: Recent Labs  Lab 03/21/19 1211 03/21/19 1659 03/21/19 2037 03/22/19 0353 03/22/19 0810  GLUCAP 267* 137* 257* 332* 340*   Lipid Profile: No results for input(s): CHOL, HDL, LDLCALC, TRIG, CHOLHDL, LDLDIRECT in the last 72 hours. Thyroid Function Tests: No results for input(s): TSH, T4TOTAL, FREET4, T3FREE, THYROIDAB in the last 72 hours. Anemia Panel: No results for input(s): VITAMINB12, FOLATE, FERRITIN, TIBC, IRON, RETICCTPCT in the last 72 hours. Urine analysis:    Component Value Date/Time   COLORURINE YELLOW 12/31/2016 1505   APPEARANCEUR HAZY (A) 12/31/2016 1505   LABSPEC 1.014 12/31/2016 1505   PHURINE 6.0 12/31/2016 1505   GLUCOSEU >=500 (A) 12/31/2016 1505   HGBUR SMALL (A) 12/31/2016 1505   BILIRUBINUR NEGATIVE 12/31/2016 1505   BILIRUBINUR Negative 12/13/2014 1510   KETONESUR NEGATIVE 12/31/2016 1505   PROTEINUR 100 (A) 12/31/2016 1505   UROBILINOGEN 0.2 12/13/2014 1510   UROBILINOGEN 1.0 10/31/2007 1621   NITRITE NEGATIVE 12/31/2016 1505   LEUKOCYTESUR NEGATIVE 12/31/2016 1505   Sepsis Labs: @LABRCNTIP (procalcitonin:4,lacticidven:4)  ) Recent Results (from the past 240 hour(s))  SARS CORONAVIRUS 2 (TAT 6-24 HRS) Nasopharyngeal Nasopharyngeal Swab     Status: None   Collection  Time: 03/18/19 11:07 PM   Specimen: Nasopharyngeal Swab  Result Value Ref Range Status   SARS Coronavirus 2 NEGATIVE NEGATIVE Final    Comment: (NOTE) SARS-CoV-2 target nucleic acids are NOT DETECTED. The SARS-CoV-2 RNA is generally detectable in upper and lower respiratory specimens during the acute phase of infection. Negative results do not preclude SARS-CoV-2 infection, do not rule out co-infections with other pathogens, and should not be used as the sole basis for treatment or other patient management decisions. Negative results must be combined with clinical observations, patient history, and epidemiological information. The expected result is Negative. Fact Sheet for Patients: HairSlick.nohttps://www.fda.gov/media/138098/download Fact Sheet for Healthcare Providers: quierodirigir.comhttps://www.fda.gov/media/138095/download This test is not yet approved or cleared by the [place] FDA and  has been authorized for detection and/or diagnosis of SARS-CoV-2 by FDA under an Emergency Use Authorization (EUA). This EUA will remain  in effect (meaning this test can be used) for the duration of the COVID-19 declaration under Section 56 4(b)(1) of the Act, 21 U.S.C. section 360bbb-3(b)(1), unless the  authorization is terminated or revoked sooner. Performed at [facility], [place] [address] [place], [place] [address]        Radiology Studies: No results found.     Scheduled Meds: . enoxaparin (LOVENOX) injection  40 mg Subcutaneous Q24H  . ferrous sulfate  325 mg Oral BID WC  . gabapentin  600 mg Oral TID  . insulin aspart  0-15 Units Subcutaneous TID WC  . insulin glargine  52 Units Subcutaneous q morning - 10a   Continuous Infusions:    LOS: 3 days   Oscar La, MD [facility]

## 2019-03-22 NOTE — [place] Notes (Signed)
Inpatient Diabetes Program Recommendations  AACE/ADA: New Consensus Statement on Inpatient Glycemic Control (2015)  Target Ranges:  Prepandial:   less than 140 mg/dL      Peak postprandial:   less than 180 mg/dL (1-2 hours)      Critically ill patients:  140 - 180 mg/dL   Lab Results  Component Value Date   GLUCAP 340 (H) 03/22/2019   HGBA1C 10.0 (H) 03/18/2019    Review of Glycemic Control  Diabetes history: DM2 Outpatient Diabetes medications: Lantus 35 units QD, Novolog 6 units tidwc Current orders for Inpatient glycemic control: Lantus 51 units QD, Novolog 0-15 units tidwc  HgbA1C - 10.0% - uncontrolled Post-prandial blood sugars elevated.  Would benefit from addition of meal coverage insulin  Inpatient Diabetes Program Recommendations:     Add Novolog 5 units tidwc for meal coverage insulin if pt eats > 50% meal.  Secure text to MD.  Thank you. Lorenda Peck, RD, LDN, CDE Inpatient Diabetes Coordinator [phone]

## 2019-03-23 ENCOUNTER — Inpatient Hospital Stay
Admission: RE | Admit: 2019-03-23 | Discharge: 2019-04-07 | DRG: 882 | Disposition: A | Discharge status: Home / Self Care | Payer: Self-pay | Source: Intra-hospital | Attending: Psychiatry | Admitting: Psychiatry

## 2019-03-23 ENCOUNTER — Other Ambulatory Visit: Payer: Self-pay

## 2019-03-23 DIAGNOSIS — F4323 Adjustment disorder with mixed anxiety and depressed mood: Principal | ICD-10-CM

## 2019-03-23 DIAGNOSIS — E1140 Type 2 diabetes mellitus with diabetic neuropathy, unspecified: Secondary | ICD-10-CM | POA: Diagnosis present

## 2019-03-23 DIAGNOSIS — F322 Major depressive disorder, single episode, severe without psychotic features: Secondary | ICD-10-CM | POA: Insufficient documentation

## 2019-03-23 DIAGNOSIS — I10 Essential (primary) hypertension: Secondary | ICD-10-CM | POA: Diagnosis present

## 2019-03-23 DIAGNOSIS — Z590 Homelessness: Secondary | ICD-10-CM

## 2019-03-23 DIAGNOSIS — G4700 Insomnia, unspecified: Secondary | ICD-10-CM | POA: Diagnosis present

## 2019-03-23 DIAGNOSIS — E1165 Type 2 diabetes mellitus with hyperglycemia: Secondary | ICD-10-CM | POA: Diagnosis present

## 2019-03-23 DIAGNOSIS — IMO0002 Reserved for concepts with insufficient information to code with codable children: Secondary | ICD-10-CM | POA: Diagnosis present

## 2019-03-23 DIAGNOSIS — R45851 Suicidal ideations: Secondary | ICD-10-CM | POA: Diagnosis present

## 2019-03-23 DIAGNOSIS — F17290 Nicotine dependence, other tobacco product, uncomplicated: Secondary | ICD-10-CM | POA: Diagnosis present

## 2019-03-23 LAB — GLUCOSE, CAPILLARY
Glucose-Capillary: 92 mg/dL (ref 70–99)
Glucose-Capillary: 110 mg/dL — ABNORMAL HIGH (ref 70–99)
Glucose-Capillary: 182 mg/dL — ABNORMAL HIGH (ref 70–99)
Glucose-Capillary: 25 mg/dL — CL (ref 70–99)
Glucose-Capillary: 50 mg/dL — ABNORMAL LOW (ref 70–99)
Glucose-Capillary: 78 mg/dL (ref 70–99)
Glucose-Capillary: 193 mg/dL — ABNORMAL HIGH (ref 70–99)

## 2019-03-23 LAB — RENAL FUNCTION PANEL
Albumin: 3.1 g/dL — ABNORMAL LOW (ref 3.5–5.0)
Anion gap: 9 (ref 5–15)
BUN: 22 mg/dL — ABNORMAL HIGH (ref 6–20)
CO2: 25 mmol/L (ref 22–32)
Calcium: 8.8 mg/dL — ABNORMAL LOW (ref 8.9–10.3)
Chloride: 101 mmol/L (ref 98–111)
Creatinine, Ser: 1.3 mg/dL — ABNORMAL HIGH (ref 0.61–1.24)
GFR calc Af Amer: >60 mL/min (ref >60)
GFR calc non Af Amer: >60 mL/min (ref >60)
Glucose, Bld: 234 mg/dL — ABNORMAL HIGH (ref 70–99)
Phosphorus: 4.8 mg/dL — ABNORMAL HIGH (ref 2.5–4.6)
Potassium: 4.6 mmol/L (ref 3.5–5.1)
Sodium: 135 mmol/L (ref 135–145)

## 2019-03-23 LAB — CBC WITH DIFFERENTIAL/PLATELET
Abs Immature Granulocytes: 0 10*3/uL (ref 0.00–0.07)
Basophils Absolute: 0.1 10*3/uL (ref 0.0–0.1)
Basophils Relative: 1 %
Eosinophils Absolute: 0.5 10*3/uL (ref 0.0–0.5)
Eosinophils Relative: 9 %
HCT: 28.1 % — ABNORMAL LOW (ref 39.0–52.0)
Hemoglobin: 8.9 g/dL — ABNORMAL LOW (ref 13.0–17.0)
Immature Granulocytes: 0 %
Lymphocytes Relative: 29 %
Lymphs Abs: 1.5 10*3/uL (ref 0.7–4.0)
MCH: 26.7 pg (ref 26.0–34.0)
MCHC: 31.7 g/dL (ref 30.0–36.0)
MCV: 84.4 fL (ref 80.0–100.0)
Monocytes Absolute: 0.7 10*3/uL (ref 0.1–1.0)
Monocytes Relative: 14 %
Neutro Abs: 2.5 10*3/uL (ref 1.7–7.7)
Neutrophils Relative %: 47 %
Platelets: 422 10*3/uL — ABNORMAL HIGH (ref 150–400)
RBC: 3.33 MIL/uL — ABNORMAL LOW (ref 4.22–5.81)
RDW: 17.9 % — ABNORMAL HIGH (ref 11.5–15.5)
WBC: 5.2 10*3/uL (ref 4.0–10.5)
nRBC: 0 % (ref 0.0–0.2)

## 2019-03-23 MED ORDER — DEXTROSE 50 % IV SOLN: 1.0000 | Freq: Once | INTRAVENOUS | Status: DC

## 2019-03-23 MED ORDER — GLUCAGON HCL RDNA (DIAGNOSTIC) 1 MG IJ SOLR: 1.0000 mg | INTRAMUSCULAR | Status: DC

## 2019-03-23 MED ORDER — INSULIN GLARGINE 100 UNIT/ML ~~LOC~~ SOLN
45.0000 [IU] | Freq: Every morning | SUBCUTANEOUS | Status: DC
  Administered 2019-03-23: 45 [IU] via SUBCUTANEOUS
  Filled 2019-03-23: qty 0.45

## 2019-03-23 MED ORDER — HYDROXYZINE HCL 25 MG PO TABS
25.0000 mg | ORAL_TABLET | Freq: Four times a day (QID) | ORAL | Status: DC | PRN
  Administered 2019-04-01: 25 mg via ORAL
  Filled 2019-03-23 (×3): qty 1

## 2019-03-23 MED ORDER — SERTRALINE HCL 50 MG PO TABS: 50.0000 mg | ORAL_TABLET | Freq: Every day | ORAL | 0 refills | Status: DC

## 2019-03-23 MED ORDER — OXYMETAZOLINE HCL 0.05 % NA SOLN: 1.0000 | Freq: Two times a day (BID) | NASAL | Status: DC

## 2019-03-23 MED ORDER — INSULIN GLARGINE 100 UNIT/ML ~~LOC~~ SOLN: 35.0000 [IU] | Freq: Once | SUBCUTANEOUS | Status: DC

## 2019-03-23 MED ORDER — INSULIN ASPART 100 UNIT/ML ~~LOC~~ SOLN
0.0000 [IU] | SUBCUTANEOUS | Status: DC
  Administered 2019-03-24: 8 [IU] via SUBCUTANEOUS
  Administered 2019-03-24: 11 [IU] via SUBCUTANEOUS
  Administered 2019-03-25: 3 [IU] via SUBCUTANEOUS
  Administered 2019-03-25: 11 [IU] via SUBCUTANEOUS
  Filled 2019-03-23 (×3): qty 1

## 2019-03-23 MED ORDER — GLUCOSE 4 G PO CHEW: 1.0000 | CHEWABLE_TABLET | Freq: Once | ORAL | Status: AC

## 2019-03-23 MED ORDER — MAGNESIUM HYDROXIDE 400 MG/5ML PO SUSP: 30.0000 mL | Freq: Every day | ORAL | Status: DC | PRN

## 2019-03-23 MED ORDER — ALUM & MAG HYDROXIDE-SIMETH 200-200-20 MG/5ML PO SUSP
30.0000 mL | ORAL | Status: DC | PRN
  Administered 2019-03-27 – 2019-03-28 (×2): 30 mL via ORAL
  Filled 2019-03-23 (×2): qty 30

## 2019-03-23 MED ORDER — HYDRALAZINE HCL 25 MG PO TABS: 25.0000 mg | ORAL_TABLET | Freq: Four times a day (QID) | ORAL | 0 refills | Status: DC | PRN

## 2019-03-23 MED ORDER — SERTRALINE HCL 50 MG PO TABS
50.0000 mg | ORAL_TABLET | Freq: Every day | ORAL | Status: DC
  Administered 2019-03-23: 50 mg via ORAL
  Filled 2019-03-23: qty 1

## 2019-03-23 MED ORDER — GLUCOSE 4 G PO CHEW
CHEWABLE_TABLET | ORAL | Status: AC
  Filled 2019-03-23: qty 1

## 2019-03-23 NOTE — Plan of Care (Signed)
  Problem: Activity: Goal: Risk for activity intolerance will decrease Outcome: Progressing   Problem: Nutrition: Goal: Adequate nutrition will be maintained Outcome: Progressing   Problem: Coping: Goal: Level of anxiety will decrease Outcome: Progressing   Problem: Pain Managment: Goal: General experience of comfort will improve Outcome: Progressing   Problem: Safety: Goal: Ability to remain free from injury will improve Outcome: Progressing   

## 2019-03-23 NOTE — Progress Notes (Signed)
PROGRESS NOTE    Cody Campbell  ZOX:[id number] DOB: 01/06/1966 DOA: 03/18/2019 PCP: Marcine MatarJohnson, Deborah B, MD   Brief Narrative: Patient is a 53 year old African-American male, recently homeless, with past medical history significant for, hyperlipidemia, abnormal LFTs and diabetes mellitus was admitted to the hospital with orthostatic hypotension, acute kidney injury and volume depletion.  Hypoglycemia was noted during the hospital stay and insulin dose was adjusted.  During hospitalization, patient had feelings of depression and suicidal ideation and psychiatry was consulted and recommended in-house psychiatric placement  Assessment & Plan:   Principal Problem:   Orthostatic hypotension Active Problems:   DM (diabetes mellitus), type 2, uncontrolled (HCC)   Hypertension   Depression   AKI (acute kidney injury) (HCC)  Orthostatic hypotension: Improved.  Likely secondary to volume depletion and diabetic autonomic neuropathy.  IV repleted some improvement in symptoms  Patient was advised orthostatic precautions and adequate hydration.  Offered him midodrine but he prefers to weigh the risks and benefits of not taking this with elevated blood pressure and what this could cause in terms of morbidity--he has been ambulating to some degree in the hallways  Suicidal ideation with depression, manic depressive illness.  Expressed suicidal ideation twice during this hospitalization in the morning.   Patient is medically cleared for discharge and can discharge anytime  Acute kidney injury: Suspect secondary to volume depletion-improved-Creat of 1.,3 closer to baseline of 1./2  Diabetes mellitus, uncontrolled: Patient states that he has brittle diabetes.  Hypoglycemic on 10/21 cutting back Lantus to 45 units  Accelerated hypertension: Today's blood pressure is accelerated.  Discontinue normal saline.  Lisinopril d/c.  Continue hydralazine PRN > 170 sys  Homelessness: Pharmacist, communityocial worker on board. Need  input re: [place]   DVT prophylaxis: Lovenox  Code Status: Full code  Family Communication:   None  Disposition Plan: Patient is homeless.  Psychiatry recommends inpatient psych admission.  Patient is stabilized for discharge whenever   Consultants:   Psychiatry  Procedures:   None  Antimicrobials:   None   Subjective: Awake coherent no distress eating drinking Blood sugars were low yesterday the 16th with increasing Lantus dose and he felt cold and clammy when that occurred  Objective: Vitals:   03/22/19 2205 03/22/19 2207 03/22/19 2209 03/23/19 0620  BP: 135/79   (!) 132/92  Pulse: 82   (!) 101  Resp: 13   18  Temp: 98.7 F (37.1 C)   98.8 F (37.1 C)  TempSrc: Oral   Oral  SpO2: 100% 100% 100% 100%  Weight:      Height:       No intake or output data in the 24 hours ending 03/23/19 0834 Filed Weights   03/18/19 1309  Weight: 74.8 kg    Examination:  Coherent no distress Neck soft supple no thyromegaly EOMI NCAT s1 s2 no m/r/g abd soft nt nd no rebound no gaurd Poor cold touch sensatioin to LE's Neuro intact   Data Reviewed: I have personally reviewed following labs and imaging studies  CBC: Recent Labs  Lab 03/18/19 1403 03/20/19 0756 03/21/19 0252 03/22/19 0146 03/23/19 0142  WBC 6.9 4.3 4.2 4.4 5.2  NEUTROABS 4.7 1.9  --   --  2.5  HGB 10.0* 9.2* 8.9* 8.9* 8.9*  HCT 31.6* 28.1* 27.6* 27.6* 28.1*  MCV 83.4 82.6 83.1 82.9 84.4  PLT 408* 370 372 377 422*   Basic Metabolic Panel: Recent Labs  Lab 03/18/19 1403  03/19/19 0317 03/20/19 0756 03/21/19  1448 03/22/19 0146 03/23/19 0142  NA 132*   < > 131* 134* 135 133* 135  K 3.9   < > 4.9 4.2 5.0 4.7 4.6  CL 97*   < > 100 104 103 101 101  CO2 21*   < > 20* 22 24 23 25   GLUCOSE 83   < > 531* 158* 387* 368* 234*  BUN 34*   < > 23* 12 17 23* 22*  CREATININE 1.84*   < > 1.42* 1.12 1.20 1.31* 1.30*  CALCIUM 9.5   < > 8.3* 8.5* 8.8* 9.0 8.8*  MG 2.2  --    --  2.0 1.8  --   --   PHOS  --   --   --  2.7  --   --  4.8*   < > = values in this interval not displayed.   GFR: Estimated Creatinine Clearance: 67.9 mL/min (A) (by C-G formula based on SCr of 1.3 mg/dL (H)). Liver Function Tests: Recent Labs  Lab 03/20/19 0756 03/23/19 0142  ALBUMIN 3.2* 3.1*   No results for input(s): LIPASE, AMYLASE in the last 168 hours. No results for input(s): AMMONIA in the last 168 hours. Coagulation Profile: No results for input(s): INR, PROTIME in the last 168 hours. Cardiac Enzymes: No results for input(s): CKTOTAL, CKMB, CKMBINDEX, TROPONINI in the last 168 hours. BNP (last 3 results) No results for input(s): PROBNP in the last 8760 hours. HbA1C: No results for input(s): HGBA1C in the last 72 hours. CBG: Recent Labs  Lab 03/22/19 1239 03/22/19 1402 03/22/19 1750 03/22/19 2034 03/22/19 2222  GLUCAP 141* 144* 245* 60* 88   Lipid Profile: No results for input(s): CHOL, HDL, LDLCALC, TRIG, CHOLHDL, LDLDIRECT in the last 72 hours. Thyroid Function Tests: No results for input(s): TSH, T4TOTAL, FREET4, T3FREE, THYROIDAB in the last 72 hours. Anemia Panel: No results for input(s): VITAMINB12, FOLATE, FERRITIN, TIBC, IRON, RETICCTPCT in the last 72 hours. Urine analysis:    Component Value Date/Time   COLORURINE YELLOW 12/31/2016 1505   APPEARANCEUR HAZY (A) 12/31/2016 1505   LABSPEC 1.014 12/31/2016 1505   PHURINE 6.0 12/31/2016 1505   GLUCOSEU >=500 (A) 12/31/2016 1505   HGBUR SMALL (A) 12/31/2016 1505   BILIRUBINUR NEGATIVE 12/31/2016 1505   BILIRUBINUR Negative 12/13/2014 1510   KETONESUR NEGATIVE 12/31/2016 1505   PROTEINUR 100 (A) 12/31/2016 1505   UROBILINOGEN 0.2 12/13/2014 1510   UROBILINOGEN 1.0 10/31/2007 1621   NITRITE NEGATIVE 12/31/2016 1505   LEUKOCYTESUR NEGATIVE 12/31/2016 1505   Sepsis Labs: @LABRCNTIP (procalcitonin:4,lacticidven:4)  ) Recent Results (from the past 240 hour(s))  SARS CORONAVIRUS 2 (TAT 6-24 HRS)  Nasopharyngeal Nasopharyngeal Swab     Status: None   Collection Time: 03/18/19 11:07 PM   Specimen: Nasopharyngeal Swab  Result Value Ref Range Status   SARS Coronavirus 2 NEGATIVE NEGATIVE Final    Comment: (NOTE) SARS-CoV-2 target nucleic acids are NOT DETECTED. The SARS-CoV-2 RNA is generally detectable in upper and lower respiratory specimens during the acute phase of infection. Negative results do not preclude SARS-CoV-2 infection, do not rule out co-infections with other pathogens, and should not be used as the sole basis for treatment or other patient management decisions. Negative results must be combined with clinical observations, patient history, and epidemiological information. The expected result is Negative. Fact Sheet for Patients: Fact Sheet for Healthcare Providers: 03/20/19 This test is not yet approved or cleared by the [place] FDA and  has been authorized for detection and/or diagnosis of  SARS-CoV-2 by FDA under an Emergency Use Authorization (EUA). This EUA will remain  in effect (meaning this test can be used) for the duration of the COVID-19 declaration under Section 56 4(b)(1) of the Act, 21 U.S.C. section 360bbb-3(b)(1), unless the authorization is terminated or revoked sooner. Performed at [facility], [place] [address] [place], [place] [address]        Radiology Studies: No results found.     Scheduled Meds:  enoxaparin (LOVENOX) injection  40 mg Subcutaneous Q24H   ferrous sulfate  325 mg Oral BID WC   gabapentin  600 mg Oral TID   influenza vac split quadrivalent PF  0.5 mL Intramuscular Tomorrow-1000   insulin aspart  0-15 Units Subcutaneous TID WC   insulin glargine  52 Units Subcutaneous q morning - 10a   Continuous Infusions:    LOS: 4 days   Oscar La, MD [facility]

## 2019-03-23 NOTE — Progress Notes (Signed)
Report called to Harmon Dun at [facility]. Transport set up through [facility].

## 2019-03-23 NOTE — Progress Notes (Signed)
[place] NP notified of CBG, pt refused glucose tablet. He states that all that he has dranked his sugar will get to high. Told told DC pt.Cody Campbell

## 2019-03-23 NOTE — Discharge Summary (Signed)
Physician Discharge Summary  Cody Campbell. [name]:[id number] DOB: [birth year] DOA: 03/18/2019  PCP: Ladell Pier, MD  Admit date: 03/18/2019 Discharge date: 03/23/2019  Time spent: 22 minutes  Recommendations for Outpatient Follow-up:  1. Ensure that patient has access to lactulose fluids as well need oral fluids to maintain his blood pressures given his severe orthostasis 2. Needs Chem-12, CBC 1 week 3. Very brittle diabetic monitor sugars carefully  Discharge Diagnoses:  Principal Problem:   Orthostatic hypotension Active Problems:   DM (diabetes mellitus), type 2, uncontrolled (HCC)   Hypertension   Depression   AKI (acute kidney injury) [facility])   Discharge Condition: Improved  Diet recommendation: Diabetic  Filed Weights   03/18/19 1309  Weight: 74.8 kg    History of present illness:  a 53 year old African-American male, recently homeless, with past medical history significant for, hyperlipidemia, abnormal LFTs and diabetes mellitus was admitted to the hospital with orthostatic hypotension, acute kidney injury and volume depletion.  Hypoglycemia was noted during the hospital stay and insulin dose was adjusted.  During hospitalization, patient had feelings of depression and suicidal ideation and psychiatry was consulted and recommended in-house psychiatric placement  Hospital Course:  Orthostatic hypotension: Improved.  Likely secondary to volume depletion and diabetic autonomic neuropathy.  IV repleted-declined the use of midodrine-orthostatics still present but he is able to self manage-I have counseled him regarding risk benefits and he is agreeable to drinking large amounts of water to keep himself clear  Suicidal ideation with depression, manic depressive illness.  Expressed suicidal ideation twice during this hospitalization in the morning.    Patient is medically cleared and was recommended to continue Zoloft as per psych input on 10/22  Acute kidney  injury: Suspect secondary to volume depletion-improved-Creat of 1.,3 closer to baseline of 1./2  Diabetes mellitus, uncontrolled: Patient states that he has brittle diabetes.  Hypoglycemic on 10/21 cutting back Lantus to usual home dosing of 40  Accelerated hypertension: Today's blood pressure is accelerated.  Discontinue normal saline.  Lisinopril d/c.  Continue hydralazine PRN > 170 sys  Homelessness: Interior and spatial designer.  He will be going to a psych facility on discharge and has been stabilized   Procedures:  None   Consultations:  Psychiatry  Discharge Exam: Vitals:   03/23/19 1117 03/23/19 1123  BP: (!) 141/91 (!) 81/63  Pulse: 97 96  Resp: 18 18  Temp: 98.6 F (37 C) 98.8 F (37.1 C)  SpO2: 100% 100%    General: Awake coherent no distress EOMI NCAT Cardiovascular: S1-S2 no murmur rub or gallop Respiratory: Clinically clear no rales no rhonchi Abdomen soft no rebound no guarding Neurologically intact  Discharge Instructions   Discharge Instructions    Diet - low sodium heart healthy   Complete by: As directed    Discharge instructions   Complete by: As directed    Please ensure that he is allowed to take large amounts of fluids to maintain his blood pressure as he suffers from severe orthostasis   Increase activity slowly   Complete by: As directed      Allergies as of 03/23/2019      Reactions   Metformin And Related Diarrhea      Medication List    STOP taking these medications   Afrin NoDrip Severe Congest 0.05 % nasal spray Generic drug: oxymetazoline   docusate sodium 100 MG capsule Commonly known as: Colace   DULoxetine 60 MG capsule Commonly known as: Cymbalta   ibuprofen 100 MG tablet Commonly  known as: ADVIL   lisinopril 5 MG tablet Commonly known as: ZESTRIL   True Metrix Blood Glucose Test test strip Generic drug: glucose blood   True Metrix Meter w/Device Kit   TRUEplus Lancets 26G Misc     TAKE these  medications   aspirin-sod bicarb-citric acid 325 MG Tbef tablet Commonly known as: ALKA-SELTZER Take 325 mg by mouth daily as needed (for acid).   ferrous sulfate 325 (65 FE) MG tablet Commonly known as: FerrouSul Take 1 tablet (325 mg total) by mouth 2 (two) times daily with a meal.   gabapentin 300 MG capsule Commonly known as: NEURONTIN Take 2 capsules (600 mg total) by mouth 3 (three) times daily.   hydrALAZINE 25 MG tablet Commonly known as: APRESOLINE Take 1 tablet (25 mg total) by mouth every 6 (six) hours as needed (SBP greater than 170 mmHg).   insulin aspart 100 UNIT/ML injection Commonly known as: novoLOG Inject 0-12 Units into the skin 3 (three) times daily with meals. As per sliding scale What changed: how much to take   insulin glargine 100 UNIT/ML injection Commonly known as: Lantus Inject 0.4 mLs (40 Units total) into the skin 2 (two) times daily. What changed:   how much to take  when to take this   Insulin Syringe-Needle U-100 31G X 5/16" 1 ML Misc Commonly known as: B-D INS SYR ULTRAFINE 1CC/31G Use for administration of insulin.   sertraline 50 MG tablet Commonly known as: ZOLOFT Take 1 tablet (50 mg total) by mouth daily. Start taking on: March 24, 2019      Allergies  Allergen Reactions  . Metformin And Related Diarrhea   Follow-up Information    Ladell Pier, MD.   Specialty: Internal Medicine Contact information: [place] [address] [phone]        [place].   Specialty: Emergency Medicine Contact information: [address] [id number] [place] [phone]           The results of significant diagnostics from this hospitalization (including imaging, microbiology, ancillary and laboratory) are listed below for reference.    Significant Diagnostic Studies: No results found.  Microbiology: Recent Results (from the  past 240 hour(s))  SARS CORONAVIRUS 2 (TAT 6-24 HRS) Nasopharyngeal Nasopharyngeal Swab     Status: None   Collection Time: 03/18/19 11:07 PM   Specimen: Nasopharyngeal Swab  Result Value Ref Range Status   SARS Coronavirus 2 NEGATIVE NEGATIVE Final    Comment: (NOTE) SARS-CoV-2 target nucleic acids are NOT DETECTED. The SARS-CoV-2 RNA is generally detectable in upper and lower respiratory specimens during the acute phase of infection. Negative results do not preclude SARS-CoV-2 infection, do not rule out co-infections with other pathogens, and should not be used as the sole basis for treatment or other patient management decisions. Negative results must be combined with clinical observations, patient history, and epidemiological information. The expected result is Negative. Fact Sheet for Patients: [employer] Fact Sheet for Healthcare Providers: https://www.woods-mathews.com/ This test is not yet approved or cleared by the [place] FDA and  has been authorized for detection and/or diagnosis of SARS-CoV-2 by FDA under an Emergency Use Authorization (EUA). This EUA will remain  in effect (meaning this test can be used) for the duration of the COVID-19 declaration under Section 56 4(b)(1) of the Act, 21 U.S.C. section 360bbb-3(b)(1), unless the authorization is terminated or revoked sooner. Performed at [facility], [place] [address],  [place], [place] [address]      Labs: Basic Metabolic Panel: Recent Labs  Lab 03/18/19 1403  03/19/19 0317 03/20/19 0756 03/21/19 0252 03/22/19 0146 03/23/19 0142  NA 132*   < > 131* 134* 135 133* 135  K 3.9   < > 4.9 4.2 5.0 4.7 4.6  CL 97*   < > 100 104 103 101 101  CO2 21*   < > 20* '22 24 23 25  '$ GLUCOSE 83   < > 531* 158* 387* 368* 234*  BUN 34*   < > 23* 12 17 23* 22*  CREATININE 1.84*   < > 1.42* 1.12 1.20 1.31* 1.30*  CALCIUM 9.5   < > 8.3* 8.5* 8.8* 9.0 8.8*  MG 2.2  --   --  2.0  1.8  --   --   PHOS  --   --   --  2.7  --   --  4.8*   < > = values in this interval not displayed.   Liver Function Tests: Recent Labs  Lab 03/20/19 0756 03/23/19 0142  ALBUMIN 3.2* 3.1*   No results for input(s): LIPASE, AMYLASE in the last 168 hours. No results for input(s): AMMONIA in the last 168 hours. CBC: Recent Labs  Lab 03/18/19 1403 03/20/19 0756 03/21/19 0252 03/22/19 0146 03/23/19 0142  WBC 6.9 4.3 4.2 4.4 5.2  NEUTROABS 4.7 1.9  --   --  2.5  HGB 10.0* 9.2* 8.9* 8.9* 8.9*  HCT 31.6* 28.1* 27.6* 27.6* 28.1*  MCV 83.4 82.6 83.1 82.9 84.4  PLT 408* 370 372 377 422*   Cardiac Enzymes: No results for input(s): CKTOTAL, CKMB, CKMBINDEX, TROPONINI in the last 168 hours. BNP: BNP (last 3 results) No results for input(s): BNP in the last 8760 hours.  ProBNP (last 3 results) No results for input(s): PROBNP in the last 8760 hours.  CBG: Recent Labs  Lab 03/22/19 1750 03/22/19 2034 03/22/19 2222 03/23/19 0901 03/23/19 1234  GLUCAP 245* 60* 88 92 110*       Signed:  Nita Sells MD   [facility] 03/23/2019, 3:51 PM

## 2019-03-23 NOTE — BH Assessment (Signed)
Patient has been accepted to [facility].  Accepted by Psych Nurse Practitioner, Marvia Pickles, on the behalf of Dr. Weber Cooks.  Attending Physician will be Dr. Weber Cooks.  Patient has been assigned to room 303, by [place].  Call report to [phone].  Representative/Transfer Coordinator is Allexus Ovens Patient pre-admitted by [facility] Patient Access Sharyn Lull)  [place] Staff Michiel Cowboy, Social Work) made aware of acceptance.  Patient bed will be available anytime after 8:30pm

## 2019-03-23 NOTE — TOC Progression Note (Addendum)
Transition of Care (TOC) - Progression Note    Patient Details  Name: Cody Mrozek. Campbell MRN: [id number] Date of Birth: 10-26-1965  Transition of Care [facility]) CM/SW [place], [place] Phone Number: 03/23/2019, 8:41 AM  Clinical Narrative:    1:18pm- CSW has made re-referral to [facility] and [facility]. Will fax referrals to wider search area.   8:41am- Pt without any bed offers at this time; will follow up with [facility]. Pt is voluntary, does need another psych assessment as last eval was on 10/19. Will page MD with this request.   Expected Discharge Plan: Psychiatric Hospital Barriers to Discharge: Psych Bed not available, Continued Medical Work up  Expected Discharge Plan and Services Expected Discharge Plan: [facility] In-house Referral: Clinical Social Work Discharge Planning Services: CM Consult Post Acute Care Choice: NA Living arrangements for the past 2 months: No permanent address     Social Determinants of Health (SDOH) Interventions    Readmission Risk Interventions Readmission Risk Prevention Plan 03/21/2019  Post Dischage Appt Not Complete  Appt Comments plan for [facility] placement.  Medication Screening Complete  Transportation Screening Complete  Some recent data might be hidden

## 2019-03-23 NOTE — Social Work (Addendum)
Behavioral Health Transport Requested Message left on voicemail to schedule pick up by Michiel Cowboy, CSW at 5:30pm .For any questions please contact [phone].  Pt accepted to [facility] Rm 303 He will be picked up by [facility] [facility] and his voluntary form is on the front of the chart.  Accepting MD is Dr. Claris Gower Please see note from Kerry Dory, [facility] Counselor for report #   [facility] Transport [facility] Patient Transportation Form  Date/Time of Request 03/23/2019 5:33pm  Patient Name Cody Campbell  Contact Number [phone]  [place] Up Time 8:30pm  Pick Up Location [facility] [place] [facility]  Drop Off Location [place] [facility] room 303  Roundtrip (Y/N) No   Additional Comments N/A

## 2019-03-23 NOTE — Consult Note (Signed)
Telepsych Consultation   Reason for Consult:  Suicidality Referring Physician:  Dr Mahala Menghini Location of Patient: [id number] Location of Provider: [facility]  Patient Identification: Cody Campbell MRN:  [id number] Principal Diagnosis: Orthostatic hypotension Diagnosis:  Principal Problem:   Orthostatic hypotension Active Problems:   DM (diabetes mellitus), type 2, uncontrolled (HCC)   Hypertension   Depression   AKI (acute kidney injury) (HCC)   Total Time spent with patient: 20 minutes  Subjective:   Cody Campbell is a 53 y.o. male patient admitted with dizziness and nausea. Patient endorses depressed mood and passive suicidal thoughts. Patient verbalizes, "I don't want to be a burden on anyone, I just want to go to sleep because I don't belong here anymore. Patient denies homicidal ideations, denies hallucinations. Patient denies history of suicide attempts.   HPI:  Patient endorses recent stressors including loss of employment and loss of home. Patient states, " I had just enough money to place my belongings in storage, now I don't have anything left. Patient states " I am a brittle diabetic, I can't live in the shelter, it's full of COVID!" Patient denies alcohol and drug use, denies access to weapons. Patient denies family history of psychiatric illness, denies family history of suicide attempts.  Patient continues to ruminate on need for housing, "I can't live on the street, I have to refrigerate my medication." Patient future-oriented, "I have grand-daughters that I want to be around for."   Past Psychiatric History: Denies  Risk to Self:   Risk to Others:   Prior Inpatient Therapy:   Prior Outpatient Therapy:    Past Medical History:  Past Medical History:  Diagnosis Date  . Diabetes mellitus without complication (HCC)   . Elevated LFTs 05/21/2011  . Hyperlipidemia   . Hypertension    No past surgical history on file. Family History:  Family History   Problem Relation Age of Onset  . Diabetes Mother   . Hypertension Father   . Diabetes Father   . Heart disease Father    Family Psychiatric  History: Denies Social History:  Social History   Substance and Sexual Activity  Alcohol Use Yes  . Alcohol/week: 1.0 standard drinks  . Types: 1 Shots of liquor per week   Comment: occasionally, 1 beer a day     Social History   Substance and Sexual Activity  Drug Use Yes  . Types: Marijuana    Social History   Socioeconomic History  . Marital status: Married    Spouse name: Not on file  . Number of children: Not on file  . Years of education: Not on file  . Highest education level: Not on file  Occupational History  . Not on file  Social Needs  . Financial resource strain: Not on file  . Food insecurity    Worry: Not on file    Inability: Not on file  . Transportation needs    Medical: Not on file    Non-medical: Not on file  Tobacco Use  . Smoking status: Current Every Day Smoker    Packs/day: 2.00    Years: 35.00    Pack years: 70.00    Types: Cigars  . Smokeless tobacco: Current User  . Tobacco comment: Smoking 1-2 small cigars daily  Substance and Sexual Activity  . Alcohol use: Yes    Alcohol/week: 1.0 standard drinks    Types: 1 Shots of liquor per week    Comment: occasionally, 1 beer a day  .  Drug use: Yes    Types: Marijuana  . Sexual activity: Yes    Partners: Female    Birth control/protection: Condom  Lifestyle  . Physical activity    Days per week: Not on file    Minutes per session: Not on file  . Stress: Not on file  Relationships  . Social Musicianconnections    Talks on phone: Not on file    Gets together: Not on file    Attends religious service: Not on file    Active member of club or organization: Not on file    Attends meetings of clubs or organizations: Not on file    Relationship status: Not on file  Other Topics Concern  . Not on file  Social History Narrative  . Not on file    Additional Social History:    Allergies:   Allergies  Allergen Reactions  . Metformin And Related Diarrhea    Labs:  Results for orders placed or performed during the hospital encounter of 03/18/19 (from the past 48 hour(s))  Glucose, capillary     Status: Abnormal   Collection Time: 03/21/19 12:11 PM  Result Value Ref Range   Glucose-Capillary 267 (H) 70 - 99 mg/dL  Glucose, capillary     Status: Abnormal   Collection Time: 03/21/19  4:59 PM  Result Value Ref Range   Glucose-Capillary 137 (H) 70 - 99 mg/dL  Glucose, capillary     Status: Abnormal   Collection Time: 03/21/19  8:37 PM  Result Value Ref Range   Glucose-Capillary 257 (H) 70 - 99 mg/dL  Basic metabolic panel     Status: Abnormal   Collection Time: 03/22/19  1:46 AM  Result Value Ref Range   Sodium 133 (L) 135 - 145 mmol/L   Potassium 4.7 3.5 - 5.1 mmol/L   Chloride 101 98 - 111 mmol/L   CO2 23 22 - 32 mmol/L   Glucose, Bld 368 (H) 70 - 99 mg/dL   BUN 23 (H) 6 - 20 mg/dL   Creatinine, Ser 1.611.31 (H) 0.61 - 1.24 mg/dL   Calcium 9.0 8.9 - 09.610.3 mg/dL   GFR calc non Af Amer >60 >60 mL/min   GFR calc Af Amer >60 >60 mL/min   Anion gap 9 5 - 15    Comment: Performed at [facility], [address] [place], [place] [address]  CBC     Status: Abnormal   Collection Time: 03/22/19  1:46 AM  Result Value Ref Range   WBC 4.4 4.0 - 10.5 K/uL   RBC 3.33 (L) 4.22 - 5.81 MIL/uL   Hemoglobin 8.9 (L) 13.0 - 17.0 g/dL   HCT 98.127.6 (L) 19.139.0 - 47.852.0 %   MCV 82.9 80.0 - 100.0 fL   MCH 26.7 26.0 - 34.0 pg   MCHC 32.2 30.0 - 36.0 g/dL   RDW 29.517.6 (H) 62.111.5 - 30.815.5 %   Platelets 377 150 - 400 K/uL   nRBC 0.0 0.0 - 0.2 %    Comment: Performed at [facility], [address] [place], [place] [address]  Glucose, capillary     Status: Abnormal   Collection Time: 03/22/19  3:53 AM  Result Value Ref Range   Glucose-Capillary 332 (H) 70 - 99 mg/dL  Glucose, capillary     Status: Abnormal   Collection Time: 03/22/19   8:10 AM  Result Value Ref Range   Glucose-Capillary 340 (H) 70 - 99 mg/dL  Glucose, capillary  Status: Abnormal   Collection Time: 03/22/19 11:57 AM  Result Value Ref Range   Glucose-Capillary 64 (L) 70 - 99 mg/dL  Glucose, capillary     Status: Abnormal   Collection Time: 03/22/19 12:39 PM  Result Value Ref Range   Glucose-Capillary 141 (H) 70 - 99 mg/dL  Glucose, capillary     Status: Abnormal   Collection Time: 03/22/19  2:02 PM  Result Value Ref Range   Glucose-Capillary 144 (H) 70 - 99 mg/dL  Glucose, capillary     Status: Abnormal   Collection Time: 03/22/19  5:50 PM  Result Value Ref Range   Glucose-Capillary 245 (H) 70 - 99 mg/dL  Glucose, capillary     Status: Abnormal   Collection Time: 03/22/19  8:34 PM  Result Value Ref Range   Glucose-Capillary 60 (L) 70 - 99 mg/dL  Glucose, capillary     Status: None   Collection Time: 03/22/19 10:22 PM  Result Value Ref Range   Glucose-Capillary 88 70 - 99 mg/dL  CBC with Differential/Platelet     Status: Abnormal   Collection Time: 03/23/19  1:42 AM  Result Value Ref Range   WBC 5.2 4.0 - 10.5 K/uL   RBC 3.33 (L) 4.22 - 5.81 MIL/uL   Hemoglobin 8.9 (L) 13.0 - 17.0 g/dL   HCT 16.1 (L) 09.6 - 04.5 %   MCV 84.4 80.0 - 100.0 fL   MCH 26.7 26.0 - 34.0 pg   MCHC 31.7 30.0 - 36.0 g/dL   RDW 40.9 (H) 81.1 - 91.4 %   Platelets 422 (H) 150 - 400 K/uL   nRBC 0.0 0.0 - 0.2 %   Neutrophils Relative % 47 %   Neutro Abs 2.5 1.7 - 7.7 K/uL   Lymphocytes Relative 29 %   Lymphs Abs 1.5 0.7 - 4.0 K/uL   Monocytes Relative 14 %   Monocytes Absolute 0.7 0.1 - 1.0 K/uL   Eosinophils Relative 9 %   Eosinophils Absolute 0.5 0.0 - 0.5 K/uL   Basophils Relative 1 %   Basophils Absolute 0.1 0.0 - 0.1 K/uL   Immature Granulocytes 0 %   Abs Immature Granulocytes 0.00 0.00 - 0.07 K/uL    Comment: Performed at [facility], [address] [place], [place] [address]  Renal function panel     Status: Abnormal   Collection Time: 03/23/19   1:42 AM  Result Value Ref Range   Sodium 135 135 - 145 mmol/L   Potassium 4.6 3.5 - 5.1 mmol/L   Chloride 101 98 - 111 mmol/L   CO2 25 22 - 32 mmol/L   Glucose, Bld 234 (H) 70 - 99 mg/dL   BUN 22 (H) 6 - 20 mg/dL   Creatinine, Ser 6.21 (H) 0.61 - 1.24 mg/dL   Calcium 8.8 (L) 8.9 - 10.3 mg/dL   Phosphorus 4.8 (H) 2.5 - 4.6 mg/dL   Albumin 3.1 (L) 3.5 - 5.0 g/dL   GFR calc non Af Amer >60 >60 mL/min   GFR calc Af Amer >60 >60 mL/min   Anion gap 9 5 - 15    Comment: Performed at [facility], [address], [place], [place] [address]  Glucose, capillary     Status: None   Collection Time: 03/23/19  9:01 AM  Result Value Ref Range   Glucose-Capillary 92 70 - 99 mg/dL    Medications:  Current Facility-Administered Medications  Medication Dose Route Frequency Provider Last Rate Last Dose  . acetaminophen (TYLENOL)  tablet 650 mg  650 mg Oral Q6H PRN Hillary Bow, DO   650 mg at 03/19/19 1324   Or  . acetaminophen (TYLENOL) suppository 650 mg  650 mg Rectal Q6H PRN Hillary Bow, DO      . enoxaparin (LOVENOX) injection 40 mg  40 mg Subcutaneous Q24H Lyda Perone M, DO   40 mg at 03/21/19 0841  . ferrous sulfate tablet 325 mg  325 mg Oral BID WC Lyda Perone M, DO   325 mg at 03/23/19 1100  . gabapentin (NEURONTIN) capsule 600 mg  600 mg Oral TID Hillary Bow, DO   600 mg at 03/23/19 1100  . hydrALAZINE (APRESOLINE) tablet 25 mg  25 mg Oral Q6H PRN Berton Mount I, MD      . influenza vac split quadrivalent PF (FLUARIX) injection 0.5 mL  0.5 mL Intramuscular Tomorrow-1000 Berton Mount I, MD      . insulin aspart (novoLOG) injection 0-15 Units  0-15 Units Subcutaneous TID WC Hillary Bow, DO   5 Units at 03/22/19 1818  . insulin glargine (LANTUS) injection 45 Units  45 Units Subcutaneous q morning - 10a Rhetta Mura, MD      . ondansetron (ZOFRAN) tablet 4 mg  4 mg Oral Q6H PRN Hillary Bow, DO       Or  . ondansetron [facility]) injection 4 mg   4 mg Intravenous Q6H PRN Hillary Bow, DO      . oxymetazoline (AFRIN) 0.05 % nasal spray 1 spray  1 spray Each Nare BID PRN Hillary Bow, DO        Musculoskeletal: Strength & Muscle Tone: within normal limits Gait & Station: normal Patient leans: N/A  Psychiatric Specialty Exam: Physical Exam  Nursing note and vitals reviewed. Constitutional: He is oriented to person, place, and time. He appears well-developed.  HENT:  Head: Normocephalic.  Cardiovascular: Normal rate.  Respiratory: Effort normal.  Neurological: He is alert and oriented to person, place, and time.  Psychiatric: His speech is normal and behavior is normal. Judgment normal. Cognition and memory are normal. He exhibits a depressed mood. He expresses suicidal ideation.    Review of Systems  Constitutional: Negative.   HENT: Negative.   Eyes: Negative.   Respiratory: Negative.   Cardiovascular: Negative.   Gastrointestinal: Negative.   Genitourinary: Negative.   Musculoskeletal: Negative.   Skin: Negative.   Neurological: Negative.   Endo/Heme/Allergies: Negative.   Psychiatric/Behavioral: Positive for depression.    Blood pressure (!) 81/63, pulse 96, temperature 98.8 F (37.1 C), temperature source Oral, resp. rate 18, height  (1.88 m), weight 74.8 kg, SpO2 100 %.Body mass index is 21.18 kg/m.  General Appearance: Casual  Eye Contact:  Good  Speech:  Clear and Coherent  Volume:  Normal  Mood:  Euthymic  Affect:  Appropriate  Thought Process:  Coherent, Goal Directed and Descriptions of Associations: Intact  Orientation:  Full (Time, Place, and Person)  Thought Content:  Logical  Suicidal Thoughts:  Yes.  without intent/plan  Homicidal Thoughts:  No  Memory:  Immediate;   Good Recent;   Good Remote;   Good  Judgement:  Fair  Insight:  Fair  Psychomotor Activity:  Normal  Concentration:  Concentration: Good and Attention Span: Good  Recall:  Good  Fund of Knowledge:  Good  Language:   Good  Akathisia:  NA  Handed:  Right  AIMS (if indicated):     Assets:  Communication Skills Desire  for Improvement Vocational/Educational  ADL's:  Intact  Cognition:  WNL  Sleep:        Treatment Plan Summary: Daily contact with patient to assess and evaluate symptoms and progress in treatment  Consider Zoloft 50mg  by mouth daily.  Disposition: Recommend psychiatric Inpatient admission when medically cleared.  This service was provided via telemedicine using a 2-way, interactive audio and video technology.  Names of all persons participating in this telemedicine service and their role in this encounter. Name: Sallye Lat Patient  Name:Beverlyn Mcginness Hall Busing Role: [place], [place] 03/23/2019 11:42 AM

## 2019-03-23 NOTE — TOC Transition Note (Signed)
Transition of Care (TOC) - CM/SW Discharge Note   Patient Details  Name: Cody Campbell. Mon[place] MRN: [id number] Date of Birth: 1965-12-26  Transition of Care [facility]) CM/SW Contact:  Cody Campbell, [place] Phone Number: 03/23/2019, 4:19 PM   Clinical Narrative:    CSW contacted by Kerry Dory at [facility]; requested I send updated voluntary form to him. Met with pt discussed that there is an open bed at [facility] available. Pt worried about whether or not he will get his medications while there. Explained that it is a medical unit that also meets individuals mental health needs. Pt states his son just came to visit him and left and he will let people know where he is going.   Pt insistent on that they wont take away his cell phone- discussed that that decision would be based on [facility]'s policies not mine. Pt signed voluntary form, original placed on chart and faxed to [facility] at [phone]. Await bed number and timing for discharge.   Pt RN aware, new transport service [facility] will take pt.    Final next level of care: Psychiatric Hospital Barriers to Discharge: Barriers Resolved   Patient Goals and CMS Choice Patient states their goals for this hospitalization and ongoing recovery are:: to have some support with his feelings of hopelessness CMS Medicare.gov Compare Post Acute Care list provided to:: (n/a) Choice offered to / list presented to : NA  Discharge Placement              Patient chooses bed at: Other - please specify in the comment section below:([facility]) Patient to be transferred to facility by: [place] Name of family member notified: pt has spoken with family Patient and family notified of of transfer: 03/23/19  Discharge Plan and Services In-house Referral: Clinical Social Work Discharge Planning Services: CM Consult Post Acute Care Choice: NA                               Social Determinants of Health (SDOH) Interventions      Readmission Risk Interventions Readmission Risk Prevention Plan 03/21/2019  Post Dischage Appt Not Complete  Appt Comments plan for [facility] placement.  Medication Screening Complete  Transportation Screening Complete  Some recent data might be hidden

## 2019-03-24 ENCOUNTER — Other Ambulatory Visit: Payer: Self-pay

## 2019-03-24 DIAGNOSIS — F4323 Adjustment disorder with mixed anxiety and depressed mood: Principal | ICD-10-CM

## 2019-03-24 LAB — GLUCOSE, CAPILLARY
Glucose-Capillary: 223 mg/dL — ABNORMAL HIGH (ref 70–99)
Glucose-Capillary: 62 mg/dL — ABNORMAL LOW (ref 70–99)
Glucose-Capillary: 92 mg/dL (ref 70–99)
Glucose-Capillary: 294 mg/dL — ABNORMAL HIGH (ref 70–99)
Glucose-Capillary: 303 mg/dL — ABNORMAL HIGH (ref 70–99)
Glucose-Capillary: 22 mg/dL — CL (ref 70–99)
Glucose-Capillary: 144 mg/dL — ABNORMAL HIGH (ref 70–99)

## 2019-03-24 MED ORDER — OXYMETAZOLINE HCL 0.05 % NA SOLN
1.0000 | Freq: Two times a day (BID) | NASAL | Status: DC
  Administered 2019-03-24 – 2019-04-07 (×28): 1 via NASAL
  Filled 2019-03-24: qty 15

## 2019-03-24 MED ORDER — FLUTICASONE PROPIONATE 50 MCG/ACT NA SUSP
2.0000 | Freq: Every day | NASAL | Status: DC
  Administered 2019-03-24 – 2019-04-01 (×3): 2 via NASAL
  Filled 2019-03-24: qty 16

## 2019-03-24 MED ORDER — GABAPENTIN 600 MG PO TABS
600.0000 mg | ORAL_TABLET | Freq: Three times a day (TID) | ORAL | Status: DC
  Administered 2019-03-24 – 2019-04-07 (×41): 600 mg via ORAL
  Filled 2019-03-24 (×41): qty 1

## 2019-03-24 NOTE — Progress Notes (Signed)
Recreation Therapy Notes  INPATIENT RECREATION THERAPY ASSESSMENT  Patient Details Name: Cody Campbell MRN: [id number] DOB: 24-May-1966 Today's Date: 03/24/2019       Information Obtained From: Patient  Able to Participate in Assessment/Interview: Yes  Patient Presentation: Responsive  Reason for Admission (Per Patient): Active Symptoms  Patient Stressors:    Coping Skills:   TV  Leisure Interests (2+):  Individual - TV(Electronics)  Frequency of Recreation/Participation:    Awareness of Community Resources:     [employer]:     Current Use:    If no, Barriers?:    Expressed Interest in [employer] Information:    [place] of Residence:  [place]  Patient Main Form of Transportation:    Patient Strengths:  Technology  Patient Identified Areas of Improvement:  N/A  Patient Goal for Hospitalization:  Getting outside resources  Current SI (including self-harm):  No  Current HI:  No  Current AVH: No  Staff Intervention Plan: Group Attendance, Collaborate with Interdisciplinary Treatment Team  Consent to Intern Participation: N/A  Celine Dishman 03/24/2019, 3:23 PM

## 2019-03-24 NOTE — H&P (Signed)
Psychiatric Admission Assessment Adult  Patient Identification: Cody Campbell. Denn MRN:  [id number] Date of Evaluation:  03/24/2019 Chief Complaint:  Major Depression Principal Diagnosis: Adjustment disorder with mixed anxiety and depressed mood Diagnosis:  Principal Problem:   Adjustment disorder with mixed anxiety and depressed mood Active Problems:   DM (diabetes mellitus), type 2, uncontrolled (HCC)   Hypertension   Diabetic neuropathy (HCC)  History of Present Illness: Patient seen and chart reviewed.  Patient sent to [place] from [facility] after being evaluated in the hospital and allegedly making suicidal statements.  Patient's chief complaint to me "I was blindsided".  Patient tells a story of multiple rapid social losses.  He says that a few weeks ago he turned in a note at his work saying that he could no longer climb ladders because of his diabetic neuropathy.  The day after that he claims he was late to work and was fired.  He finds the situation extremely unjust.  After being fired he was denied unemployment.  Has lost his house and now his car as well.  Was using his remaining finances to stay in a hotel but now has run out of money.  A friend got very worried about him and made him come into the hospital.  Patient reportedly made a statement about suicide while he was at [facility].  In interview today he says that he might of said something about being "better off dead" but he absolutely denies any thoughts of wanting to die or wanting to kill himself.  He says that his problems are all right now due to his social situation and his medical problems.  Minimizes depression.  Admits to sleeping poorly but blames that on his pain.  Patient denies feeling hopeless or helpless.  Denies suicidal thoughts.  Denies any psychotic symptoms.  Has not been getting any outpatient psychiatric treatment.  Does not abuse alcohol or drugs. Associated Signs/Symptoms: Depression Symptoms:  depressed  mood, insomnia, (Hypo) Manic Symptoms:  None reported Anxiety Symptoms:  Excessive Worry, Psychotic Symptoms:  Patient was paranoid in the nonpsychotic sense of having a lot of feelings that things were rigged against him.  More of a personality feature.  Did not appear to be actively psychotic. PTSD Symptoms: Negative Total Time spent with patient: 1 hour  Past Psychiatric History: Patient claims to have no past psychiatric history.  Says he is never seen a therapist psychiatrist or mental health provider.  I mention to him that a note in the chart says that he had been prescribed an antidepressant medicine at one time.  Patient acknowledges that but says he did not take it for long did not follow-up and cannot remember what the name of it was.  He denies ever having tried to hurt himself in the past.  Denies any history of mania.  Denies any history anytime as an adult of abusing alcohol or drugs.  Is the patient at risk to self? Yes.    Has the patient been a risk to self in the past 6 months? No.  Has the patient been a risk to self within the distant past? No.  Is the patient a risk to others? No.  Has the patient been a risk to others in the past 6 months? No.  Has the patient been a risk to others within the distant past? No.   Prior Inpatient Therapy:   Prior Outpatient Therapy:    Alcohol Screening: 1. How often do you have a drink containing alcohol?: 2  to 3 times a week 2. How many drinks containing alcohol do you have on a typical day when you are drinking?: 3 or 4 3. How often do you have six or more drinks on one occasion?: Less than monthly AUDIT-C Score: 5 4. How often during the last year have you found that you were not able to stop drinking once you had started?: Less than monthly 5. How often during the last year have you failed to do what was normally expected from you becasue of drinking?: Less than monthly 6. How often during the last year have you needed a first  drink in the morning to get yourself going after a heavy drinking session?: Less than monthly 7. How often during the last year have you had a feeling of guilt of remorse after drinking?: Less than monthly 9. Have you or someone else been injured as a result of your drinking?: No 10. Has a relative or friend or a doctor or another health worker been concerned about your drinking or suggested you cut down?: No Alcohol Use Disorder Identification Test Final Score (AUDIT): 9 Alcohol Brief Interventions/Follow-up: Brief Advice, Alcohol Education Substance Abuse History in the last 12 months:  No. Consequences of Substance Abuse: Negative Previous Psychotropic Medications: Yes  Psychological Evaluations: No  Past Medical History:  Past Medical History:  Diagnosis Date  . Diabetes mellitus without complication (HCC)   . Elevated LFTs 05/21/2011  . Hyperlipidemia   . Hypertension    History reviewed. No pertinent surgical history. Family History:  Family History  Problem Relation Age of Onset  . Diabetes Mother   . Hypertension Father   . Diabetes Father   . Heart disease Father    Family Psychiatric  History: Patient denies any family history saying "I am from a family of strong people" Tobacco Screening:   Social History:  Social History   Substance and Sexual Activity  Alcohol Use Yes  . Alcohol/week: 1.0 standard drinks  . Types: 1 Shots of liquor per week   Comment: occasionally, 1 beer a day     Social History   Substance and Sexual Activity  Drug Use Yes  . Types: Marijuana    Additional Social History: Marital status: (P) Divorced Divorced, when?: (P) 20 years ago What types of issues is patient dealing with in the relationship?: (P) Pt reports that marriage ended due to "her infidelity and my illness". Does patient have children?: (P) Yes How many children?: (P) 1 How is patient's relationship with their children?: (P) Pt reports that he has one biological child  and raised 7 others. Patient reports that relationship with son is "great".                         Allergies:   Allergies  Allergen Reactions  . Metformin And Related Diarrhea   Lab Results:  Results for orders placed or performed during the hospital encounter of 03/23/19 (from the past 48 hour(s))  Glucose, capillary     Status: Abnormal   Collection Time: 03/23/19 10:39 PM  Result Value Ref Range   Glucose-Capillary 193 (H) 70 - 99 mg/dL   Comment 1 Notify RN   Glucose, capillary     Status: Abnormal   Collection Time: 03/24/19  1:28 AM  Result Value Ref Range   Glucose-Capillary 223 (H) 70 - 99 mg/dL   Comment 1 Notify RN   Glucose, capillary     Status: Abnormal  Collection Time: 03/24/19  6:45 AM  Result Value Ref Range   Glucose-Capillary 62 (L) 70 - 99 mg/dL   Comment 1 Notify RN   Glucose, capillary     Status: None   Collection Time: 03/24/19  7:52 AM  Result Value Ref Range   Glucose-Capillary 92 70 - 99 mg/dL   Comment 1 Notify RN   Glucose, capillary     Status: Abnormal   Collection Time: 03/24/19 11:24 AM  Result Value Ref Range   Glucose-Capillary 294 (H) 70 - 99 mg/dL   Comment 1 Notify RN   Glucose, capillary     Status: Abnormal   Collection Time: 03/24/19  4:31 PM  Result Value Ref Range   Glucose-Capillary 303 (H) 70 - 99 mg/dL    Blood Alcohol level:  No results found for: [facility]  Metabolic Disorder Labs:  Lab Results  Component Value Date   HGBA1C 10.0 (H) 03/18/2019   MPG 240 03/18/2019   No results found for: PROLACTIN Lab Results  Component Value Date   CHOL 189 01/25/2019   TRIG 82 01/25/2019   HDL 71 01/25/2019   CHOLHDL 2.7 01/25/2019   VLDL 11 05/15/2013   LDLCALC 102 (H) 01/25/2019   LDLCALC 80 05/15/2013    Current Medications: Current Facility-Administered Medications  Medication Dose Route Frequency Provider Last Rate Last Dose  . alum & mag hydroxide-simeth (MAALOX/MYLANTA) 200-200-20 MG/5ML suspension 30 mL   30 mL Oral Q4H PRN Gillermo Murdochhompson, Jacqueline, NP      . fluticasone (FLONASE) 50 MCG/ACT nasal spray 2 spray  2 spray Each Nare Daily Clapacs, Jackquline DenmarkJohn T, MD   2 spray at 03/24/19 1506  . gabapentin (NEURONTIN) tablet 600 mg  600 mg Oral TID Clapacs, John T, MD   600 mg at 03/24/19 1634  . hydrOXYzine (ATARAX/VISTARIL) tablet 25 mg  25 mg Oral Q6H PRN Gillermo Murdochhompson, Jacqueline, NP      . insulin aspart (novoLOG) injection 0-15 Units  0-15 Units Subcutaneous Q4H Gillermo Murdochhompson, Jacqueline, NP   11 Units at 03/24/19 1635  . magnesium hydroxide (MILK OF MAGNESIA) suspension 30 mL  30 mL Oral Daily PRN Gillermo Murdochhompson, Jacqueline, NP      . oxymetazoline (AFRIN) 0.05 % nasal spray 1 spray  1 spray Each Nare BID Clapacs, Jackquline DenmarkJohn T, MD       PTA Medications: Medications Prior to Admission  Medication Sig Dispense Refill Last Dose  . aspirin-sod bicarb-citric acid (ALKA-SELTZER) 325 MG TBEF tablet Take 325 mg by mouth daily as needed (for acid).     . ferrous sulfate (FERROUSUL) 325 (65 FE) MG tablet Take 1 tablet (325 mg total) by mouth 2 (two) times daily with a meal. 60 tablet 3   . gabapentin (NEURONTIN) 300 MG capsule Take 2 capsules (600 mg total) by mouth 3 (three) times daily. 180 capsule 3   . hydrALAZINE (APRESOLINE) 25 MG tablet Take 1 tablet (25 mg total) by mouth every 6 (six) hours as needed (SBP greater than 170 mmHg). 6 tablet 0   . insulin aspart (NOVOLOG) 100 UNIT/ML injection Inject 0-12 Units into the skin 3 (three) times daily with meals. As per sliding scale (Patient taking differently: Inject 6 Units into the skin 3 (three) times daily with meals. As per sliding scale) 10 mL 3   . insulin glargine (LANTUS) 100 UNIT/ML injection Inject 0.4 mLs (40 Units total) into the skin 2 (two) times daily. (Patient taking differently: Inject 35 Units into the skin daily. ) 30 mL  3   . Insulin Syringe-Needle U-100 (B-D INS SYR ULTRAFINE 1CC/31G) 31G X 5/16" 1 ML MISC Use for administration of insulin. 100 each 12   .  sertraline (ZOLOFT) 50 MG tablet Take 1 tablet (50 mg total) by mouth daily. 4 tablet 0     Musculoskeletal: Strength & Muscle Tone: within normal limits Gait & Station: Gait is limited because of his peripheral neuropathy Patient leans: N/A  Psychiatric Specialty Exam: Physical Exam  Nursing note and vitals reviewed. Constitutional: He appears well-developed and well-nourished.  HENT:  Head: Normocephalic and atraumatic.  Eyes: Pupils are equal, round, and reactive to light. Conjunctivae are normal.  Neck: Normal range of motion.  Cardiovascular: Regular rhythm and normal heart sounds.  Respiratory: Effort normal. No respiratory distress.  GI: Soft.  Musculoskeletal: Normal range of motion.  Neurological: He is alert.  Skin: Skin is warm and dry.  Psychiatric: His speech is normal. His mood appears anxious. He is agitated. He is not aggressive. Thought content is paranoid. Thought content is not delusional. Cognition and memory are normal. He expresses impulsivity. He expresses no homicidal and no suicidal ideation.    Review of Systems  Constitutional: Negative.   HENT: Negative.   Eyes: Negative.   Respiratory: Negative.   Cardiovascular: Negative.   Gastrointestinal: Negative.   Musculoskeletal: Negative.   Skin: Negative.   Neurological: Negative.   Psychiatric/Behavioral: Negative for depression, hallucinations, substance abuse and suicidal ideas. The patient is nervous/anxious and has insomnia.     Blood pressure 105/64, pulse (!) 116, temperature 98.4 F (36.9 C), temperature source Oral, resp. rate 18, height 6\' 2"  (1.88 m), weight 74.8 kg, SpO2 100 %.Body mass index is 21.17 kg/m.  General Appearance: Casual  Eye Contact:  Good  Speech:  Clear and Coherent  Volume:  Increased  Mood:  Irritable  Affect:  Congruent  Thought Process:  Coherent  Orientation:  Full (Time, Place, and Person)  Thought Content:  Logical and Tangential  Suicidal Thoughts:  No   Homicidal Thoughts:  No  Memory:  Immediate;   Fair Recent;   Fair Remote;   Fair  Judgement:  Impaired  Insight:  Shallow  Psychomotor Activity:  Normal  Concentration:  Concentration: Fair  Recall:  [employer] of Knowledge:  Fair  Language:  Fair  Akathisia:  No  Handed:  Right  AIMS (if indicated):     Assets:  Desire for Improvement  ADL's:  Impaired  Cognition:  WNL  Sleep:  Number of Hours: 4    Treatment Plan Summary: Daily contact with patient to assess and evaluate symptoms and progress in treatment, Medication management and Plan Patient with multiple severe life stressors both from the his social environment and his health.  He was sent to our facility because of a report of having suicidal thoughts.  Patient is now very angry about being in a psychiatric unit.  Denies any suicidality.  He is saying that he wants to be discharged right away but acknowledges that he has no place to go.  He says he would not feel safe going to a shelter because of his health.  Has 1 son but has not made contact with him about living there.  Claims to not have any other financial resources.  Patient was opposed to starting any psychiatric medication as he would not acknowledge being "depressed".  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  Continue 15-minute checks.  Continue daily group  meetings work on trying to find some kind of safe discharge plan.  Observation Level/Precautions:  15 minute checks  Laboratory:  Chemistry Profile  Psychotherapy:    Medications:    Consultations:    Discharge Concerns:    Estimated LOS:  Other:     Physician Treatment Plan for Primary Diagnosis: Adjustment disorder with mixed anxiety and depressed mood Long Term Goal(s): Improvement in symptoms so as ready for discharge  Short Term Goals: Ability to verbalize feelings will improve, Ability to demonstrate self-control will improve and Ability to  identify and develop effective coping behaviors will improve  Physician Treatment Plan for Secondary Diagnosis: Principal Problem:   Adjustment disorder with mixed anxiety and depressed mood Active Problems:   DM (diabetes mellitus), type 2, uncontrolled (HCC)   Hypertension   Diabetic neuropathy (HCC)  Long Term Goal(s): Improvement in symptoms so as ready for discharge  Short Term Goals: Ability to maintain clinical measurements within normal limits will improve  I certify that inpatient services furnished can reasonably be expected to improve the patient's condition.    Mordecai Rasmussen, MD 10/23/20205:39 PM

## 2019-03-24 NOTE — BHH Suicide Risk Assessment (Signed)
[facility] INPATIENT:  Family/Significant Other Suicide Prevention Education  Suicide Prevention Education:  Contact Attempts: Huzaifa Viney, son, [phone] has been identified by the patient as the family member/significant other with whom the patient will be residing, and identified as the person(s) who will aid the patient in the event of a mental health crisis.  With written consent from the patient, two attempts were made to provide suicide prevention education, prior to and/or following the patient's discharge.  We were unsuccessful in providing suicide prevention education.  A suicide education pamphlet was given to the patient to share with family/significant other.  Date and time of first attempt: 03/24/2019 at 1:41pm Date and time of second attempt: Second attempt is needed.   CSW notes that HIPAA compliant voicemail was left.  Rozann Lesches 03/24/2019, 1:40 PM

## 2019-03-24 NOTE — BHH Counselor (Signed)
CSW attempted to contact Cody Campbell at [place] at [phone] to see if someone had applied for patient to have Medicaid through [facility].  CSW left HIPAA compliant voicemail.  [name], MSW, LCSW 03/24/2019 1:33 PM

## 2019-03-24 NOTE — Progress Notes (Signed)
Patient alert and oriented x 4, affect is blunted and irritable, thoughts are organized and coherent . Patient was noted in bed with eyes closed, he woke in the middle of the night around 3;00 AM demanding he be released , he dose not like the way we treat him, his blood sugar was checked he refused novo og at this time,he was advised to wait till morning time to communicate with HCP, 15 minutes safety checks maintained will continue to monitor.

## 2019-03-24 NOTE — BHH Counselor (Signed)
Adult Comprehensive Assessment  Patient ID: Cody Campbell. Laursen, male   DOB: [birth year], 53 y.o.   MRN: [id number]  Information Source: Information source: (P) Patient  Current Stressors:  Patient states their primary concerns and needs for treatment are:: (P) Pt reports "I actually checked myself in.  I was getting overwhelemd and homeless." Patient states their goals for this hospitilization and ongoing recovery are:: (P) Pt reports "get back out. I need resources to get a job and a home." Educational / Learning stressors: (P) Pt denies. Employment / Job issues: (P) Pt reports that he is unemployed. Family Relationships: (P) Pt reports "not being with my grandchildren". Financial / Lack of resources (include bankruptcy): (P) Pt reports no income. Housing / Lack of housing: (P) Pt reports that he is homeless. Physical health (include injuries & life threatening diseases): (P) Pt reports "severe neuropathy in leg, diminished vision, diabetees". Social relationships: (P) Pt denies. Substance abuse: (P) Pt denies. Bereavement / Loss: (P) Pt reports that a cousin in [place] recently passed from Covid, however, they were not close.  Living/Environment/Situation:  Living Arrangements: (P) Alone Living conditions (as described by patient or guardian): (P) Pt reports that he has been homeless for one week. What is atmosphere in current home: (P) Temporary  Family History:  Marital status: (P) Divorced Divorced, when?: (P) 20 years ago What types of issues is patient dealing with in the relationship?: (P) Pt reports that marriage ended due to "her infidelity and my illness". Does patient have children?: (P) Yes How many children?: (P) 1 How is patient's relationship with their children?: (P) Pt reports that he has one biological child and raised 7 others. Patient reports that relationship with son is "great".  Childhood History:  Description of patient's relationship with caregiver when they were a  child: (P) Pt reports "it was an old fashioned family". Patient's description of current relationship with people who raised him/her: (P) Pt reports "my mother is in [place] and we talk evern now and then, my father is deceased". How were you disciplined when you got in trouble as a child/adolescent?: (P) Pt reports "they didn't spare the rod". Does patient have siblings?: (P) Yes Number of Siblings: (P) 10 Description of patient's current relationship with siblings: (P) Pt reports "we're distant, we're spread out all over the place". Did patient suffer any verbal/emotional/physical/sexual abuse as a child?: (P) No Did patient suffer from severe childhood neglect?: (P) No Has patient ever been sexually abused/assaulted/raped as an adolescent or adult?: (P) No Was the patient ever a victim of a crime or a disaster?: (P) Yes Patient description of being a victim of a crime or disaster: (P) Pt reports "I was stuck up a couple of times growing up in [place] domestic violence?: No Has patient been effected by domestic violence as an adult?: No  Education:  Highest grade of school patient has completed: some college Currently a Ship broker?: No Learning disability?: No  Employment/Work Situation:   Employment situation: Unemployed What is the longest time patient has a held a job?: 15 years Where was the patient employed at that time?: Engineer, drilling at a clinic in [place] Did You Receive Any Psychiatric Treatment/Services While in Passenger transport manager?: No(NA) Are There Guns or Other Weapons in [place]?: No  Financial Resources:   Financial resources: No income Does patient have a Programmer, applications or guardian?: No  Alcohol/Substance Abuse:   What has been your use of drugs/alcohol within the last 12 months?: Pt denies. If  attempted suicide, did drugs/alcohol play a role in this?: No Alcohol/Substance Abuse Treatment Hx: Denies past history Has alcohol/substance abuse ever caused legal  problems?: No  Social Support System:   Patient's Community Support System: None Type of faith/religion: Pt reports "I am spiritual".  Leisure/Recreation:   Leisure and Hobbies: Pt reports "electronics, coding, building circuits and devices and repair TV's".  Strengths/Needs:   What is the patient's perception of their strengths?: Pt reports "understand machines and technology". Patient states these barriers may affect/interfere with their treatment: Pt reports that he is homeless. Patient states these barriers may affect their return to the community: Pt reports that he is homeless.  Discharge Plan:   Currently receiving community mental health services: No Patient states concerns and preferences for aftercare planning are: Pt reports that he does not feel he needs mental health treatment. Patient states they will know when they are safe and ready for discharge when: Pt reports "when I have what I need to get back literally and figuratively on my feet". Does patient have access to transportation?: No Does patient have financial barriers related to discharge medications?: No Patient description of barriers related to discharge medications: Pt reports that he does not have insurance. Plan for no access to transportation at discharge: Pt rpeorts that he does not have access to transportation. Plan for living situation after discharge: Pt reports that he is homeless.  Pt reports that he can not go to the shelter "because I have medications that have to be refrigerated". Will patient be returning to same living situation after discharge?: No  Summary/Recommendations:   Summary and Recommendations (to be completed by the evaluator): Patient is a 53 year old male from [place], [place] [address] [place]).   He presents to the hospital following concerns for depression and suicidal ideation without plan.  He has a primary diagnosis of Major Depressive Disorder.  Recommendations include: crisis  stabilization, therapeutic milieu, encourage group attendance and participation, medication management for mood stabilization and development of comprehensive mental wellness plan.  Harden Mo. 03/24/2019

## 2019-03-24 NOTE — BHH Counselor (Signed)
CSW received a return call from [employer] who reports that "potential" means that application for Medicaid has not been completed.    Cody Campbell reports "he was interviewed at [facility] but they do not see a 12 month disability so an application was not completed."  [name], MSW, LCSW 03/24/2019 2:09 PM

## 2019-03-24 NOTE — Progress Notes (Signed)
Admission Note:  53 yr male who presents Voluntary admission for in no acute distress for the treatment of SI and Depression. Patient appears blunted, and sad he endorsed  Depression.  Patient was initially irritable but with some emotional support he was calm and cooperative with admission process. Patient denies SI/HI/AVH and contracts for safety upon admission, Patient explained he experienced worsening depression and he is homeless, and he feels like a burden on people he cant go to shelter because its full of COVID.  Patient has Hx of Depression, HTN, DM, with complication, HTN and AKI. Patient's skin was assessed and found to be clear of any abnormal marks, he uses a walker for unsteady gait, he was also searched and no contraband found, POC and unit policies explained,  understanding verbalized and consents obtained. Patient is a brittle diabetic upon arrival blood sugar was 233, writer withheld given more insulin because per report his blood sugar are very erratic and unstable, will continue to monitor and recheck in the morning.

## 2019-03-24 NOTE — Plan of Care (Signed)
  Problem: Education: Goal: Knowledge of [facility] Education information/materials will improve Outcome: Not Progressing Goal: Emotional status will improve Outcome: Not Progressing Goal: Mental status will improve Outcome: Not Progressing Goal: Verbalization of understanding the information provided will improve Outcome: Not Progressing   Problem: Activity: Goal: Interest or engagement in activities will improve Outcome: Not Progressing Goal: Sleeping patterns will improve Outcome: Not Progressing   Problem: Coping: Goal: Ability to verbalize frustrations and anger appropriately will improve Outcome: Not Progressing Goal: Ability to demonstrate self-control will improve Outcome: Not Progressing   Problem: Coping: Goal: Ability to verbalize frustrations and anger appropriately will improve Outcome: Not Progressing Goal: Ability to demonstrate self-control will improve Outcome: Not Progressing   Problem: Health Behavior/Discharge Planning: Goal: Identification of resources available to assist in meeting health care needs will improve Outcome: Not Progressing Goal: Compliance with treatment plan for underlying cause of condition will improve Outcome: Not Progressing

## 2019-03-24 NOTE — BHH Counselor (Signed)
Pt reports that he does not need aftrecare referrals at this time.  [name], MSW, LCSW 03/24/2019 1:40 PM

## 2019-03-24 NOTE — Progress Notes (Signed)
Recreation Therapy Notes    Date: 03/24/2019  Time: 9:30 am   Location: Craft room   Behavioral response: N/A   Intervention Topic: Leisure  Discussion/Intervention: Patient did not attend group.   Clinical Observations/Feedback:  Patient did not attend group.   Sung [place] LRT/CTRS          Cathlyn Tersigni 03/24/2019 12:00 PM

## 2019-03-24 NOTE — Progress Notes (Signed)
Inpatient Diabetes Program Recommendations  AACE/ADA: New Consensus Statement on Inpatient Glycemic Control (2015)  Target Ranges:  Prepandial:   less than 140 mg/dL      Peak postprandial:   less than 180 mg/dL (1-2 hours)      Critically ill patients:  140 - 180 mg/dL   Results for Cody Campbell, Cody Campbell (MRN [id number]) as of 03/24/2019 10:07  Ref. Range 03/23/2019 09:01 03/23/2019 12:34 03/23/2019 15:58 03/23/2019 19:43 03/23/2019 20:11 03/23/2019 20:28 03/23/2019 22:39  Glucose-Capillary Latest Ref Range: 70 - 99 mg/dL 92 110 (H)    45 units LANTUS given at 1:49pm 182 (H)  3 units NOVOLOG  50 (L) 25 (LL) 78 193 (H)   Results for Cody Campbell, Cody Campbell (MRN [id number]) as of 03/24/2019 10:07  Ref. Range 03/24/2019 01:28 03/24/2019 06:45 03/24/2019 07:52  Glucose-Capillary Latest Ref Range: 70 - 99 mg/dL 223 (H) 62 (L) 92    Admitted 10/17 with orthostatic hypotension, acute kidney injury and volume depletion--patient had feelings of depression and suicidal ideation and psychiatry was consulted and recommended in-house psychiatric placement  History: DM, Depression, Homelessness  Home DM Meds: Lantus 35 units Daily       Novolog 6 units TID with meals  Current Orders: Novolog Moderate Correction Scale/ SSI (0-15 units) Q4 hours    Note patient had Severe Hypoglycemia last PM and mild Hypoglycemia this AM.  Lantus has been stopped (patient received 45 units Lantus yesterday at 1:49pm).  Will follow patient while in hospital.  May need some Lantus added back but do not recommend restart of Lantus yet.     --Will follow patient during hospitalization--  Wyn Quaker RN, MSN, CDE Diabetes Coordinator Inpatient Glycemic Control Team Team Pager: [phone] (8a-5p)

## 2019-03-24 NOTE — BHH Suicide Risk Assessment (Signed)
[facility] Admission Suicide Risk Assessment   Nursing information obtained from:  Patient Demographic factors:  Male Current Mental Status:  Suicidal ideation indicated by others Loss Factors:  Financial problems / change in socioeconomic status, Decrease in vocational status Historical Factors:  NA Risk Reduction Factors:  NA  Total Time spent with patient: 1 hour Principal Problem: Adjustment disorder with mixed anxiety and depressed mood Diagnosis:  Principal Problem:   Adjustment disorder with mixed anxiety and depressed mood Active Problems:   DM (diabetes mellitus), type 2, uncontrolled (HCC)   Hypertension   Diabetic neuropathy ([place])  Subjective Data: Patient with multiple medical problems and severe social disability allegedly made statements about being suicidal.  Currently denying any suicidal ideation.  Minimizing psychiatric symptoms.  No past history of suicide attempts.  No history of psychosis.  Continued Clinical Symptoms:  Alcohol Use Disorder Identification Test Final Score (AUDIT): 9 The "Alcohol Use Disorders Identification Test", Guidelines for Use in Primary Care, Second Edition.  World Pharmacologist [facility]). Score between 0-7:  no or low risk or alcohol related problems. Score between 8-15:  moderate risk of alcohol related problems. Score between 16-19:  high risk of alcohol related problems. Score 20 or above:  warrants further diagnostic evaluation for alcohol dependence and treatment.   CLINICAL FACTORS:   Depression:   Insomnia   Musculoskeletal: Strength & Muscle Tone: within normal limits Gait & Station: normal Patient leans: N/A  Psychiatric Specialty Exam: Physical Exam  Nursing note and vitals reviewed. Constitutional: He appears well-developed and well-nourished.  HENT:  Head: Normocephalic and atraumatic.  Eyes: Pupils are equal, round, and reactive to light. Conjunctivae are normal.  Neck: Normal range of motion.  Cardiovascular:  Regular rhythm and normal heart sounds.  Respiratory: Effort normal. No respiratory distress.  GI: Soft.  Musculoskeletal: Normal range of motion.  Neurological: He is alert.  Skin: Skin is warm and dry.  Psychiatric: His speech is normal. His mood appears anxious. He is agitated. He is not aggressive. Thought content is paranoid. Thought content is not delusional. Cognition and memory are normal. He expresses impulsivity. He expresses no homicidal and no suicidal ideation.    Review of Systems  Constitutional: Negative.   HENT: Negative.   Eyes: Negative.   Respiratory: Negative.   Cardiovascular: Negative.   Gastrointestinal: Negative.   Musculoskeletal: Negative.   Skin: Negative.   Neurological: Negative.   Psychiatric/Behavioral: Negative for depression and suicidal ideas.    Blood pressure 105/64, pulse (!) 116, temperature 98.4 F (36.9 C), temperature source Oral, resp. rate 18, height 6\' 2"  (1.88 m), weight 74.8 kg, SpO2 100 %.Body mass index is 21.17 kg/m.  General Appearance: Casual  Eye Contact:  Good  Speech:  Clear and Coherent  Volume:  Increased  Mood:  Dysphoric  Affect:  Congruent  Thought Process:  Coherent  Orientation:  Full (Time, Place, and Person)  Thought Content:  Logical  Suicidal Thoughts:  No  Homicidal Thoughts:  No  Memory:  Immediate;   Fair Recent;   Fair Remote;   Fair  Judgement:  Fair  Insight:  Fair  Psychomotor Activity:  Normal  Concentration:  Concentration: Fair  Recall:  [employer] of Knowledge:  Fair  Language:  Fair  Akathisia:  No  Handed:  Right  AIMS (if indicated):     Assets:  Communication Skills Desire for Improvement  ADL's:  Intact  Cognition:  WNL  Sleep:  Number of Hours: 4  COGNITIVE FEATURES THAT CONTRIBUTE TO RISK:  Polarized thinking    SUICIDE RISK:   Mild:  Suicidal ideation of limited frequency, intensity, duration, and specificity.  There are no identifiable plans, no associated intent, mild  dysphoria and related symptoms, good self-control (both objective and subjective assessment), few other risk factors, and identifiable protective factors, including available and accessible social support.  PLAN OF CARE: Continue 15-minute checks.  Engage in individual and group therapy.  Assessed by treatment team.  Patient strongly opposes medication.  We will defer that for now.  Work with patient on appropriate discharge planning.  I certify that inpatient services furnished can reasonably be expected to improve the patient's condition.   Mordecai Rasmussen, MD 03/24/2019, 5:35 PM

## 2019-03-25 LAB — GLUCOSE, CAPILLARY
Glucose-Capillary: 307 mg/dL — ABNORMAL HIGH (ref 70–99)
Glucose-Capillary: 107 mg/dL — ABNORMAL HIGH (ref 70–99)
Glucose-Capillary: 166 mg/dL — ABNORMAL HIGH (ref 70–99)
Glucose-Capillary: 300 mg/dL — ABNORMAL HIGH (ref 70–99)
Glucose-Capillary: 398 mg/dL — ABNORMAL HIGH (ref 70–99)
Glucose-Capillary: 232 mg/dL — ABNORMAL HIGH (ref 70–99)

## 2019-03-25 MED ORDER — INSULIN GLARGINE 100 UNIT/ML ~~LOC~~ SOLN
35.0000 [IU] | Freq: Every day | SUBCUTANEOUS | Status: DC
  Administered 2019-03-25: 35 [IU] via SUBCUTANEOUS
  Filled 2019-03-25 (×2): qty 0.35

## 2019-03-25 MED ORDER — INSULIN ASPART 100 UNIT/ML ~~LOC~~ SOLN
0.0000 [IU] | Freq: Three times a day (TID) | SUBCUTANEOUS | Status: DC
  Administered 2019-03-25 – 2019-03-26 (×2): 9 [IU] via SUBCUTANEOUS
  Administered 2019-03-26: 3 [IU] via SUBCUTANEOUS
  Filled 2019-03-25: qty 1

## 2019-03-25 NOTE — Progress Notes (Signed)
Patient in dayroom eating meal, continues with sitter for safety. In no apparent distress.

## 2019-03-25 NOTE — Progress Notes (Signed)
[facility]  03/25/2019 1:31 PM Cody PeonAvery D. [name]  MRN:  [id number] Subjective: Follow-up for this patient with adjustment disorder with multiple medical and social problems.  Patient is irritable and cranky today with many complaints.  He complains that his insulin was not prescribed correctly.  He also requested to be on regular diet.  Patient complains that no one has called his son.  I asked him why he had not called his son he said that his son would not answer a phone number if he did not know it which seems contradictory to his request that we call the son.  Patient continues to deny suicidal or homicidal ideation or psychotic symptoms.  Blood sugars have been somewhat labile and so has his blood pressure with some episodes of near fall from orthostasis Principal Problem: Adjustment disorder with mixed anxiety and depressed mood Diagnosis: Principal Problem:   Adjustment disorder with mixed anxiety and depressed mood Active Problems:   DM (diabetes mellitus), type 2, uncontrolled (HCC)   Hypertension   Diabetic neuropathy (HCC)  Total Time spent with patient: 30 minutes  Past Psychiatric History: Past history of multiple medical problems minimizes any psychiatric illness no prior hospitalizations  Past Medical History:  Past Medical History:  Diagnosis Date  . Diabetes mellitus without complication (HCC)   . Elevated LFTs 05/21/2011  . Hyperlipidemia   . Hypertension    History reviewed. No pertinent surgical history. Family History:  Family History  Problem Relation Age of Onset  . Diabetes Mother   . Hypertension Father   . Diabetes Father   . Heart disease Father    Family Psychiatric  History: See previous Social History:  Social History   Substance and Sexual Activity  Alcohol Use Yes  . Alcohol/week: 1.0 standard drinks  . Types: 1 Shots of liquor per week   Comment: occasionally, 1 beer a day     Social History   Substance and Sexual Activity  Drug  Use Yes  . Types: Marijuana    Social History   Socioeconomic History  . Marital status: Married    Spouse name: Not on file  . Number of children: Not on file  . Years of education: Not on file  . Highest education level: Not on file  Occupational History  . Not on file  Social Needs  . Financial resource strain: Not on file  . Food insecurity    Worry: Not on file    Inability: Not on file  . Transportation needs    Medical: Not on file    Non-medical: Not on file  Tobacco Use  . Smoking status: Current Every Day Smoker    Packs/day: 2.00    Years: 35.00    Pack years: 70.00    Types: Cigars  . Smokeless tobacco: Current User  . Tobacco comment: Smoking 1-2 small cigars daily  Substance and Sexual Activity  . Alcohol use: Yes    Alcohol/week: 1.0 standard drinks    Types: 1 Shots of liquor per week    Comment: occasionally, 1 beer a day  . Drug use: Yes    Types: Marijuana  . Sexual activity: Yes    Partners: Female    Birth control/protection: Condom  Lifestyle  . Physical activity    Days per week: Not on file    Minutes per session: Not on file  . Stress: Not on file  Relationships  . Social connections    Talks on phone: Not on  file    Gets together: Not on file    Attends religious service: Not on file    Active member of club or organization: Not on file    Attends meetings of clubs or organizations: Not on file    Relationship status: Not on file  Other Topics Concern  . Not on file  Social History Narrative  . Not on file   Additional Social History:                         Sleep: Fair  Appetite:  Fair  Current Medications: Current Facility-Administered Medications  Medication Dose Route Frequency Provider Last Rate Last Dose  . alum & mag hydroxide-simeth (MAALOX/MYLANTA) 200-200-20 MG/5ML suspension 30 mL  30 mL Oral Q4H PRN Caroline Sauger, NP      . fluticasone (FLONASE) 50 MCG/ACT nasal spray 2 spray  2 spray Each Nare  Daily Clapacs, Madie Reno, MD   2 spray at 03/24/19 1506  . gabapentin (NEURONTIN) tablet 600 mg  600 mg Oral TID Clapacs, Madie Reno, MD   600 mg at 03/25/19 1154  . hydrOXYzine (ATARAX/VISTARIL) tablet 25 mg  25 mg Oral Q6H PRN Caroline Sauger, NP      . insulin aspart (novoLOG) injection 0-9 Units  0-9 Units Subcutaneous TID WC Clapacs, John T, MD      . insulin glargine (LANTUS) injection 35 Units  35 Units Subcutaneous QHS Clapacs, John T, MD      . magnesium hydroxide (MILK OF MAGNESIA) suspension 30 mL  30 mL Oral Daily PRN Caroline Sauger, NP      . oxymetazoline (AFRIN) 0.05 % nasal spray 1 spray  1 spray Each Nare BID Clapacs, Madie Reno, MD   1 spray at 03/25/19 0813    Lab Results:  Results for orders placed or performed during the hospital encounter of 03/23/19 (from the past 48 hour(s))  Glucose, capillary     Status: Abnormal   Collection Time: 03/23/19 10:39 PM  Result Value Ref Range   Glucose-Capillary 193 (H) 70 - 99 mg/dL   Comment 1 Notify RN   Glucose, capillary     Status: Abnormal   Collection Time: 03/24/19  1:28 AM  Result Value Ref Range   Glucose-Capillary 223 (H) 70 - 99 mg/dL   Comment 1 Notify RN   Glucose, capillary     Status: Abnormal   Collection Time: 03/24/19  6:45 AM  Result Value Ref Range   Glucose-Capillary 62 (L) 70 - 99 mg/dL   Comment 1 Notify RN   Glucose, capillary     Status: None   Collection Time: 03/24/19  7:52 AM  Result Value Ref Range   Glucose-Capillary 92 70 - 99 mg/dL   Comment 1 Notify RN   Glucose, capillary     Status: Abnormal   Collection Time: 03/24/19 11:24 AM  Result Value Ref Range   Glucose-Capillary 294 (H) 70 - 99 mg/dL   Comment 1 Notify RN   Glucose, capillary     Status: Abnormal   Collection Time: 03/24/19  4:31 PM  Result Value Ref Range   Glucose-Capillary 303 (H) 70 - 99 mg/dL  Glucose, capillary     Status: Abnormal   Collection Time: 03/24/19  7:28 PM  Result Value Ref Range   Glucose-Capillary 22  (LL) 70 - 99 mg/dL   Comment 1 Notify RN   Glucose, capillary     Status: Abnormal   Collection  Time: 03/24/19  8:42 PM  Result Value Ref Range   Glucose-Capillary 144 (H) 70 - 99 mg/dL   Comment 1 Notify RN   Glucose, capillary     Status: Abnormal   Collection Time: 03/25/19  7:03 AM  Result Value Ref Range   Glucose-Capillary 307 (H) 70 - 99 mg/dL   Comment 1 Notify RN   Glucose, capillary     Status: Abnormal   Collection Time: 03/25/19  9:46 AM  Result Value Ref Range   Glucose-Capillary 107 (H) 70 - 99 mg/dL  Glucose, capillary     Status: Abnormal   Collection Time: 03/25/19 11:09 AM  Result Value Ref Range   Glucose-Capillary 166 (H) 70 - 99 mg/dL   Comment 1 Notify RN     Blood Alcohol level:  No results found for: [facility]  Metabolic Disorder Labs: Lab Results  Component Value Date   HGBA1C 10.0 (H) 03/18/2019   MPG 240 03/18/2019   No results found for: PROLACTIN Lab Results  Component Value Date   CHOL 189 01/25/2019   TRIG 82 01/25/2019   HDL 71 01/25/2019   CHOLHDL 2.7 01/25/2019   VLDL 11 05/15/2013   LDLCALC 102 (H) 01/25/2019   LDLCALC 80 05/15/2013    Physical Findings: AIMS:  , ,  ,  ,    CIWA:    COWS:     Musculoskeletal: Strength & Muscle Tone: within normal limits Gait & Station: unsteady Patient leans: N/A  Psychiatric Specialty Exam: Physical Exam  Nursing note and vitals reviewed. Constitutional: He appears well-developed and well-nourished.  HENT:  Head: Normocephalic and atraumatic.  Eyes: Pupils are equal, round, and reactive to light. Conjunctivae are normal.  Neck: Normal range of motion.  Cardiovascular: Regular rhythm and normal heart sounds.  Respiratory: Effort normal. No respiratory distress.  GI: Soft.  Musculoskeletal: Normal range of motion.  Neurological: He is alert.  Skin: Skin is warm and dry.  Psychiatric: His speech is normal. Judgment and thought content normal. His affect is angry. He is agitated. He is not  aggressive. Cognition and memory are normal.    Review of Systems  Constitutional: Negative.   HENT: Negative.   Eyes: Negative.   Respiratory: Negative.   Cardiovascular: Negative.   Gastrointestinal: Negative.   Musculoskeletal: Negative.   Skin: Negative.   Neurological: Negative.   Psychiatric/Behavioral: Negative for depression, hallucinations, memory loss, substance abuse and suicidal ideas. The patient is not nervous/anxious and does not have insomnia.     Blood pressure (!) 82/67, pulse (!) 119, temperature 98.5 F (36.9 C), temperature source Oral, resp. rate 18, height 6\' 2"  (1.88 m), weight 74.8 kg, SpO2 (!) 78 %.Body mass index is 21.17 kg/m.  General Appearance: Casual  Eye Contact:  Fair  Speech:  Clear and Coherent  Volume:  Increased  Mood:  Irritable  Affect:  Congruent  Thought Process:  Goal Directed  Orientation:  Full (Time, Place, and Person)  Thought Content:  Logical  Suicidal Thoughts:  No  Homicidal Thoughts:  No  Memory:  Immediate;   Fair Recent;   Poor Remote;   Fair  Judgement:  Fair  Insight:  Fair  Psychomotor Activity:  Decreased  Concentration:  Concentration: Fair  Recall:  of Knowledge:  Fair  Language:  Fair  Akathisia:  No  Handed:  Right  AIMS (if indicated):     Assets:  Desire for Improvement  ADL's:  Intact  Cognition:  WNL  Sleep:  Number of Hours: 7.15     Treatment Plan Summary: Daily contact with patient to assess and evaluate symptoms and progress in treatment, Medication management and Plan Listened to the patient and offered support of comments.  Agreed to add the Lantus 35 mg at bedtime which she says is his usual dose and then to adjust the sliding scale to be at a lower level by his request.  Changed to regular diet.  We will continue monitoring blood sugars routinely.  Nursing felt more comfortable having the patient on a one-to-one because of falls risk which seems appropriate and is so ordered.  No  other change to current plan we are still hopefully working on some sort of discharge plan at some point.  Mordecai Rasmussen, MD 03/25/2019, 1:31 PM

## 2019-03-25 NOTE — Progress Notes (Signed)
Patient remains with sitter 1:1 for safety, in no apparent distress.

## 2019-03-25 NOTE — Progress Notes (Signed)
Outside with group, remains with sitter for safety

## 2019-03-25 NOTE — Plan of Care (Signed)
  Problem: Education: Goal: Emotional status will improve Outcome: Progressing   Problem: Education: Goal: Mental status will improve Outcome: Progressing   Problem: Coping: Goal: Ability to verbalize frustrations and anger appropriately will improve Outcome: Progressing

## 2019-03-25 NOTE — BHH Group Notes (Signed)
LCSW Group Therapy Note  Date/Time:  03/25/2019   1:00PM  Type of Therapy and Topic:  Group Therapy:  Healthy vs Unhealthy Coping Skills  Participation Level:  Did Not Attend   Description of Group:  The focus of this group was to determine what unhealthy coping techniques typically are used by group members and what healthy coping techniques would be helpful in coping with various problems. Patients were guided in becoming aware of the differences between healthy and unhealthy coping techniques.  Patients were asked to identify 1 unhealthy coping skill they used prior to this hospitalization. Patients were then asked to identify 1-2 healthy coping skills they like to use, and many mentioned listening to music, coloring and taking a hot shower. These were further explored on how to implement them more effectively after discharge.   At the end of group, additional ideas of healthy coping skills were shared in discussion.   Therapeutic Goals 1. Patients learned that coping is what human beings do all day long to deal with various situations in their lives 2. Patients defined and discussed healthy vs unhealthy coping techniques 3. Patients identified their preferred coping techniques and identified whether these were healthy or unhealthy 4. Patients determined 1-2 healthy coping skills they would like to become more familiar with and use more often, and practiced a few meditations 5. Patients provided support and ideas to each other  Summary of Patient Progress: Patient did not attend group.    Therapeutic Modalities Cognitive Behavioral Therapy Motivational Interviewing Solution Focused Therapy Brief Therapy    Sherrice Creekmore, MSW, LCSW Clinical Social Work 03/25/2019   

## 2019-03-25 NOTE — Progress Notes (Signed)
Patient in room with sitter for safety, in no apparent distress.

## 2019-03-25 NOTE — Plan of Care (Signed)
Patient mood is improved today, he reports his "mental health part is better but his medical is not as good". He remains on 1:1 sitter for safety. He is cooperative and medication compliant. He appears to be in bed resting quietly.

## 2019-03-25 NOTE — Progress Notes (Signed)
Called into patient room, reports not feeling well. States was in shower and everything went black on him. Reports when standing he feels dizzy as though he will pass at. Patient reports near syncopal epidsode while in he shower. Patient does have a history of orthostatic hypotension. Positive at present. Patient reports being a brittle diabetic. Was unsure if blood pressure or blood sugar was the cause. Patient ambulates with walker with unsteady gait. Placed with 1:1 sitter for safety. Patient continues to endorse depression, but denies SI. Reports he is homeless and was denied Unemployment by his former employer. Patient reports needing housing placement and therapy to cope.   Encouragement and support offered. Safety checks maintained. Patient with sitter for safety. Medications given as prescribed.  Patient receptive and remains safe on unit with q 15 min checks.

## 2019-03-25 NOTE — Progress Notes (Signed)
Care taken over at 0100 , no issues and distress noted, seemed to rest well through out the night.  

## 2019-03-25 NOTE — Progress Notes (Signed)
In group remains 1:1 with sitter for safety.

## 2019-03-25 NOTE — Progress Notes (Signed)
Patient in milieu in no distress. Continues with 1:1 for safety.

## 2019-03-25 NOTE — Progress Notes (Signed)
Patient at nurses station with safety sitter in no distress.

## 2019-03-25 NOTE — Progress Notes (Signed)
Patient outside with staff and peers in no distress. Remains with sitter for safety.

## 2019-03-26 LAB — GLUCOSE, CAPILLARY
Glucose-Capillary: 268 mg/dL — ABNORMAL HIGH (ref 70–99)
Glucose-Capillary: 223 mg/dL — ABNORMAL HIGH (ref 70–99)
Glucose-Capillary: 417 mg/dL — ABNORMAL HIGH (ref 70–99)
Glucose-Capillary: 224 mg/dL — ABNORMAL HIGH (ref 70–99)
Glucose-Capillary: 265 mg/dL — ABNORMAL HIGH (ref 70–99)

## 2019-03-26 MED ORDER — INSULIN ASPART 100 UNIT/ML ~~LOC~~ SOLN
0.0000 [IU] | Freq: Three times a day (TID) | SUBCUTANEOUS | Status: DC
  Administered 2019-03-26 – 2019-03-27 (×2): 5 [IU] via SUBCUTANEOUS
  Administered 2019-03-27: 17:00:00 3 [IU] via SUBCUTANEOUS
  Administered 2019-03-27: 5 [IU] via SUBCUTANEOUS
  Administered 2019-03-28: 15 [IU] via SUBCUTANEOUS
  Filled 2019-03-26 (×5): qty 1

## 2019-03-26 MED ORDER — INSULIN GLARGINE 100 UNIT/ML ~~LOC~~ SOLN
40.0000 [IU] | Freq: Every day | SUBCUTANEOUS | Status: DC
  Administered 2019-03-26: 40 [IU] via SUBCUTANEOUS
  Filled 2019-03-26 (×3): qty 0.4

## 2019-03-26 NOTE — Progress Notes (Signed)
Patient in dayroom socializing with peers in no distress. Remains with sitter 1:1

## 2019-03-26 NOTE — Progress Notes (Signed)
Patient in bed resting eyes closed. Sitter at bedside for safety. 

## 2019-03-26 NOTE — Progress Notes (Signed)
[facility] MD Progress Note  03/26/2019 11:51 AM Denny Peon D. Vandeberg  MRN:  [id number] Subjective: Follow-up for this gentleman with mood instability recently and expressed suicidal ideation.  Patient continues to deny any suicidal thoughts today.  Mood is stated as being improved.  More optimistic.  Still uncertain about where he will be going after discharge.  Still needs to get in touch with his son.  Physically he is feeling about the same perhaps a little more energetic.  We reviewed his blood sugars and they are staying consistently elevated.  We agreed on increasing his insulin by a little bit.  No indication at this point for adding any psychiatric medicine.  Patient remains intermittently orthostatic but is managing it and has avoided having any falls. Principal Problem: Adjustment disorder with mixed anxiety and depressed mood Diagnosis: Principal Problem:   Adjustment disorder with mixed anxiety and depressed mood Active Problems:   DM (diabetes mellitus), type 2, uncontrolled (HCC)   Hypertension   Diabetic neuropathy (HCC)  Total Time spent with patient: 30 minutes  Past Psychiatric History: Past history of some dysphoric mood and anxiety  Past Medical History:  Past Medical History:  Diagnosis Date  . Diabetes mellitus without complication (HCC)   . Elevated LFTs 05/21/2011  . Hyperlipidemia   . Hypertension    History reviewed. No pertinent surgical history. Family History:  Family History  Problem Relation Age of Onset  . Diabetes Mother   . Hypertension Father   . Diabetes Father   . Heart disease Father    Family Psychiatric  History: See previous Social History:  Social History   Substance and Sexual Activity  Alcohol Use Yes  . Alcohol/week: 1.0 standard drinks  . Types: 1 Shots of liquor per week   Comment: occasionally, 1 beer a day     Social History   Substance and Sexual Activity  Drug Use Yes  . Types: Marijuana    Social History   Socioeconomic  History  . Marital status: Married    Spouse name: Not on file  . Number of children: Not on file  . Years of education: Not on file  . Highest education level: Not on file  Occupational History  . Not on file  Social Needs  . Financial resource strain: Not on file  . Food insecurity    Worry: Not on file    Inability: Not on file  . Transportation needs    Medical: Not on file    Non-medical: Not on file  Tobacco Use  . Smoking status: Current Every Day Smoker    Packs/day: 2.00    Years: 35.00    Pack years: 70.00    Types: Cigars  . Smokeless tobacco: Current User  . Tobacco comment: Smoking 1-2 small cigars daily  Substance and Sexual Activity  . Alcohol use: Yes    Alcohol/week: 1.0 standard drinks    Types: 1 Shots of liquor per week    Comment: occasionally, 1 beer a day  . Drug use: Yes    Types: Marijuana  . Sexual activity: Yes    Partners: Female    Birth control/protection: Condom  Lifestyle  . Physical activity    Days per week: Not on file    Minutes per session: Not on file  . Stress: Not on file  Relationships  . Social Musician on phone: Not on file    Gets together: Not on file    Attends religious service:  Not on file    Active member of club or organization: Not on file    Attends meetings of clubs or organizations: Not on file    Relationship status: Not on file  Other Topics Concern  . Not on file  Social History Narrative  . Not on file   Additional Social History:                         Sleep: Fair  Appetite:  Fair  Current Medications: Current Facility-Administered Medications  Medication Dose Route Frequency Provider Last Rate Last Dose  . alum & mag hydroxide-simeth (MAALOX/MYLANTA) 200-200-20 MG/5ML suspension 30 mL  30 mL Oral Q4H PRN Gillermo Murdoch, NP      . fluticasone (FLONASE) 50 MCG/ACT nasal spray 2 spray  2 spray Each Nare Daily Yeiren Whitecotton, Jackquline Denmark, MD   2 spray at 03/24/19 1506  . gabapentin  (NEURONTIN) tablet 600 mg  600 mg Oral TID Mckayla Mulcahey, Jackquline Denmark, MD   600 mg at 03/26/19 1137  . hydrOXYzine (ATARAX/VISTARIL) tablet 25 mg  25 mg Oral Q6H PRN Gillermo Murdoch, NP      . insulin aspart (novoLOG) injection 0-15 Units  0-15 Units Subcutaneous TID WC Aleysha Meckler T, MD      . insulin glargine (LANTUS) injection 40 Units  40 Units Subcutaneous QHS Andi Mahaffy T, MD      . magnesium hydroxide (MILK OF MAGNESIA) suspension 30 mL  30 mL Oral Daily PRN Gillermo Murdoch, NP      . oxymetazoline (AFRIN) 0.05 % nasal spray 1 spray  1 spray Each Nare BID Oronde Hallenbeck, Jackquline Denmark, MD   1 spray at 03/26/19 [id number]    Lab Results:  Results for orders placed or performed during the hospital encounter of 03/23/19 (from the past 48 hour(s))  Glucose, capillary     Status: Abnormal   Collection Time: 03/24/19  4:31 PM  Result Value Ref Range   Glucose-Capillary 303 (H) 70 - 99 mg/dL  Glucose, capillary     Status: Abnormal   Collection Time: 03/24/19  7:28 PM  Result Value Ref Range   Glucose-Capillary 22 (LL) 70 - 99 mg/dL   Comment 1 Notify RN   Glucose, capillary     Status: Abnormal   Collection Time: 03/24/19  8:42 PM  Result Value Ref Range   Glucose-Capillary 144 (H) 70 - 99 mg/dL   Comment 1 Notify RN   Glucose, capillary     Status: Abnormal   Collection Time: 03/25/19  7:03 AM  Result Value Ref Range   Glucose-Capillary 307 (H) 70 - 99 mg/dL   Comment 1 Notify RN   Glucose, capillary     Status: Abnormal   Collection Time: 03/25/19  9:46 AM  Result Value Ref Range   Glucose-Capillary 107 (H) 70 - 99 mg/dL  Glucose, capillary     Status: Abnormal   Collection Time: 03/25/19 11:09 AM  Result Value Ref Range   Glucose-Capillary 166 (H) 70 - 99 mg/dL   Comment 1 Notify RN   Glucose, capillary     Status: Abnormal   Collection Time: 03/25/19  2:27 PM  Result Value Ref Range   Glucose-Capillary 300 (H) 70 - 99 mg/dL  Glucose, capillary     Status: Abnormal   Collection Time:  03/25/19  4:05 PM  Result Value Ref Range   Glucose-Capillary 398 (H) 70 - 99 mg/dL   Comment 1 Notify RN  Glucose, capillary     Status: Abnormal   Collection Time: 03/25/19  8:36 PM  Result Value Ref Range   Glucose-Capillary 232 (H) 70 - 99 mg/dL   Comment 1 Notify RN   Glucose, capillary     Status: Abnormal   Collection Time: 03/26/19  4:00 AM  Result Value Ref Range   Glucose-Capillary 268 (H) 70 - 99 mg/dL   Comment 1 Notify RN   Glucose, capillary     Status: Abnormal   Collection Time: 03/26/19  6:56 AM  Result Value Ref Range   Glucose-Capillary 223 (H) 70 - 99 mg/dL   Comment 1 Notify RN   Glucose, capillary     Status: Abnormal   Collection Time: 03/26/19 11:32 AM  Result Value Ref Range   Glucose-Capillary 417 (H) 70 - 99 mg/dL   Comment 1 Notify RN     Blood Alcohol level:  No results found for: [facility]  Metabolic Disorder Labs: Lab Results  Component Value Date   HGBA1C 10.0 (H) 03/18/2019   MPG 240 03/18/2019   No results found for: PROLACTIN Lab Results  Component Value Date   CHOL 189 01/25/2019   TRIG 82 01/25/2019   HDL 71 01/25/2019   CHOLHDL 2.7 01/25/2019   VLDL 11 05/15/2013   LDLCALC 102 (H) 01/25/2019   LDLCALC 80 05/15/2013    Physical Findings: AIMS:  , ,  ,  ,    CIWA:    COWS:     Musculoskeletal: Strength & Muscle Tone: within normal limits Gait & Station: unsteady Patient leans: N/A  Psychiatric Specialty Exam: Physical Exam  Nursing note and vitals reviewed. Constitutional: He appears well-developed and well-nourished.  HENT:  Head: Normocephalic and atraumatic.  Eyes: Pupils are equal, round, and reactive to light. Conjunctivae are normal.  Neck: Normal range of motion.  Cardiovascular: Regular rhythm and normal heart sounds.  Respiratory: Effort normal. No respiratory distress.  GI: Soft.  Musculoskeletal: Normal range of motion.  Neurological: He is alert.  Skin: Skin is warm and dry.  Psychiatric: He has a normal  mood and affect. His speech is normal and behavior is normal. Judgment and thought content normal. Cognition and memory are normal.    Review of Systems  Constitutional: Negative.   HENT: Negative.   Eyes: Negative.   Respiratory: Negative.   Cardiovascular: Negative.   Gastrointestinal: Negative.   Musculoskeletal: Negative.   Skin: Negative.   Neurological: Negative.   Psychiatric/Behavioral: Negative.     Blood pressure (!) 145/75, pulse 82, temperature 98 F (36.7 C), temperature source Oral, resp. rate 18, height 6\' 2"  (1.88 m), weight 74.8 kg, SpO2 100 %.Body mass index is 21.17 kg/m.  General Appearance: Casual  Eye Contact:  Good  Speech:  Normal Rate  Volume:  Normal  Mood:  Euthymic  Affect:  Congruent  Thought Process:  Goal Directed  Orientation:  Full (Time, Place, and Person)  Thought Content:  Logical  Suicidal Thoughts:  No  Homicidal Thoughts:  No  Memory:  Immediate;   Fair Recent;   Fair Remote;   Fair  Judgement:  Fair  Insight:  Fair  Psychomotor Activity:  Decreased  Concentration:  Concentration: Fair  Recall:  [employer]Fair  Fund of Knowledge:  Fair  Language:  Fair  Akathisia:  No  Handed:  Right  AIMS (if indicated):     Assets:  Desire for Improvement Resilience  ADL's:  Intact  Cognition:  WNL  Sleep:  Number of Hours:  4.25     Treatment Plan Summary: Daily contact with patient to assess and evaluate symptoms and progress in treatment, Medication management and Plan Increased insulin by increasing Lantus to 40 mg and changing the sliding scale to the more aggressive "moderate" sliding scale.  No addition of psychiatric medicine.  Supportive counseling completed.  Reviewed plan with patient.  We are still working on trying to find a safe discharge plan.  Alethia Berthold, MD 03/26/2019, 11:51 AM

## 2019-03-26 NOTE — Progress Notes (Signed)
Patient in room eyes open with sitter for safety at bedside. Writer continues to encourage patient to wait for sitter before standing and ambulating

## 2019-03-26 NOTE — Progress Notes (Signed)
Patient in medication room with nurse getting medications. Continues with sitter for safety.

## 2019-03-26 NOTE — Progress Notes (Cosign Needed)
Patient in dayroom eating breakfast, 1:1 sitter for safety. No complaints. Reports feeling better.

## 2019-03-26 NOTE — Progress Notes (Signed)
Patient in bed resting eyes open in no distress. Sitter at bedside.

## 2019-03-26 NOTE — Progress Notes (Signed)
Patient in dayroom watching television with peers. Continues with sitter for safety in no distress.

## 2019-03-26 NOTE — Progress Notes (Signed)
Patient continues in dayroom watching television socializing with peers. Remains on 1:1 with no distress

## 2019-03-26 NOTE — Progress Notes (Signed)
With satety sitter in room in no distress.

## 2019-03-26 NOTE — Progress Notes (Signed)
Patient in room in bed eyes open, sitter at bedside for safety.

## 2019-03-26 NOTE — Progress Notes (Signed)
1:1 observation 1900-2300 D: Patient has been pleasant and cooperative. Denies SI, HI and AVH. On 1:1 for safety due to unsteady gait and patient being brittle diabetic. Mood is pleasant. Affect is appropriate to circumstance. Voices no complaints. Contracting for safety A: Continue 1:1 for safety R: Safety maintained. 2300-0200 D: Resting in bed with eyes closed. Staff at arm's length. A: continue 1:1 for safety R: safety maintained. 0200-0700 D: continues to be slightly unsteady. Pleasant and cooperative. Denies SI, HI and AVH A:continue 1:1 for safety R: Safety maintained.

## 2019-03-26 NOTE — Progress Notes (Signed)
Patient currently with sitter for safety. CBG elevated, MD notified of level. Order to give current dosage and continue to monitor. Patient currently asymptomatic in no distress.

## 2019-03-26 NOTE — Progress Notes (Signed)
Patient in bed resting eyes closed in no distress. Sitter at bedside for safety.

## 2019-03-26 NOTE — Plan of Care (Signed)
  Problem: Education: Goal: Knowledge of [place] [facility] Education information/materials will improve Outcome: Progressing Goal: Emotional status will improve Outcome: Progressing Goal: Mental status will improve Outcome: Progressing Goal: Verbalization of understanding the information provided will improve Outcome: Progressing  D: Patient has been pleasant and cooperative. Denies SI, HI and AVH. On 1:1 for safety due to unsteady gait and patient being brittle diabetic. Mood is pleasant. Affect is appropriate to circumstance. Voices no complaints. Contracting for safety A: Continue 1:1 for safety R: Safety maintained.

## 2019-03-26 NOTE — [facility] Group Notes (Signed)
LCSW Group Therapy Note 03/26/2019 1:15pm  Type of Therapy and Topic: Group Therapy: Feelings Around Returning Home & Establishing a Supportive Framework and Supporting Oneself When Supports Not Available  Participation Level: Did Not Attend  Description of Group:  Patients first processed thoughts and feelings about upcoming discharge. These included fears of upcoming changes, lack of change, new living environments, judgements and expectations from others and overall stigma of mental health issues. The group then discussed the definition of a supportive framework, what that looks and feels like, and how do to discern it from an unhealthy non-supportive network. The group identified different types of supports as well as what to do when your family/friends are less than helpful or unavailable  Therapeutic Goals  1. Patient will identify one healthy supportive network that they can use at discharge. 2. Patient will identify one factor of a supportive framework and how to tell it from an unhealthy network. 3. Patient able to identify one coping skill to use when they do not have positive supports from others. 4. Patient will demonstrate ability to communicate their needs through discussion and/or role plays.  Summary of Patient Progress:  Patient was invited to group, however she did not attend.   Therapeutic Modalities Cognitive Behavioral Therapy Motivational Interviewing   Cody Demore  CUEBAS-COLON, LCSW 03/26/2019 10:20 AM

## 2019-03-26 NOTE — Plan of Care (Signed)
  Problem: Education: Goal: Knowledge of [facility] Education information/materials will improve Outcome: Progressing Goal: Emotional status will improve Outcome: Progressing Goal: Mental status will improve Outcome: Progressing Goal: Verbalization of understanding the information provided will improve Outcome: Progressing   Problem: Activity: Goal: Interest or engagement in activities will improve Outcome: Progressing Goal: Sleeping patterns will improve Outcome: Progressing   

## 2019-03-27 ENCOUNTER — Ambulatory Visit: Payer: Self-pay | Admitting: Internal Medicine

## 2019-03-27 LAB — GLUCOSE, CAPILLARY
Glucose-Capillary: 167 mg/dL — ABNORMAL HIGH (ref 70–99)
Glucose-Capillary: 191 mg/dL — ABNORMAL HIGH (ref 70–99)
Glucose-Capillary: 201 mg/dL — ABNORMAL HIGH (ref 70–99)
Glucose-Capillary: 233 mg/dL — ABNORMAL HIGH (ref 70–99)
Glucose-Capillary: 311 mg/dL — ABNORMAL HIGH (ref 70–99)
Glucose-Capillary: 36 mg/dL — CL (ref 70–99)
Glucose-Capillary: 374 mg/dL — ABNORMAL HIGH (ref 70–99)
Glucose-Capillary: 52 mg/dL — ABNORMAL LOW (ref 70–99)

## 2019-03-27 MED ORDER — INSULIN ASPART 100 UNIT/ML ~~LOC~~ SOLN
5.0000 [IU] | Freq: Three times a day (TID) | SUBCUTANEOUS | Status: DC
  Administered 2019-03-27: 5 [IU] via SUBCUTANEOUS
  Filled 2019-03-27: qty 1

## 2019-03-27 NOTE — Plan of Care (Signed)
Patient is worried about his homelessness and concern about his orthostatic hypotension.Denies SI,HI and AVH.Ambulates with good.Appropriate with staff & peers.Compliant with medications.Attended groups.Appetite and energy level good.Safety sitter with patient.

## 2019-03-27 NOTE — Progress Notes (Signed)
Patient is ambulating with walker.Safety sitter with patient.Appropriate with staff & peers.Patient attending group at this time.

## 2019-03-27 NOTE — Progress Notes (Signed)
Inpatient Diabetes Program Recommendations  AACE/ADA: New Consensus Statement on Inpatient Glycemic Control (2015)  Target Ranges:  Prepandial:   less than 140 mg/dL      Peak postprandial:   less than 180 mg/dL (1-2 hours)      Critically ill patients:  140 - 180 mg/dL   Results for Cody Campbell, Cody Campbell (MRN [id number]) as of 03/27/2019 08:35  Ref. Range 03/26/2019 06:56 03/26/2019 11:32 03/26/2019 16:28 03/26/2019 21:03  Glucose-Capillary Latest Ref Range: 70 - 99 mg/dL 223 (H)  3 units NOVOLOG  417 (H)  9 units NOVOLOG  224 (H)  5 units NOVOLOG  265 (H)    40 units LANTUS   Results for Cody Campbell, Cody Campbell (MRN [id number]) as of 03/27/2019 08:35  Ref. Range 03/27/2019 06:58  Glucose-Capillary Latest Ref Range: 70 - 99 mg/dL 201 (H)  5 units NOVOLOG       Admitted 10/17 to [facility] with orthostatic hypotension, acute kidney injury and volume depletion--patient had feelings of depression and suicidal ideation and psychiatry was consulted and recommended in-house psychiatric placement  History: DM, Depression, Homelessness   Home DM Meds: Lantus 35 units Daily        Novolog 6 units TID   Current Orders: Lantus 40 units QHS      Novolog Moderate Correction Scale/ SSI (0-15 units) TID AC    PCP: [facility] (gets Insulin at [place])      MD- Please consider the following in-hospital insulin adjustments:  1. Increase Lantus further to 45 units QHS  2. Start Novolog Meal Coverage:  Novolog 6 units TID with meals  (Please add the following Hold Parameters: Hold if pt eats <50% of meal, Hold if pt NPO)     --Will follow patient during hospitalization--  Wyn Quaker RN, MSN, CDE Diabetes Coordinator Inpatient Glycemic Control Team Team Pager: [phone] (8a-5p)

## 2019-03-27 NOTE — Progress Notes (Signed)
Patient is responding appropriately and asymptomatic , a repeat  blood sugar at [facility] time is 167 , intervention treatment is effective noted. No distress.

## 2019-03-27 NOTE — Progress Notes (Signed)
Patient is alert and responding appropriately coherent, blood sugar check this evening is critically low at 36 , patient is asymptomatic at this time, high carb snacks and orange juice  is provided, and recheck in 15 minutes, Grandville Silos NP is notified noted.

## 2019-03-27 NOTE — Progress Notes (Signed)
Recreation Therapy Notes    Date: 03/27/2019  Time: 9:30 am  Location: Craft room  Behavioral response: Appropriate   Intervention Topic: Happiness  Discussion/Intervention:  Group content today was focused on Happiness. The group defined happiness and described where happiness comes from. Individuals identified what makes them happy and how they go about making others happy. Patients expressed things that stop them from being happy and ways they can improve their happiness. The group stated reasons why it is important to be happy. The group participated in the intervention "My Happiness", where they had a chance to identify and express things that make them happy. Clinical Observations/Feedback:  Patient came to group and defined happiness as peace,love and serenity. He stated that happiness comes from within your self. Participant expressed external triggers keeps him from being happy. Patient explained that happiness is important for self fulfillment. Individual was social with peers and staff while participating in group.  Cody Campbell LRT/CTRS         Zykira Matlack 03/27/2019 11:32 AM

## 2019-03-27 NOTE — [facility] Group Notes (Signed)
Overcoming Obstacles  03/27/2019 1PM  Type of Therapy and Topic:  Group Therapy:  Overcoming Obstacles  Participation Level:  Active    Description of Group:    In this group patients will be encouraged to explore what they see as obstacles to their own wellness and recovery. They will be guided to discuss their thoughts, feelings, and behaviors related to these obstacles. The group will process together ways to cope with barriers, with attention given to specific choices patients can make. Each patient will be challenged to identify changes they are motivated to make in order to overcome their obstacles. This group will be process-oriented, with patients participating in exploration of their own experiences as well as giving and receiving support and challenge from other group members.   Therapeutic Goals: 1. Patient will identify personal and current obstacles as they relate to admission. 2. Patient will identify barriers that currently interfere with their wellness or overcoming obstacles.  3. Patient will identify feelings, thought process and behaviors related to these barriers. 4. Patient will identify two changes they are willing to make to overcome these obstacles:      Summary of Patient Progress Group member given worksheet on 10 ways to develop resilience. Pt identified increase in smoking cigars and eating junk food as a "bad habit" that occurs when he is stressed. Pt demonstrated very good insight and was receptive to feedback from members on healthy ways to overcome his obstacles.    Therapeutic Modalities:   Cognitive Behavioral Therapy Solution Focused Therapy Motivational Interviewing Relapse Prevention Therapy    Sanjuana Kava, MSW, LCSW 03/27/2019 1:45 PM

## 2019-03-27 NOTE — Progress Notes (Signed)
Patient resting in bed.No issues verbalized.Safety sitter at bedside.

## 2019-03-27 NOTE — Progress Notes (Signed)
[facility] MD Progress Note  03/27/2019 4:24 PM Cody Campbell  MRN:  [id number] Subjective: Follow-up for this patient with anxiety and depression.  On interview today the patient has no specific complaints about his mood.  Not feeling particularly depressed.  He is able to interact with staff and patients entirely appropriately.  He is not expressing any suicidal thoughts hopelessness sadness and there is no sign of any psychosis.  Patient continues to be focused primarily on his medical conditions which are certainly significant.  His blood sugars continue to range in the mid 200s up to 300.  He still has episodes of orthostatic hypotension.  Patient suggested to me that I try texting his son Cody Campbell as a way to reach him rather than telephoning.  I did this and the son actually did respond and I spoke to him this afternoon.  I did not get the feeling that there was much chance that he was going to be able to let his dad live with him.  He was very anxious about it and said he would need to talk to his wife.  We decided that higher of the social workers would call back in the next day or so to see if there is any update. Principal Problem: Adjustment disorder with mixed anxiety and depressed mood Diagnosis: Principal Problem:   Adjustment disorder with mixed anxiety and depressed mood Active Problems:   DM (diabetes mellitus), type 2, uncontrolled (HCC)   Hypertension   Diabetic neuropathy (HCC)  Total Time spent with patient: 30 minutes  Past Psychiatric History: No significant past psychiatric history  Past Medical History:  Past Medical History:  Diagnosis Date  . Diabetes mellitus without complication (HCC)   . Elevated LFTs 05/21/2011  . Hyperlipidemia   . Hypertension    History reviewed. No pertinent surgical history. Family History:  Family History  Problem Relation Age of Onset  . Diabetes Mother   . Hypertension Father   . Diabetes Father   . Heart disease Father    Family  Psychiatric  History: See previous Social History:  Social History   Substance and Sexual Activity  Alcohol Use Yes  . Alcohol/week: 1.0 standard drinks  . Types: 1 Shots of liquor per week   Comment: occasionally, 1 beer a day     Social History   Substance and Sexual Activity  Drug Use Yes  . Types: Marijuana    Social History   Socioeconomic History  . Marital status: Married    Spouse name: Not on file  . Number of children: Not on file  . Years of education: Not on file  . Highest education level: Not on file  Occupational History  . Not on file  Social Needs  . Financial resource strain: Not on file  . Food insecurity    Worry: Not on file    Inability: Not on file  . Transportation needs    Medical: Not on file    Non-medical: Not on file  Tobacco Use  . Smoking status: Current Every Day Smoker    Packs/day: 2.00    Years: 35.00    Pack years: 70.00    Types: Cigars  . Smokeless tobacco: Current User  . Tobacco comment: Smoking 1-2 small cigars daily  Substance and Sexual Activity  . Alcohol use: Yes    Alcohol/week: 1.0 standard drinks    Types: 1 Shots of liquor per week    Comment: occasionally, 1 beer a day  . Drug  use: Yes    Types: Marijuana  . Sexual activity: Yes    Partners: Female    Birth control/protection: Condom  Lifestyle  . Physical activity    Days per week: Not on file    Minutes per session: Not on file  . Stress: Not on file  Relationships  . Social Musician on phone: Not on file    Gets together: Not on file    Attends religious service: Not on file    Active member of club or organization: Not on file    Attends meetings of clubs or organizations: Not on file    Relationship status: Not on file  Other Topics Concern  . Not on file  Social History Narrative  . Not on file   Additional Social History:                         Sleep: Fair  Appetite:  Fair  Current Medications: Current  Facility-Administered Medications  Medication Dose Route Frequency Provider Last Rate Last Dose  . alum & mag hydroxide-simeth (MAALOX/MYLANTA) 200-200-20 MG/5ML suspension 30 mL  30 mL Oral Q4H PRN Gillermo Murdoch, NP   30 mL at 03/27/19 0815  . fluticasone (FLONASE) 50 MCG/ACT nasal spray 2 spray  2 spray Each Nare Daily Jaidynn Balster, Jackquline Denmark, MD   2 spray at 03/24/19 1506  . gabapentin (NEURONTIN) tablet 600 mg  600 mg Oral TID Feleshia Zundel T, MD   600 mg at 03/27/19 1146  . hydrOXYzine (ATARAX/VISTARIL) tablet 25 mg  25 mg Oral Q6H PRN Gillermo Murdoch, NP      . insulin aspart (novoLOG) injection 0-15 Units  0-15 Units Subcutaneous TID WC Kennedie Pardoe, Jackquline Denmark, MD   5 Units at 03/27/19 1136  . insulin aspart (novoLOG) injection 5 Units  5 Units Subcutaneous TID WC Shaquil Aldana T, MD      . insulin glargine (LANTUS) injection 40 Units  40 Units Subcutaneous QHS Ladavia Lindenbaum, Jackquline Denmark, MD   40 Units at 03/26/19 2139  . magnesium hydroxide (MILK OF MAGNESIA) suspension 30 mL  30 mL Oral Daily PRN Gillermo Murdoch, NP      . oxymetazoline (AFRIN) 0.05 % nasal spray 1 spray  1 spray Each Nare BID Caliyah Sieh, Jackquline Denmark, MD   1 spray at 03/27/19 [phone]    Lab Results:  Results for orders placed or performed during the hospital encounter of 03/23/19 (from the past 48 hour(s))  Glucose, capillary     Status: Abnormal   Collection Time: 03/25/19  8:36 PM  Result Value Ref Range   Glucose-Capillary 232 (H) 70 - 99 mg/dL   Comment 1 Notify RN   Glucose, capillary     Status: Abnormal   Collection Time: 03/26/19  4:00 AM  Result Value Ref Range   Glucose-Capillary 268 (H) 70 - 99 mg/dL   Comment 1 Notify RN   Glucose, capillary     Status: Abnormal   Collection Time: 03/26/19  6:56 AM  Result Value Ref Range   Glucose-Capillary 223 (H) 70 - 99 mg/dL   Comment 1 Notify RN   Glucose, capillary     Status: Abnormal   Collection Time: 03/26/19 11:32 AM  Result Value Ref Range   Glucose-Capillary 417 (H) 70  - 99 mg/dL   Comment 1 Notify RN   Glucose, capillary     Status: Abnormal   Collection Time: 03/26/19  4:28 PM  Result  Value Ref Range   Glucose-Capillary 224 (H) 70 - 99 mg/dL   Comment 1 Notify RN   Glucose, capillary     Status: Abnormal   Collection Time: 03/26/19  9:03 PM  Result Value Ref Range   Glucose-Capillary 265 (H) 70 - 99 mg/dL   Comment 1 Notify RN   Glucose, capillary     Status: Abnormal   Collection Time: 03/27/19 12:35 AM  Result Value Ref Range   Glucose-Capillary 374 (H) 70 - 99 mg/dL   Comment 1 Notify RN   Glucose, capillary     Status: Abnormal   Collection Time: 03/27/19  4:01 AM  Result Value Ref Range   Glucose-Capillary 311 (H) 70 - 99 mg/dL   Comment 1 Notify RN   Glucose, capillary     Status: Abnormal   Collection Time: 03/27/19  6:58 AM  Result Value Ref Range   Glucose-Capillary 201 (H) 70 - 99 mg/dL   Comment 1 Notify RN   Glucose, capillary     Status: Abnormal   Collection Time: 03/27/19 11:10 AM  Result Value Ref Range   Glucose-Capillary 233 (H) 70 - 99 mg/dL    Blood Alcohol level:  [facility] results found for: [facility]  Metabolic Disorder Labs: Lab Results  Component Value Date   HGBA1C 10.0 (H) 03/18/2019   MPG 240 03/18/2019   No results found for: PROLACTIN Lab Results  Component Value Date   CHOL 189 01/25/2019   TRIG 82 01/25/2019   HDL 71 01/25/2019   CHOLHDL 2.7 01/25/2019   VLDL 11 05/15/2013   LDLCALC 102 (H) 01/25/2019   LDLCALC 80 05/15/2013    Physical Findings: AIMS:  , ,  ,  ,    CIWA:    COWS:     Musculoskeletal: Strength & Muscle Tone: within normal limits Gait & Station: normal Patient leans: N/A  Psychiatric Specialty Exam: Physical Exam  Nursing note and vitals reviewed. Constitutional: He appears well-developed and well-nourished.  HENT:  Head: Normocephalic and atraumatic.  Eyes: Pupils are equal, round, and reactive to light. Conjunctivae are normal.  Neck: Normal range of motion.   Cardiovascular: Regular rhythm and normal heart sounds.  Respiratory: Effort normal. No respiratory distress.  GI: Soft.  Musculoskeletal: Normal range of motion.  Neurological: He is alert.  Skin: Skin is warm and dry.  Psychiatric: He has a normal mood and affect. His behavior is normal. Judgment and thought content normal.    Review of Systems  Constitutional: Negative.   HENT: Negative.   Eyes: Negative.   Respiratory: Negative.   Cardiovascular: Negative.   Gastrointestinal: Negative.   Musculoskeletal: Negative.   Skin: Negative.   Neurological: Negative.   Psychiatric/Behavioral: Negative.     Blood pressure 105/83, pulse (!) 109, temperature 98.7 F (37.1 C), temperature source Oral, resp. rate 18, height 6\' 2"  (1.88 m), weight 74.8 kg, SpO2 100 %.Body mass index is 21.17 kg/m.  General Appearance: Casual  Eye Contact:  Fair  Speech:  Normal Rate  Volume:  Normal  Mood:  Euthymic  Affect:  Congruent  Thought Process:  Goal Directed  Orientation:  Full (Time, Place, and Person)  Thought Content:  Logical  Suicidal Thoughts:  No  Homicidal Thoughts:  No  Memory:  Immediate;   Fair Recent;   Fair Remote;   Fair  Judgement:  Fair  Insight:  Fair  Psychomotor Activity:  Normal  Concentration:  Concentration: Fair  Recall:  FiservFair  Fund of Knowledge:  Fair  Language:  Fair  Akathisia:  No  Handed:  Right  AIMS (if indicated):     Assets:  Desire for Improvement  ADL's:  Impaired  Cognition:  WNL  Sleep:  Number of Hours: 5.5     Treatment Plan Summary: Daily contact with patient to assess and evaluate symptoms and progress in treatment, Medication management and Plan Patient with some chronic disabilities and no place to live.  Patient very uncertain and unwilling to consider a shelter because of his illness.  In his defense this probably would be very difficult for him with his trouble ambulating.  Spoke to the son finally today who did not offer a lot of  hope.  On the plus side we can make some progress in his diabetes.  I am going to add standing short acting insulin with his meals.  After consultation with the nursing staff it appears we need to continue the sitter for now out of concern for him falling.  Alethia Berthold, MD 03/27/2019, 4:24 PM

## 2019-03-27 NOTE — [facility] Suicide Risk Assessment (Signed)
[facility] INPATIENT:  Family/Significant Other Suicide Prevention Education  Suicide Prevention Education:  Contact Attempts: Cody Campbell, son, [phone] has been identified by the patient as the family member/significant other with whom the patient will be residing, and identified as the person(s) who will aid the patient in the event of a mental health crisis.  With written consent from the patient, two attempts were made to provide suicide prevention education, prior to and/or following the patient's discharge.  We were unsuccessful in providing suicide prevention education.  A suicide education pamphlet was given to the patient to share with family/significant other.  Date and time of first attempt: 03/24/2019 at 1:41pm Date and time of second attempt: 03/27/2019 at 10:58AM  Cody Campbell 03/27/2019, 10:58 AM

## 2019-03-28 LAB — GLUCOSE, CAPILLARY
Glucose-Capillary: 371 mg/dL — ABNORMAL HIGH (ref 70–99)
Glucose-Capillary: 106 mg/dL — ABNORMAL HIGH (ref 70–99)
Glucose-Capillary: 303 mg/dL — ABNORMAL HIGH (ref 70–99)
Glucose-Capillary: 328 mg/dL — ABNORMAL HIGH (ref 70–99)

## 2019-03-28 MED ORDER — LOPERAMIDE HCL 2 MG PO CAPS
2.0000 mg | ORAL_CAPSULE | ORAL | Status: DC | PRN
  Administered 2019-03-28: 2 mg via ORAL
  Filled 2019-03-28 (×2): qty 1

## 2019-03-28 MED ORDER — INSULIN GLARGINE 100 UNIT/ML ~~LOC~~ SOLN
35.0000 [IU] | Freq: Every day | SUBCUTANEOUS | Status: DC
  Administered 2019-03-28 – 2019-03-31 (×3): 35 [IU] via SUBCUTANEOUS
  Filled 2019-03-28 (×7): qty 0.35

## 2019-03-28 MED ORDER — ONDANSETRON HCL 4 MG PO TABS: 4.0000 mg | ORAL_TABLET | Freq: Four times a day (QID) | ORAL | Status: DC | PRN

## 2019-03-28 MED ORDER — INSULIN ASPART 100 UNIT/ML ~~LOC~~ SOLN
0.0000 [IU] | Freq: Three times a day (TID) | SUBCUTANEOUS | Status: DC
  Administered 2019-03-28 – 2019-03-29 (×2): 9 [IU] via SUBCUTANEOUS
  Administered 2019-03-29: 3 [IU] via SUBCUTANEOUS
  Administered 2019-03-29: 5 [IU] via SUBCUTANEOUS
  Administered 2019-03-30: 2 [IU] via SUBCUTANEOUS
  Administered 2019-03-30 (×2): 3 [IU] via SUBCUTANEOUS
  Administered 2019-03-31: 17:00:00 5 [IU] via SUBCUTANEOUS
  Administered 2019-03-31: 3 [IU] via SUBCUTANEOUS
  Administered 2019-03-31: 2 [IU] via SUBCUTANEOUS
  Administered 2019-04-01 (×2): 7 [IU] via SUBCUTANEOUS
  Administered 2019-04-01: 1 [IU] via SUBCUTANEOUS
  Administered 2019-04-02: 7 [IU] via SUBCUTANEOUS
  Administered 2019-04-02: 17:00:00 2 [IU] via SUBCUTANEOUS
  Administered 2019-04-02 – 2019-04-03 (×3): 9 [IU] via SUBCUTANEOUS
  Filled 2019-03-28 (×14): qty 1

## 2019-03-28 MED ORDER — INSULIN ASPART 100 UNIT/ML ~~LOC~~ SOLN
3.0000 [IU] | Freq: Three times a day (TID) | SUBCUTANEOUS | Status: DC
  Administered 2019-03-28 – 2019-04-03 (×18): 3 [IU] via SUBCUTANEOUS
  Filled 2019-03-28 (×17): qty 1

## 2019-03-28 MED ORDER — LOPERAMIDE HCL 2 MG PO CAPS
4.0000 mg | ORAL_CAPSULE | ORAL | Status: DC | PRN
  Administered 2019-03-28: 17:00:00 4 mg via ORAL
  Filled 2019-03-28: qty 2

## 2019-03-28 NOTE — Progress Notes (Signed)
Recreation Therapy Notes          Wyatt Thorstenson 03/28/2019 11:00 AM

## 2019-03-28 NOTE — Progress Notes (Signed)
Inpatient Diabetes Program Recommendations  AACE/ADA: New Consensus Statement on Inpatient Glycemic Control (2015)  Target Ranges:  Prepandial:   less than 140 mg/dL      Peak postprandial:   less than 180 mg/dL (1-2 hours)      Critically ill patients:  140 - 180 mg/dL   Results for Cody Campbell, Cody Campbell (MRN [id number]) as of 03/28/2019 09:14  Ref. Range 03/26/2019 06:56 03/26/2019 11:32 03/26/2019 16:28 03/26/2019 21:03  Glucose-Capillary Latest Ref Range: 70 - 99 mg/dL 223 (H)  3 units NOVOLOG  417 (H)  9 units NOVOLOG  224 (H)  5 units NOVOLOG  265 (H)    40 units LANTUS   Results for Cody Campbell, Cody Campbell (MRN [id number]) as of 03/28/2019 09:14  Ref. Range 03/27/2019 00:35 03/27/2019 04:01 03/27/2019 06:58 03/27/2019 11:10 03/27/2019 16:29 03/27/2019 19:59 03/27/2019 20:59  Glucose-Capillary Latest Ref Range: 70 - 99 mg/dL 374 (H) 311 (H) 201 (H)  5 units NOVOLOG  233 (H)  5 units NOVOLOG  191 (H)  8 units NOVOLOG  36 (LL) 167 (H)   Results for Cody Campbell, Cody Campbell (MRN [id number]) as of 03/28/2019 09:14  Ref. Range 03/28/2019 06:57  Glucose-Capillary Latest Ref Range: 70 - 99 mg/dL 371 (H)  15 units NOVOLOG      Admitted 10/17 to [facility] with orthostatic hypotension, acute kidney injury and volume depletion--patient had feelings of depression and suicidal ideation and psychiatry was consulted and recommended in-house psychiatric placement  History: DM, Depression, Homelessness   Home DM Meds: Lantus 35 units Daily                              Novolog 6 units TID   Current Orders: Lantus 40 units QHS                            Novolog Moderate Correction Scale/ SSI (0-15 units) TID AC      Novolog 5 units TID with meals    PCP: [facility] (gets Insulin at [employer])     MD- Note that Novolog Meal Coverage started yesterday at [facility] with [facility].  Patient then had severe Hypoglycemic event at 8pm (CBG 36)  Lantus dose was HELD last PM.    CBG this AM severely  elevated to 371 because the Lantus was held.  Please consider the following:  1. Change Lantus to 40 units QAM and please make sure patient gets a dose this AM since he did Not get any Lantus last PM   2. Reduce Novolog SSI to the Sensitive scale (0-9 units) TID AC + HS   3. Reduce Novolog Meal Coverage to: Novolog 3 units TID with meals  (Please add the following Hold Parameters: Hold if pt eats <50% of meal, Hold if pt NPO)     --Will follow patient during hospitalization--  Wyn Quaker RN, MSN, CDE Diabetes Coordinator Inpatient Glycemic Control Team Team Pager: [phone] (8a-5p)

## 2019-03-28 NOTE — Progress Notes (Signed)
0745 Patient in room  With 1:1 affect cheerful on approach  Walking with walker  0815 Patient appetite good at breakfast  In dayroom with 1:1 Patient knowledgeable of information   received  regarding unit programing  able  to verbalize understanding . Patient attending unit  therapy groups . Mental and emotional status improving. Voice no concerns around sleep. No anger outburst noted  able to control behavior . Continue to walk with a walker  0900 Patient in room in bed  1:1 present   1000  Patient in bathroom 1:1 present   11:00  Patient in  Bed with eyes closed 1:1  Present   12:00 appetite good at dinner , remained  In dayroom  1:1  Present   1300 Patient in bathroom  Received medication for diarrhea  1:1  Present   1400 Patient in bedroom on bed  Voice no  Concerns  1:1  Present   1500 Patient in  Dayroom  With peers  Watching TV   1:1  Present  1600 Patient out side  1:1 present   1700 Patient in dayroom eating  1:1 present  1820 Patient complaints of gas pains  Rolling from mid to right side  , received Mylanta   With further complaints of nausea   Writer attempted to call Dr. Weber Cooks  At 39 1:1 Present    1828 Md return call patient  Patient place on Zofran 4 mg every 6 hours  For nausea  1900 Patient walked to dayroom and back with walker  Noted to throw  Walker into the room  Upset about being here.

## 2019-03-28 NOTE — [facility] Group Notes (Signed)
  LCSW Group Therapy Note  03/28/2019 10:50 AM   Type of Therapy/Topic:  Group Therapy:  Feelings about Diagnosis  Participation Level:  Did Not Attend   Description of Group:   This group will allow patients to explore their thoughts and feelings about diagnoses they have received. Patients will be guided to explore their level of understanding and acceptance of these diagnoses. Facilitator will encourage patients to process their thoughts and feelings about the reactions of others to their diagnosis and will guide patients in identifying ways to discuss their diagnosis with significant others in their lives. This group will be process-oriented, with patients participating in exploration of their own experiences, giving and receiving support, and processing challenge from other group members.   Therapeutic Goals: 1. Patient will demonstrate understanding of diagnosis as evidenced by identifying two or more symptoms of the disorder 2. Patient will be able to express two feelings regarding the diagnosis 3. Patient will demonstrate their ability to communicate their needs through discussion and/or role play  Summary of Patient Progress: x   Therapeutic Modalities:   Cognitive Behavioral Therapy Brief Therapy Feelings Identification    Evalina Field, MSW, LCSW Clinical Social Work 03/28/2019 10:50 AM

## 2019-03-28 NOTE — Tx Team (Addendum)
Interdisciplinary Treatment and Diagnostic Plan Update  03/28/2019 Time of Session: 900am Cody Campbell. Sesler MRN: [id number]  Principal Diagnosis: Adjustment disorder with mixed anxiety and depressed mood  Secondary Diagnoses: Principal Problem:   Adjustment disorder with mixed anxiety and depressed mood Active Problems:   DM (diabetes mellitus), type 2, uncontrolled ([place])   Hypertension   Diabetic neuropathy ([place])   Current Medications:  Current Facility-Administered Medications  Medication Dose Route Frequency Provider Last Rate Last Dose  . alum & mag hydroxide-simeth (MAALOX/MYLANTA) 200-200-20 MG/5ML suspension 30 mL  30 mL Oral Q4H PRN Caroline Sauger, NP   30 mL at 03/27/19 0815  . fluticasone (FLONASE) 50 MCG/ACT nasal spray 2 spray  2 spray Each Nare Daily Clapacs, Madie Reno, MD   2 spray at 03/28/19 0800  . gabapentin (NEURONTIN) tablet 600 mg  600 mg Oral TID Clapacs, John T, MD   600 mg at 03/28/19 0800  . hydrOXYzine (ATARAX/VISTARIL) tablet 25 mg  25 mg Oral Q6H PRN Caroline Sauger, NP      . insulin aspart (novoLOG) injection 0-9 Units  0-9 Units Subcutaneous TID WC Clapacs, John T, MD      . insulin aspart (novoLOG) injection 3 Units  3 Units Subcutaneous TID WC Clapacs, John T, MD      . insulin glargine (LANTUS) injection 35 Units  35 Units Subcutaneous Daily Clapacs, John T, MD      . loperamide (IMODIUM) capsule 2 mg  2 mg Oral PRN Clapacs, John T, MD      . magnesium hydroxide (MILK OF MAGNESIA) suspension 30 mL  30 mL Oral Daily PRN Caroline Sauger, NP      . oxymetazoline (AFRIN) 0.05 % nasal spray 1 spray  1 spray Each Nare BID Clapacs, Madie Reno, MD   1 spray at 03/28/19 0800   PTA Medications: Medications Prior to Admission  Medication Sig Dispense Refill Last Dose  . aspirin-sod bicarb-citric acid (ALKA-SELTZER) 325 MG TBEF tablet Take 325 mg by mouth daily as needed (for acid).     . ferrous sulfate (FERROUSUL) 325 (65 FE) MG tablet Take 1 tablet  (325 mg total) by mouth 2 (two) times daily with a meal. 60 tablet 3   . gabapentin (NEURONTIN) 300 MG capsule Take 2 capsules (600 mg total) by mouth 3 (three) times daily. 180 capsule 3   . hydrALAZINE (APRESOLINE) 25 MG tablet Take 1 tablet (25 mg total) by mouth every 6 (six) hours as needed (SBP greater than 170 mmHg). 6 tablet 0   . insulin aspart (NOVOLOG) 100 UNIT/ML injection Inject 0-12 Units into the skin 3 (three) times daily with meals. As per sliding scale (Patient taking differently: Inject 6 Units into the skin 3 (three) times daily with meals. As per sliding scale) 10 mL 3   . insulin glargine (LANTUS) 100 UNIT/ML injection Inject 0.4 mLs (40 Units total) into the skin 2 (two) times daily. (Patient taking differently: Inject 35 Units into the skin daily. ) 30 mL 3   . Insulin Syringe-Needle U-100 (B-D INS SYR ULTRAFINE 1CC/31G) 31G X 5/16" 1 ML MISC Use for administration of insulin. 100 each 12   . sertraline (ZOLOFT) 50 MG tablet Take 1 tablet (50 mg total) by mouth daily. 4 tablet 0     Patient Stressors:    Patient Strengths:    Treatment Modalities: Medication Management, Group therapy, Case management,  1 to 1 session with clinician, Psychoeducation, Recreational therapy.   Physician Treatment Plan for Primary  Diagnosis: Adjustment disorder with mixed anxiety and depressed mood Long Term Goal(s): Improvement in symptoms so as ready for discharge Improvement in symptoms so as ready for discharge   Short Term Goals: Ability to verbalize feelings will improve Ability to demonstrate self-control will improve Ability to identify and develop effective coping behaviors will improve Ability to maintain clinical measurements within normal limits will improve  Medication Management: Evaluate patient's response, side effects, and tolerance of medication regimen.  Therapeutic Interventions: 1 to 1 sessions, Unit Group sessions and Medication administration.  Evaluation of  Outcomes: Progressing  Physician Treatment Plan for Secondary Diagnosis: Principal Problem:   Adjustment disorder with mixed anxiety and depressed mood Active Problems:   DM (diabetes mellitus), type 2, uncontrolled ([facility])   Hypertension   Diabetic neuropathy ([facility])  Long Term Goal(s): Improvement in symptoms so as ready for discharge Improvement in symptoms so as ready for discharge   Short Term Goals: Ability to verbalize feelings will improve Ability to demonstrate self-control will improve Ability to identify and develop effective coping behaviors will improve Ability to maintain clinical measurements within normal limits will improve     Medication Management: Evaluate patient's response, side effects, and tolerance of medication regimen.  Therapeutic Interventions: 1 to 1 sessions, Unit Group sessions and Medication administration.  Evaluation of Outcomes: Progressing   RN Treatment Plan for Primary Diagnosis: Adjustment disorder with mixed anxiety and depressed mood Long Term Goal(s): Knowledge of disease and therapeutic regimen to maintain health will improve  Short Term Goals: Ability to verbalize frustration and anger appropriately will improve, Ability to participate in decision making will improve, Ability to disclose and discuss suicidal ideas and Ability to identify and develop effective coping behaviors will improve  Medication Management: RN will administer medications as ordered by provider, will assess and evaluate patient's response and provide education to patient for prescribed medication. RN will report any adverse and/or side effects to prescribing provider.  Therapeutic Interventions: 1 on 1 counseling sessions, Psychoeducation, Medication administration, Evaluate responses to treatment, Monitor vital signs and CBGs as ordered, Perform/monitor CIWA, COWS, AIMS and Fall Risk screenings as ordered, Perform wound care treatments as ordered.  Evaluation of Outcomes:  Progressing   LCSW Treatment Plan for Primary Diagnosis: Adjustment disorder with mixed anxiety and depressed mood Long Term Goal(s): Safe transition to appropriate next level of care at discharge, Engage patient in therapeutic group addressing interpersonal concerns.  Short Term Goals: Engage patient in aftercare planning with referrals and resources, Increase social support, Identify triggers associated with mental health/substance abuse issues and Increase skills for wellness and recovery  Therapeutic Interventions: Assess for all discharge needs, 1 to 1 time with Social worker, Explore available resources and support systems, Assess for adequacy in community support network, Educate family and significant other(s) on suicide prevention, Complete Psychosocial Assessment, Interpersonal group therapy.  Evaluation of Outcomes: Progressing   Progress in Treatment: Attending groups: Yes. Participating in groups: Yes. Taking medication as prescribed: Yes. Toleration medication: Yes. Family/Significant other contact made: No, will contact:  pts son Patient understands diagnosis: Yes. Discussing patient identified problems/goals with staff: Yes. Medical problems stabilized or resolved: Yes. Denies suicidal/homicidal ideation: Yes. Issues/concerns per patient self-inventory: No. Other: N/A  New problem(s) identified: No, Describe:  none  New Short Term/Long Term Goal(s): Detox, elimination of AVH/symptoms of psychosis, medication management for mood stabilization; elimination of SI thoughts; development of comprehensive mental wellness/sobriety plan.   Patient Goals:  "getting my health straight and get a roof over my head"  Discharge Plan or Barriers: SPE pamphlet, Mobile Crisis information, and AA/NA information provided to patient for additional community support and resources. Pt declined referral for mental health treatment reporting he does not need it. CSW assessing for appropriate  referrals.  Reason for Continuation of Hospitalization: Depression Medication stabilization  Estimated Length of Stay: 5-7 days  Recreational Therapy: Patient: N/A Patient Goal: Patient will engage in groups without prompting or encouragement from LRT x3 group sessions within 5 recreation therapy group sessions  Attendees: Patient: Stclair Szymborski 03/28/2019 10:40 AM  Physician: Dr Toni Amend MD 03/28/2019 10:40 AM  Nursing: Doyce Para RN 03/28/2019 10:40 AM  RN Care Manager: 03/28/2019 10:40 AM  Social Worker: Zollie Scale Moton LCSW 03/28/2019 10:40 AM  Recreational Therapist: Garret Reddish CTRS LRT 03/28/2019 10:40 AM  Other: Lowella Dandy LCSW  03/28/2019 10:40 AM  Other:  03/28/2019 10:40 AM  Other: 03/28/2019 10:40 AM    Scribe for Treatment Team: Charlann Lange Moton, LCSW 03/28/2019 10:40 AM

## 2019-03-28 NOTE — Progress Notes (Signed)
[facility] MD Progress Note  03/28/2019 10:14 AM Cody Campbell  MRN:  [id number] Subjective: Patient seen today.  He met with treatment team.  Patient remains frustrated at not having a discharge plan.  Treatment team empathized with him over the difficulties but reviewed how limited our options were.  I told him I spoke to his son yesterday and that his son was not willing to commit to taking him in although we will follow-up with him.  Patient's blood sugars remain poorly controlled.  Very high at 1 time and then very low at others.  Mood is irritable but not depressed not suicidal no evidence of psychosis. Principal Problem: Adjustment disorder with mixed anxiety and depressed mood Diagnosis: Principal Problem:   Adjustment disorder with mixed anxiety and depressed mood Active Problems:   DM (diabetes mellitus), type 2, uncontrolled ([facility])   Hypertension   Diabetic neuropathy ([place])  Total Time spent with patient: 30 minutes  Past Psychiatric History:   Past Medical History:  Past Medical History:  Diagnosis Date  . Diabetes mellitus without complication ([place])   . Elevated LFTs 05/21/2011  . Hyperlipidemia   . Hypertension    History reviewed. No pertinent surgical history. Family History:  Family History  Problem Relation Age of Onset  . Diabetes Mother   . Hypertension Father   . Diabetes Father   . Heart disease Father    Family Psychiatric  History: No significant family history Social History:  Social History   Substance and Sexual Activity  Alcohol Use Yes  . Alcohol/week: 1.0 standard drinks  . Types: 1 Shots of liquor per week   Comment: occasionally, 1 beer a day     Social History   Substance and Sexual Activity  Drug Use Yes  . Types: Marijuana    Social History   Socioeconomic History  . Marital status: Married    Spouse name: Not on file  . Number of children: Not on file  . Years of education: Not on file  . Highest education level: Not on file   Occupational History  . Not on file  Social Needs  . Financial resource strain: Not on file  . Food insecurity    Worry: Not on file    Inability: Not on file  . Transportation needs    Medical: Not on file    Non-medical: Not on file  Tobacco Use  . Smoking status: Current Every Day Smoker    Packs/day: 2.00    Years: 35.00    Pack years: 70.00    Types: Cigars  . Smokeless tobacco: Current User  . Tobacco comment: Smoking 1-2 small cigars daily  Substance and Sexual Activity  . Alcohol use: Yes    Alcohol/week: 1.0 standard drinks    Types: 1 Shots of liquor per week    Comment: occasionally, 1 beer a day  . Drug use: Yes    Types: Marijuana  . Sexual activity: Yes    Partners: Female    Birth control/protection: Condom  Lifestyle  . Physical activity    Days per week: Not on file    Minutes per session: Not on file  . Stress: Not on file  Relationships  . Social Herbalist on phone: Not on file    Gets together: Not on file    Attends religious service: Not on file    Active member of club or organization: Not on file    Attends meetings of clubs or  organizations: Not on file    Relationship status: Not on file  Other Topics Concern  . Not on file  Social History Narrative  . Not on file   Additional Social History:                         Sleep: Fair  Appetite:  Fair  Current Medications: Current Facility-Administered Medications  Medication Dose Route Frequency Provider Last Rate Last Dose  . alum & mag hydroxide-simeth (MAALOX/MYLANTA) 200-200-20 MG/5ML suspension 30 mL  30 mL Oral Q4H PRN Caroline Sauger, NP   30 mL at 03/27/19 0815  . fluticasone (FLONASE) 50 MCG/ACT nasal spray 2 spray  2 spray Each Nare Daily Clapacs, Madie Reno, MD   2 spray at 03/28/19 0800  . gabapentin (NEURONTIN) tablet 600 mg  600 mg Oral TID Clapacs, John T, MD   600 mg at 03/28/19 0800  . hydrOXYzine (ATARAX/VISTARIL) tablet 25 mg  25 mg Oral Q6H PRN  Caroline Sauger, NP      . insulin aspart (novoLOG) injection 0-15 Units  0-15 Units Subcutaneous TID WC Clapacs, Madie Reno, MD   15 Units at 03/28/19 0715  . insulin aspart (novoLOG) injection 5 Units  5 Units Subcutaneous TID WC Clapacs, Madie Reno, MD   5 Units at 03/27/19 1722  . insulin glargine (LANTUS) injection 40 Units  40 Units Subcutaneous QHS Clapacs, Madie Reno, MD   40 Units at 03/26/19 2139  . magnesium hydroxide (MILK OF MAGNESIA) suspension 30 mL  30 mL Oral Daily PRN Caroline Sauger, NP      . oxymetazoline (AFRIN) 0.05 % nasal spray 1 spray  1 spray Each Nare BID Clapacs, Madie Reno, MD   1 spray at 03/28/19 0800    Lab Results:  Results for orders placed or performed during the hospital encounter of 03/23/19 (from the past 48 hour(s))  Glucose, capillary     Status: Abnormal   Collection Time: 03/26/19 11:32 AM  Result Value Ref Range   Glucose-Capillary 417 (H) 70 - 99 mg/dL   Comment 1 Notify RN   Glucose, capillary     Status: Abnormal   Collection Time: 03/26/19  4:28 PM  Result Value Ref Range   Glucose-Capillary 224 (H) 70 - 99 mg/dL   Comment 1 Notify RN   Glucose, capillary     Status: Abnormal   Collection Time: 03/26/19  9:03 PM  Result Value Ref Range   Glucose-Capillary 265 (H) 70 - 99 mg/dL   Comment 1 Notify RN   Glucose, capillary     Status: Abnormal   Collection Time: 03/27/19 12:35 AM  Result Value Ref Range   Glucose-Capillary 374 (H) 70 - 99 mg/dL   Comment 1 Notify RN   Glucose, capillary     Status: Abnormal   Collection Time: 03/27/19  4:01 AM  Result Value Ref Range   Glucose-Capillary 311 (H) 70 - 99 mg/dL   Comment 1 Notify RN   Glucose, capillary     Status: Abnormal   Collection Time: 03/27/19  6:58 AM  Result Value Ref Range   Glucose-Capillary 201 (H) 70 - 99 mg/dL   Comment 1 Notify RN   Glucose, capillary     Status: Abnormal   Collection Time: 03/27/19 11:10 AM  Result Value Ref Range   Glucose-Capillary 233 (H) 70 - 99 mg/dL   Glucose, capillary     Status: Abnormal   Collection Time: 03/27/19  4:29  PM  Result Value Ref Range   Glucose-Capillary 191 (H) 70 - 99 mg/dL  Glucose, capillary     Status: Abnormal   Collection Time: 03/27/19  7:59 PM  Result Value Ref Range   Glucose-Capillary 36 (LL) 70 - 99 mg/dL  Glucose, capillary     Status: Abnormal   Collection Time: 03/27/19  8:59 PM  Result Value Ref Range   Glucose-Capillary 167 (H) 70 - 99 mg/dL  Glucose, capillary     Status: Abnormal   Collection Time: 03/28/19  6:57 AM  Result Value Ref Range   Glucose-Capillary 371 (H) 70 - 99 mg/dL   Comment 1 Notify RN     Blood Alcohol level:  No results found for: [facility]  Metabolic Disorder Labs: Lab Results  Component Value Date   HGBA1C 10.0 (H) 03/18/2019   MPG 240 03/18/2019   No results found for: PROLACTIN Lab Results  Component Value Date   CHOL 189 01/25/2019   TRIG 82 01/25/2019   HDL 71 01/25/2019   CHOLHDL 2.7 01/25/2019   VLDL 11 05/15/2013   LDLCALC 102 (H) 01/25/2019   LDLCALC 80 05/15/2013    Physical Findings: AIMS:  , ,  ,  ,    CIWA:    COWS:     Musculoskeletal: Strength & Muscle Tone: within normal limits Gait & Station: normal Patient leans: N/A  Psychiatric Specialty Exam: Physical Exam  Constitutional: He appears well-developed and well-nourished.  HENT:  Head: Normocephalic and atraumatic.  Eyes: Pupils are equal, round, and reactive to light. Conjunctivae are normal.  Neck: Normal range of motion.  Cardiovascular: Normal heart sounds.  Respiratory: Effort normal.  GI: Soft.  Musculoskeletal: Normal range of motion.  Neurological: He is alert.  Skin: Skin is warm and dry.  Psychiatric: He has a normal mood and affect. His speech is normal and behavior is normal. Judgment and thought content normal. Cognition and memory are normal.    Review of Systems  Constitutional: Negative.   HENT: Negative.   Eyes: Negative.   Respiratory: Negative.    Cardiovascular: Negative.   Gastrointestinal: Negative.   Musculoskeletal: Negative.   Skin: Negative.   Neurological: Negative.   Psychiatric/Behavioral: Negative.     Blood pressure (!) 91/59, pulse 97, temperature 97.9 F (36.6 C), temperature source Oral, resp. rate 18, height '6\' 2"'$  (1.88 m), weight 74.8 kg, SpO2 100 %.Body mass index is 21.17 kg/m.  General Appearance: Casual  Eye Contact:  Good  Speech:  Clear and Coherent  Volume:  Normal  Mood:  Irritable  Affect:  Appropriate  Thought Process:  Goal Directed  Orientation:  Full (Time, Place, and Person)  Thought Content:  Logical  Suicidal Thoughts:  No  Homicidal Thoughts:  No  Memory:  Immediate;   Fair Recent;   Fair Remote;   Fair  Judgement:  Fair  Insight:  Fair  Psychomotor Activity:  Normal  Concentration:  Concentration: Fair  Recall:  [employer] of Knowledge:  Fair  Language:  Fair  Akathisia:  No  Handed:  Right  AIMS (if indicated):     Assets:  Desire for Improvement  ADL's:  Impaired  Cognition:  WNL  Sleep:  Number of Hours: 0.5     Treatment Plan Summary: Daily contact with patient to assess and evaluate symptoms and progress in treatment, Medication management and Plan Patient continues to not display signs of acutely treatable psychiatric illness.  His discharge plan is still nowhere.  Patient insists  that he would be too sick to go to shelter which is probably correct.  He continues to have orthostatic blood pressure and his blood sugars are poorly controlled.  So no change to any psychiatric treatment such as it is.  I spoke with the diabetes coordinator this morning who made several suggestions specifically to change his long-acting insulin to a morning dose, to cut his mealtime coverage back to 3 units and to change his sliding scale to a lower dose more sensitive scale.  She will continue to follow-up with him.  I am not sure what else at this point we can do to treat the orthostasis.  He  may have to end up on medicines to increase his blood pressure if he stays this low.  Treatment team will continue to try and find some kind of discharge plan.  Alethia Berthold, MD 03/28/2019, 10:14 AM

## 2019-03-28 NOTE — Plan of Care (Signed)
Patient knowledgeable of information   received  regarding unit programing  able  to verbalize understanding . Patient attending unit  therapy groups . Mental and emotional status improving. Voice no concerns around sleep. No anger outburst noted  able to control behavior . Continue to walk with a walker    Problem: Safety: Goal: Periods of time without injury will increase Outcome: Progressing   Problem: Physical Regulation: Goal: Ability to maintain clinical measurements within normal limits will improve Outcome: Progressing   Problem: Health Behavior/Discharge Planning: Goal: Identification of resources available to assist in meeting health care needs will improve Outcome: Progressing Goal: Compliance with treatment plan for underlying cause of condition will improve Outcome: Progressing   Problem: Coping: Goal: Ability to verbalize frustrations and anger appropriately will improve Outcome: Progressing Goal: Ability to demonstrate self-control will improve Outcome: Progressing   Problem: Education: Goal: Knowledge of [place] [facility] Education information/materials will improve Outcome: Progressing Goal: Emotional status will improve Outcome: Progressing Goal: Mental status will improve Outcome: Progressing Goal: Verbalization of understanding the information provided will improve Outcome: Progressing   Problem: Activity: Goal: Interest or engagement in activities will improve Outcome: Progressing Goal: Sleeping patterns will improve Outcome: Progressing

## 2019-03-29 LAB — GLUCOSE, CAPILLARY
Glucose-Capillary: 233 mg/dL — ABNORMAL HIGH (ref 70–99)
Glucose-Capillary: 270 mg/dL — ABNORMAL HIGH (ref 70–99)
Glucose-Capillary: 428 mg/dL — ABNORMAL HIGH (ref 70–99)
Glucose-Capillary: >600 mg/dL (ref 70–99)

## 2019-03-29 MED ORDER — PANTOPRAZOLE SODIUM 40 MG PO TBEC
40.0000 mg | DELAYED_RELEASE_TABLET | Freq: Every day | ORAL | Status: DC
  Administered 2019-03-29 – 2019-04-07 (×10): 40 mg via ORAL
  Filled 2019-03-29 (×10): qty 1

## 2019-03-29 MED ORDER — INSULIN ASPART 100 UNIT/ML ~~LOC~~ SOLN
8.0000 [IU] | Freq: Once | SUBCUTANEOUS | Status: AC
  Administered 2019-03-29: 8 [IU] via SUBCUTANEOUS
  Filled 2019-03-29: qty 1

## 2019-03-29 MED ORDER — SIMETHICONE 80 MG PO CHEW: 80.0000 mg | CHEWABLE_TABLET | Freq: Four times a day (QID) | ORAL | Status: DC | PRN

## 2019-03-29 NOTE — Progress Notes (Signed)
Recreation Therapy Notes  Date: 03/29/2019  Time: 9:30 am  Location: Craft room  Behavioral response: Appropriate   Intervention Topic: Coping-Skills  Discussion/Intervention:  Group content on today was focused on coping skills. The group defined what coping skills are and when they can be used. Individuals described how they normally cope with thing and the coping skills they normally use. Patients expressed why it is important to cope with things and how not coping with things can affect you. The group participated in the intervention "My coping box" and made coping boxes while adding coping skills they could use in the future to the box. Clinical Observations/Feedback:  Patient came to group late and did not provide any positive contributions towards group.  Bernetha Anschutz LRT/CTRS          Denzal Meir 03/29/2019 11:33 AM

## 2019-03-29 NOTE — Progress Notes (Signed)
Adult ICU Glycemic Control Protocol Sidebar Report  Phase 1 Adult ICU Glycemic Control Protocol - Subcutaneous Insulin 1. Monitor CBG every 4 hours 2. Administer correction coverage with insulin aspart (NOVOLOG) subcutaneously every 4 hours according to the sliding scale 3. If CBG < 70 mg/dL refer to Hypoglycemia Standing Orders and Hypoglycemia Standing Orders Sidebar report a. In Order Management under Order Sets search for Hypoglycemia Standing Orders b. Place orders per Standing Orders: Cosign required 4. If there is one CBG > 250 mg/dL or two subsequent CBG > 200 mg/dL a. Search for Phase 2 Adult ICU Glycemic Control Protocol - IV Insulin in Order Management under Order Sets and place orders per Protocol: Cosign required. b. In Order Management select Active Orders and View by Order Set. Discontinue ALL orders from the Phase 1 Adult ICU Glycemic Control - Subcutaneous Insulin orderset per Protocol: Cosign required.   Patient's CBG was High and registered over 600. MD called and 8 units of insulin aspart (novoLOG) were given as well as 35 units of insulin glargine (LANTUS) per MD instructions. Patients blood sugar will be reassessed at 0200.

## 2019-03-29 NOTE — Progress Notes (Signed)
[facility] MD Progress Note  03/29/2019 4:23 PM Cody Campbell  MRN:  [id number]   Subjective:  Patient is seen in person for follow-up for this patient with anxiety and depression.  On interview today the patient remained focused on his housing and stated that this was his primary concern and focus.  "I feel like i'm just being thrown away, I cant go back to the shelters because Im a brittle diabetic and there is Covid.  My son heard a rumor that I was playing house with some woman and that is why he will not take me back". He is able to interact with staff and patients appropriately.  He denies suicidal ideation, homicidal ideation, as well as any auditory or visual hallucination at this time.  When asked about patients mobility he reports episodes of orthostatic hypotension, but is able to ambulate around the unit well with rolling walker at this time.   Principal Problem: Adjustment disorder with mixed anxiety and depressed mood Diagnosis: Principal Problem:   Adjustment disorder with mixed anxiety and depressed mood Active Problems:   DM (diabetes mellitus), type 2, uncontrolled (HCC)   Hypertension   Diabetic neuropathy (HCC)  Total Time spent with patient: 30 minutes  Past Psychiatric History: Patient claims to have no past psychiatric history.  Says he is never seen a therapist psychiatrist or mental health provider.  I mention to him that a note in the chart says that he had been prescribed an antidepressant medicine at one time.  Patient acknowledges that but says he did not take it for long did not follow-up and cannot remember what the name of it was.  He denies ever having tried to hurt himself in the past.  Denies any history of mania.  Denies any history anytime as an adult of abusing alcohol or drugs:      Past Medical History:  Past Medical History:  Diagnosis Date  . Diabetes mellitus without complication (HCC)   . Elevated LFTs 05/21/2011  . Hyperlipidemia   . Hypertension    History reviewed. No pertinent surgical history. Family History:  Family History  Problem Relation Age of Onset  . Diabetes Mother   . Hypertension Father   . Diabetes Father   . Heart disease Father    Family Psychiatric  History: No significant family history Social History:  Social History   Substance and Sexual Activity  Alcohol Use Yes  . Alcohol/week: 1.0 standard drinks  . Types: 1 Shots of liquor per week   Comment: occasionally, 1 beer a day     Social History   Substance and Sexual Activity  Drug Use Yes  . Types: Marijuana    Social History   Socioeconomic History  . Marital status: Married    Spouse name: Not on file  . Number of children: Not on file  . Years of education: Not on file  . Highest education level: Not on file  Occupational History  . Not on file  Social Needs  . Financial resource strain: Not on file  . Food insecurity    Worry: Not on file    Inability: Not on file  . Transportation needs    Medical: Not on file    Non-medical: Not on file  Tobacco Use  . Smoking status: Current Every Day Smoker    Packs/day: 2.00    Years: 35.00    Pack years: 70.00    Types: Cigars  . Smokeless tobacco: Current User  . Tobacco  comment: Smoking 1-2 small cigars daily  Substance and Sexual Activity  . Alcohol use: Yes    Alcohol/week: 1.0 standard drinks    Types: 1 Shots of liquor per week    Comment: occasionally, 1 beer a day  . Drug use: Yes    Types: Marijuana  . Sexual activity: Yes    Partners: Female    Birth control/protection: Condom  Lifestyle  . Physical activity    Days per week: Not on file    Minutes per session: Not on file  . Stress: Not on file  Relationships  . Social Herbalist on phone: Not on file    Gets together: Not on file    Attends religious service: Not on file    Active member of club or organization: Not on file    Attends meetings of clubs or organizations: Not on file    Relationship  status: Not on file  Other Topics Concern  . Not on file  Social History Narrative  . Not on file   Additional Social History:                         Sleep: Poor  Appetite:  Good  Current Medications: Current Facility-Administered Medications  Medication Dose Route Frequency Provider Last Rate Last Dose  . alum & mag hydroxide-simeth (MAALOX/MYLANTA) 200-200-20 MG/5ML suspension 30 mL  30 mL Oral Q4H PRN Caroline Sauger, NP   30 mL at 03/28/19 1802  . fluticasone (FLONASE) 50 MCG/ACT nasal spray 2 spray  2 spray Each Nare Daily Clapacs, Madie Reno, MD   2 spray at 03/28/19 0800  . gabapentin (NEURONTIN) tablet 600 mg  600 mg Oral TID Clapacs, John T, MD   600 mg at 03/29/19 1619  . hydrOXYzine (ATARAX/VISTARIL) tablet 25 mg  25 mg Oral Q6H PRN Caroline Sauger, NP      . insulin aspart (novoLOG) injection 0-9 Units  0-9 Units Subcutaneous TID WC Clapacs, Madie Reno, MD   5 Units at 03/29/19 1124  . insulin aspart (novoLOG) injection 3 Units  3 Units Subcutaneous TID WC Clapacs, Madie Reno, MD   3 Units at 03/29/19 1126  . insulin glargine (LANTUS) injection 35 Units  35 Units Subcutaneous Daily Clapacs, Madie Reno, MD   35 Units at 03/28/19 2153  . loperamide (IMODIUM) capsule 4 mg  4 mg Oral PRN Clapacs, Madie Reno, MD   4 mg at 03/28/19 1708  . magnesium hydroxide (MILK OF MAGNESIA) suspension 30 mL  30 mL Oral Daily PRN Caroline Sauger, NP      . ondansetron (ZOFRAN) tablet 4 mg  4 mg Oral Q6H PRN Clapacs, John T, MD      . oxymetazoline (AFRIN) 0.05 % nasal spray 1 spray  1 spray Each Nare BID Clapacs, Madie Reno, MD   1 spray at 03/29/19 0749    Lab Results:  Results for orders placed or performed during the hospital encounter of 03/23/19 (from the past 48 hour(s))  Glucose, capillary     Status: Abnormal   Collection Time: 03/27/19  4:29 PM  Result Value Ref Range   Glucose-Capillary 191 (H) 70 - 99 mg/dL  Glucose, capillary     Status: Abnormal   Collection Time: 03/27/19   7:59 PM  Result Value Ref Range   Glucose-Capillary 36 (LL) 70 - 99 mg/dL  Glucose, capillary     Status: Abnormal   Collection Time: 03/27/19  8:59  PM  Result Value Ref Range   Glucose-Capillary 167 (H) 70 - 99 mg/dL  Glucose, capillary     Status: Abnormal   Collection Time: 03/28/19  6:57 AM  Result Value Ref Range   Glucose-Capillary 371 (H) 70 - 99 mg/dL   Comment 1 Notify RN   Glucose, capillary     Status: Abnormal   Collection Time: 03/28/19 11:19 AM  Result Value Ref Range   Glucose-Capillary 106 (H) 70 - 99 mg/dL  Glucose, capillary     Status: Abnormal   Collection Time: 03/28/19  4:02 PM  Result Value Ref Range   Glucose-Capillary 303 (H) 70 - 99 mg/dL  Glucose, capillary     Status: Abnormal   Collection Time: 03/28/19  7:57 PM  Result Value Ref Range   Glucose-Capillary 328 (H) 70 - 99 mg/dL  Glucose, capillary     Status: Abnormal   Collection Time: 03/29/19  7:27 AM  Result Value Ref Range   Glucose-Capillary 233 (H) 70 - 99 mg/dL  Glucose, capillary     Status: Abnormal   Collection Time: 03/29/19 10:29 AM  Result Value Ref Range   Glucose-Capillary 270 (H) 70 - 99 mg/dL  Glucose, capillary     Status: Abnormal   Collection Time: 03/29/19  4:20 PM  Result Value Ref Range   Glucose-Capillary 428 (H) 70 - 99 mg/dL   Comment 1 Notify RN     Blood Alcohol level:  No results found for: [facility]  Metabolic Disorder Labs: Lab Results  Component Value Date   HGBA1C 10.0 (H) 03/18/2019   MPG 240 03/18/2019   No results found for: PROLACTIN Lab Results  Component Value Date   CHOL 189 01/25/2019   TRIG 82 01/25/2019   HDL 71 01/25/2019   CHOLHDL 2.7 01/25/2019   VLDL 11 05/15/2013   LDLCALC 102 (H) 01/25/2019   LDLCALC 80 05/15/2013    Physical Findings: AIMS:  , ,  ,  ,    CIWA:    COWS:     Musculoskeletal: Strength & Muscle Tone: within normal limits Gait & Station: rolling walker Patient leans: N/A  Psychiatric Specialty Exam: Physical Exam   Nursing note and vitals reviewed. Constitutional: He is oriented to person, place, and time. He appears well-developed and well-nourished.  Cardiovascular: Normal rate.  Respiratory: Effort normal.  Musculoskeletal: Normal range of motion.  Neurological: He is alert and oriented to person, place, and time.  Skin: Skin is warm.  Psychiatric: Judgment and thought content normal.    Review of Systems  Constitutional: Negative.   HENT: Negative.   Eyes: Negative.   Respiratory: Negative.   Cardiovascular: Negative.   Gastrointestinal: Negative.   Genitourinary: Negative.   Musculoskeletal: Negative.   Skin: Negative.   Neurological: Negative.   Endo/Heme/Allergies: Negative.   Psychiatric/Behavioral: Negative for hallucinations and suicidal ideas. The patient has insomnia.     Blood pressure (!) 132/97, pulse 88, temperature 98.7 F (37.1 C), temperature source Oral, resp. rate 18, height 6\' 2"  (1.88 m), weight 74.8 kg, SpO2 100 %.Body mass index is 21.17 kg/m.  General Appearance: Casual  Eye Contact:  Good  Speech:  Normal Rate  Volume:  Normal  Mood:  Irritable  Affect:  Appropriate  Thought Process:  Goal Directed  Orientation:  Full (Time, Place, and Person)  Thought Content:  Logical  Suicidal Thoughts:  No  Homicidal Thoughts:  No  Memory:  Immediate;   Good  Judgement:  Intact  Insight:  Good  Psychomotor Activity:  Normal  Concentration:  Concentration: Good  Recall:  Good  Fund of Knowledge:  Good  Language:  Good  Akathisia:  No  Handed:  Right  AIMS (if indicated):     Assets:  Communication Skills Desire for Improvement Transportation  ADL's:  Intact  Cognition:  WNL  Sleep:  Number of Hours: 0.5     Treatment Plan Summary: Daily contact with patient to assess and evaluate symptoms and progress in treatment and Medication management   Assessment: The patient presents in the day area calm and interacting with peers.  Although smiling with peers he  becomes tearful when speaking about his discharge plan and his son.  He reports poor sleep the night before due to loose stools so simethicone is added to patients medication.  Patient was noted to be sleeping well during the am with the 1:1 in place. Nursing reports that patient is irritable, but redirectable at this time.  Vital signs continued to be monitored and requested nursing staff recheck them.  Patients sugars levels continue to be porly maintained.   Patient to continue on  Gabapentin 600 mg TID-  Neuropathy Hydroxazine 25 mg po prn - anxiety 1:1 in place for safety  Maryfrances Bunnell, FNP 03/29/2019, 4:23 PM

## 2019-03-29 NOTE — Progress Notes (Signed)
Patient remains on 1:1 for safety with a sitter present. Patient med compliant this evening and is without complaint at this time. Patient will be reassessed at 0200 per hyperglycemic protocol. Patient remains safe on the unit.

## 2019-03-29 NOTE — Plan of Care (Signed)
Patient is stable and no falls , 1&1 fall safety protocol in place , patient denies SI/HI/AVH, medication compliant , voice no concerns no distress.   Problem: Education: Goal: Knowledge of [place] [facility] Education information/materials will improve Outcome: Progressing Goal: Emotional status will improve Outcome: Progressing Goal: Mental status will improve Outcome: Progressing Goal: Verbalization of understanding the information provided will improve Outcome: Progressing   Problem: Activity: Goal: Interest or engagement in activities will improve Outcome: Progressing Goal: Sleeping patterns will improve Outcome: Progressing   Problem: Coping: Goal: Ability to verbalize frustrations and anger appropriately will improve Outcome: Progressing Goal: Ability to demonstrate self-control will improve Outcome: Progressing   Problem: Health Behavior/Discharge Planning: Goal: Identification of resources available to assist in meeting health care needs will improve Outcome: Progressing Goal: Compliance with treatment plan for underlying cause of condition will improve Outcome: Progressing   Problem: Physical Regulation: Goal: Ability to maintain clinical measurements within normal limits will improve Outcome: Progressing   Problem: Safety: Goal: Periods of time without injury will increase Outcome: Progressing

## 2019-03-29 NOTE — Progress Notes (Signed)
745 in dayroom eating breakfast  Appetite fair  1:1 present  845 Patient in room  Awake in bed 1:1  Present   0945  In dayroom  Sitting 1:1 present  1000 In group in craft room 1:1 present  1100 In room  Resting 1:1 present   1200  Walk to bathroom  Continue to use  Walker  Appetite good at meals   1:1 present  1300 Patient  In Dayroom  Sitting  At table 1:1 present  Patient knowledgeable of information received regarding unit programing able to verbalize understanding . Patient attending unit therapy groups . Mental and emotional status improving. Voice no concerns around sleep. No anger outburst noted able to control behavior . Continue to walk with a walker   1400 Patient in  With visit of MD  1500 Patient in room sitting on side of bed 1:1 present  1600 Dayroom sitting at table  With peers 1:1 present  1625 blood glucose 428 Instructed  MD on Value . Informed MD of Lab draw  Informed to give the 9 units and 3 units   1700 Patient sitting in dayroom  With peers  At table 1:1 present  1800 Patient sitting in dayroom 1:1 present  1900 Patient sitting in dayroom 1:1 present

## 2019-03-29 NOTE — Plan of Care (Signed)
Patient observed interacting appropriately with staff and peers this evening. Patient said he had a better day today than he did yesterday. Patient presents with a better demeanor than when this writer observed him in the past.    Problem: Education: Goal: Emotional status will improve Outcome: Progressing Goal: Mental status will improve Outcome: Progressing

## 2019-03-29 NOTE — BHH Group Notes (Signed)
LCSW Group Therapy Note  03/29/2019 1:00 PM  Type of Therapy/Topic:  Group Therapy:  Emotion Regulation  Participation Level:  Did Not Attend   Description of Group:   The purpose of this group is to assist patients in learning to regulate negative emotions and experience positive emotions. Patients will be guided to discuss ways in which they have been vulnerable to their negative emotions. These vulnerabilities will be juxtaposed with experiences of positive emotions or situations, and patients will be challenged to use positive emotions to combat negative ones. Special emphasis will be placed on coping with negative emotions in conflict situations, and patients will process healthy conflict resolution skills.  Therapeutic Goals: 1. Patient will identify two positive emotions or experiences to reflect on in order to balance out negative emotions 2. Patient will label two or more emotions that they find the most difficult to experience 3. Patient will demonstrate positive conflict resolution skills through discussion and/or role plays  Summary of Patient Progress: X  Therapeutic Modalities:   Cognitive Behavioral Therapy Feelings Identification Dialectical Behavioral Therapy  Jesenia Spera, MSW, LCSW 03/29/2019 10:32 AM 

## 2019-03-29 NOTE — Progress Notes (Signed)
Patient has been appropriate and has demonstrated safety awareness  Through teaching and therapeutic support , mood is bright and compliant with his medications , coping normally with encouragement ,  Patient is at fall risk precautions , requiring him using a walker and safety 1&1 precautions is provided, patient denies any thought of suicidal or homicidal ideations but has expressed anxiety and depressions  5/10 scale , patient is still awaiting placement , nurse encourage group participation and socialization with peers to improve emotional state , education is provided for safety , blood glucose levels are managed with sliding scales and is appropriate, no distress , 1&1 safety protocol is continued.

## 2019-03-29 NOTE — Progress Notes (Signed)
Patient's BS was High and registered over 600. MD called and 8 units of insulin aspart (novoLOG) were given as well as 35 units of insulin glargine (LANTUS) per MD instructions. Patients blood sugar will be reassessed at 0200.

## 2019-03-29 NOTE — Plan of Care (Signed)
Patient knowledgeable of information   received  regarding unit programing  able  to verbalize understanding . Patient attending unit  therapy groups . Mental and emotional status improving. Voice no concerns around sleep. No anger outburst noted  able to control behavior . Continue to walk with a walker      Problem: Education: Goal: Knowledge of [place] [facility] Education information/materials will improve Outcome: Progressing Goal: Emotional status will improve Outcome: Progressing Goal: Mental status will improve Outcome: Progressing Goal: Verbalization of understanding the information provided will improve Outcome: Progressing   Problem: Coping: Goal: Ability to verbalize frustrations and anger appropriately will improve Outcome: Progressing Goal: Ability to demonstrate self-control will improve Outcome: Progressing   Problem: Health Behavior/Discharge Planning: Goal: Identification of resources available to assist in meeting health care needs will improve Outcome: Progressing Goal: Compliance with treatment plan for underlying cause of condition will improve Outcome: Progressing   Problem: Physical Regulation: Goal: Ability to maintain clinical measurements within normal limits will improve Outcome: Progressing   Problem: Safety: Goal: Periods of time without injury will increase Outcome: Progressing

## 2019-03-30 LAB — GLUCOSE, CAPILLARY
Glucose-Capillary: 321 mg/dL — ABNORMAL HIGH (ref 70–99)
Glucose-Capillary: 102 mg/dL — ABNORMAL HIGH (ref 70–99)
Glucose-Capillary: 201 mg/dL — ABNORMAL HIGH (ref 70–99)
Glucose-Capillary: 213 mg/dL — ABNORMAL HIGH (ref 70–99)
Glucose-Capillary: 179 mg/dL — ABNORMAL HIGH (ref 70–99)

## 2019-03-30 NOTE — Plan of Care (Signed)
Patient was in a good mood this evening and was observed interacting appropriately with staff and peers on the unit.   Problem: Education: Goal: Emotional status will improve Outcome: Progressing Goal: Mental status will improve Outcome: Progressing

## 2019-03-30 NOTE — Progress Notes (Signed)
Physical Therapy Treatment Patient Details Name: Cody Campbell. Kimmey MRN: [id number] DOB: 08-Oct-1965 Today's Date: 03/30/2019    History of Present Illness Pt is a 53 y/o M admitted for adjustment disorder with mixed anxiety and depressed mood. C/o dizziness, lethargy, and altered sensation 2/2 diabetic neuropathy. PMH includes DMII, HTN, HLD, depression.    PT Comments    Pt is a pleasant 53 year old M who was admitted for reasons above. Upon entry, pt is walking out of group excited to work with PT to build up his strength. Pt performs transfers, and ambulation with supervision and use of RW. MMT reveals generalized weakness in UE/LE and pt fatigues easily with therex. Pt demonstrates deficits with strength, activity tolerance, and pain management. Pt will benefit from skilled PT to address above deficits; current follow-up care recommendation is OP PT.     Follow Up Recommendations  Outpatient PT     Equipment Recommendations  Rolling walker with 5" wheels    Recommendations for Other Services       Precautions / Restrictions Precautions Precautions: None Restrictions Weight Bearing Restrictions: No    Mobility  Bed Mobility               General bed mobility comments: Pt walking at start of appt and seated EOB at end of session  Transfers Overall transfer level: Needs assistance Equipment used: Rolling walker (2 wheeled) Transfers: Sit to/from Stand Sit to Stand: Supervision         General transfer comment: Pt able to perform transfers using RW. Able to perform without delays and without apparent LOB's.   Ambulation/Gait Ambulation/Gait assistance: Supervision Gait Distance (Feet): 100 Feet Assistive device: Rolling walker (2 wheeled) Gait Pattern/deviations: Step-through pattern;Trunk flexed     General Gait Details: Pt performs amb using RW. Displays reciprocal gait pattern, but with trunk flexed. Pt reports to be using the RW for stability. Height of RW  adjusted, minor improvement in posture following adjustment.    Stairs             Wheelchair Mobility    Modified Rankin (Stroke Patients Only)       Balance Overall balance assessment: Needs assistance Sitting-balance support: Feet supported Sitting balance-Leahy Scale: Normal     Standing balance support: No upper extremity supported;During functional activity Standing balance-Leahy Scale: Good Standing balance comment: Pt is able to perform amb without the use of AD, but fatigues quickly and at times reaches for supporf                            Cognition Arousal/Alertness: Awake/alert Behavior During Therapy: [facility] for tasks assessed/performed Overall Cognitive Status: Within Functional Limits for tasks assessed                                 General Comments: Pt reports feeling better today.       Exercises Other Exercises Other Exercises: Seated EOB, 5 reps, bilateral: LAQ; PT supervision Other Exercises: Standing, 7 reps, bilateral: standing hip abduction holding onto chair for single UE support Other Exercises: Pt able to ambulate without using AD ~15 feet. Demonstrates increase in trunk flexion without AD and reaching for walker for support, but able to complete. PT supervision provided    General Comments        Pertinent Vitals/Pain Pain Assessment: Faces Faces Pain Scale: Hurts a little bit Pain  Location: BLE Pain Descriptors / Indicators: Numbness;Burning Pain Intervention(s): Monitored during session;Limited activity within patient's tolerance;Repositioned    Home Living Family/patient expects to be discharged to:: Shelter/Homeless Living Arrangements: Alone                  Prior Function Level of Independence: Independent      Comments: Pt functioning prior to admission without use of AD; pt has been experiencing numbness/pain 2/2 diabetic neuropathy for extended time. Pt had been working a physical job  prior to admission, however is now unemployed.   PT Goals (current goals can now be found in the care plan section) Acute Rehab PT Goals Patient Stated Goal: to get stronger PT Goal Formulation: With patient Time For Goal Achievement: 04/13/19 Potential to Achieve Goals: Good    Frequency    Min 2X/week      PT Plan      Co-evaluation              AM-PAC PT "6 Clicks" Mobility   Outcome Measure  Help needed turning from your back to your side while in a flat bed without using bedrails?: None Help needed moving from lying on your back to sitting on the side of a flat bed without using bedrails?: None Help needed moving to and from a bed to a chair (including a wheelchair)?: A Little Help needed standing up from a chair using your arms (e.g., wheelchair or bedside chair)?: A Little Help needed to walk in hospital room?: A Little Help needed climbing 3-5 steps with a railing? : A Little 6 Click Score: 20    End of Session Equipment Utilized During Treatment: Gait belt Activity Tolerance: Patient limited by fatigue Patient left: Other (comment)(Walking in unit using RW) Nurse Communication: Mobility status PT Visit Diagnosis: Unsteadiness on feet (R26.81);Muscle weakness (generalized) (M62.81);Pain Pain - Right/Left: (BLE) Pain - part of body: Ankle and joints of foot     Time: 1335-1403 PT Time Calculation (min) (ACUTE ONLY): 28 min  Charges:  $Therapeutic Exercise: 8-22 mins                     Curt Oatis, SPT    Arrie Zuercher 03/30/2019, 5:00 PM

## 2019-03-30 NOTE — BHH Counselor (Signed)
CSW called Amy Shepherd at [employer] [facility] to request that the application be submitted.   Amy provided the name and contact information of the staff that may be able to assist with this Aleene Davidson at [phone].  She reports that she will email Suanne Marker. She reports that it is possible that follow up will not happen until Monday due to the staff being out of the office.  CSW will follow up.  [name], MSW, LCSW 03/30/2019 11:45 AM

## 2019-03-30 NOTE — Plan of Care (Signed)
Patient knowledgeable of information   received  regarding unit programing  able  to verbalize understanding . Patient attending unit  therapy groups . Mental and emotional status improving. Voice no concerns around sleep. No anger outburst noted  able to control behavior . Continue to walk with a walker  Patient wearing Ted hose      Problem: Education: Goal: Knowledge of [facility] Education information/materials will improve Outcome: Progressing Goal: Emotional status will improve Outcome: Progressing Goal: Mental status will improve Outcome: Progressing Goal: Verbalization of understanding the information provided will improve Outcome: Progressing   Problem: Activity: Goal: Interest or engagement in activities will improve Outcome: Progressing Goal: Sleeping patterns will improve Outcome: Progressing   Problem: Coping: Goal: Ability to verbalize frustrations and anger appropriately will improve Outcome: Progressing Goal: Ability to demonstrate self-control will improve Outcome: Progressing   Problem: Health Behavior/Discharge Planning: Goal: Identification of resources available to assist in meeting health care needs will improve Outcome: Progressing Goal: Compliance with treatment plan for underlying cause of condition will improve Outcome: Progressing   Problem: Physical Regulation: Goal: Ability to maintain clinical measurements within normal limits will improve Outcome: Progressing   Problem: Safety: Goal: Periods of time without injury will increase Outcome: Progressing

## 2019-03-30 NOTE — Progress Notes (Addendum)
Inpatient Diabetes Program Recommendations  AACE/ADA: New Consensus Statement on Inpatient Glycemic Control (2015)  Target Ranges:  Prepandial:   less than 140 mg/dL      Peak postprandial:   less than 180 mg/dL (1-2 hours)      Critically ill patients:  140 - 180 mg/dL    Results for [name], [name] (MRN [id number]) as of 03/30/2019 12:22  Ref. Range 03/29/2019 07:27 03/29/2019 10:29 03/29/2019 16:20 03/29/2019 21:53 03/30/2019 02:14 03/30/2019 07:03 03/30/2019 11:39  Glucose-Capillary Latest Ref Range: 70 - 99 mg/dL 233 (H) 270 (H) 428 (H) >600 (HH) 321 (H) 102 (H) 201 (H)    History: DM, Depression, Homelessness   Home DM Meds: Lantus 35 units Daily                              Novolog 6 units TID   Current Orders: Lantus 35 units QHS                            Novolog Sensitive Correction Scale/ SSI (0-9 units) TID AC      Novolog 3 units TID with meals    PCP: [facility] (gets Insulin at [place])     MD- Glucose increases significantly after meal still after Novolog 3 units meal coverage.  Please consider the following:   -    Increase Novolog Meal Coverage to: Novolog 5 units TID with meals  (Please add the following Hold Parameters: Hold if pt eats <50% of meal, Hold if pt NPO)   --Will follow patient during hospitalization--  Tama Headings RN, MSN, BC-ADM Inpatient Diabetes Coordinator Team Pager [phone] (8a-5p)

## 2019-03-30 NOTE — Progress Notes (Signed)
[facility] MD Progress Note  03/30/2019 11:30 AM Cody Campbell D. Conradt  MRN:  [id number]   Subjective:  Patient is seen in person for follow-up for this patient with anxiety and depression.    Patient reports today that he is doing good.  He states that he slept better last night and that he has not had any digestive issues.  Patient then immediately goes into discussion about his diet and that he is not happy with the way staff handle his diet.  He states that if he wants to have a cheeseburger or something else then just give him more insulin.  Attempted to educate the patient and he interrupted me and inform me that he understands how diabetes works and that all we need to do is give him more insulin so that he can have the food that he wants.  Patient is informed about his diet change back to a carb modified to help improve his blood sugars.  Patient then goes into detail about not knowing where he is going to stay and that everyone is turned their backs on him even though he is very empathetic and that he has always been there to help other people.  Patient denies any suicidal ideations and denies any hallucinations.  When patient is asked if he has homicidal ideations he laughs and then says I do not want to kill anybody at this would like to have the people that fired him go through the same things that he have been through and "walk a mile in my shoes."  Principal Problem: Adjustment disorder with mixed anxiety and depressed mood Diagnosis: Principal Problem:   Adjustment disorder with mixed anxiety and depressed mood Active Problems:   DM (diabetes mellitus), type 2, uncontrolled (HCC)   Hypertension   Diabetic neuropathy (HCC)  Total Time spent with patient: 30 minutes  Past Psychiatric History: Patient claims to have no past psychiatric history.  Says he is never seen a therapist psychiatrist or mental health provider.  I mention to him that a note in the chart says that he had been prescribed an  antidepressant medicine at one time.  Patient acknowledges that but says he did not take it for long did not follow-up and cannot remember what the name of it was.  He denies ever having tried to hurt himself in the past.  Denies any history of mania.  Denies any history anytime as an adult of abusing alcohol or drugs:      Past Medical History:  Past Medical History:  Diagnosis Date  . Diabetes mellitus without complication (HCC)   . Elevated LFTs 05/21/2011  . Hyperlipidemia   . Hypertension    History reviewed. No pertinent surgical history. Family History:  Family History  Problem Relation Age of Onset  . Diabetes Mother   . Hypertension Father   . Diabetes Father   . Heart disease Father    Family Psychiatric  History: No significant family history Social History:  Social History   Substance and Sexual Activity  Alcohol Use Yes  . Alcohol/week: 1.0 standard drinks  . Types: 1 Shots of liquor per week   Comment: occasionally, 1 beer a day     Social History   Substance and Sexual Activity  Drug Use Yes  . Types: Marijuana    Social History   Socioeconomic History  . Marital status: Married    Spouse name: Not on file  . Number of children: Not on file  . Years of  education: Not on file  . Highest education level: Not on file  Occupational History  . Not on file  Social Needs  . Financial resource strain: Not on file  . Food insecurity    Worry: Not on file    Inability: Not on file  . Transportation needs    Medical: Not on file    Non-medical: Not on file  Tobacco Use  . Smoking status: Current Every Day Smoker    Packs/day: 2.00    Years: 35.00    Pack years: 70.00    Types: Cigars  . Smokeless tobacco: Current User  . Tobacco comment: Smoking 1-2 small cigars daily  Substance and Sexual Activity  . Alcohol use: Yes    Alcohol/week: 1.0 standard drinks    Types: 1 Shots of liquor per week    Comment: occasionally, 1 beer a day  . Drug use: Yes     Types: Marijuana  . Sexual activity: Yes    Partners: Female    Birth control/protection: Condom  Lifestyle  . Physical activity    Days per week: Not on file    Minutes per session: Not on file  . Stress: Not on file  Relationships  . Social Herbalist on phone: Not on file    Gets together: Not on file    Attends religious service: Not on file    Active member of club or organization: Not on file    Attends meetings of clubs or organizations: Not on file    Relationship status: Not on file  Other Topics Concern  . Not on file  Social History Narrative  . Not on file   Additional Social History:                         Sleep: Poor  Appetite:  Good  Current Medications: Current Facility-Administered Medications  Medication Dose Route Frequency Provider Last Rate Last Dose  . fluticasone (FLONASE) 50 MCG/ACT nasal spray 2 spray  2 spray Each Nare Daily Clapacs, Madie Reno, MD   2 spray at 03/28/19 0800  . gabapentin (NEURONTIN) tablet 600 mg  600 mg Oral TID Clapacs, John T, MD   600 mg at 03/30/19 0736  . hydrOXYzine (ATARAX/VISTARIL) tablet 25 mg  25 mg Oral Q6H PRN Caroline Sauger, NP      . insulin aspart (novoLOG) injection 0-9 Units  0-9 Units Subcutaneous TID WC Clapacs, Madie Reno, MD   9 Units at 03/29/19 1626  . insulin aspart (novoLOG) injection 3 Units  3 Units Subcutaneous TID WC Clapacs, Madie Reno, MD   3 Units at 03/30/19 [phone]  . insulin glargine (LANTUS) injection 35 Units  35 Units Subcutaneous Daily Clapacs, Madie Reno, MD   35 Units at 03/29/19 2211  . loperamide (IMODIUM) capsule 4 mg  4 mg Oral PRN Clapacs, Madie Reno, MD   4 mg at 03/28/19 1708  . magnesium hydroxide (MILK OF MAGNESIA) suspension 30 mL  30 mL Oral Daily PRN Caroline Sauger, NP      . ondansetron (ZOFRAN) tablet 4 mg  4 mg Oral Q6H PRN Clapacs, John T, MD      . oxymetazoline (AFRIN) 0.05 % nasal spray 1 spray  1 spray Each Nare BID Clapacs, Madie Reno, MD   1 spray at 03/30/19  [phone]  . pantoprazole (PROTONIX) EC tablet 40 mg  40 mg Oral Daily Caili Escalera, Lowry Ram, FNP   40 mg  at 03/30/19 0736  . simethicone (MYLICON) chewable tablet 80 mg  80 mg Oral QID PRN Modestine Scherzinger, Gerlene Burdockravis B, FNP        Lab Results:  Results for orders placed or performed during the hospital encounter of 03/23/19 (from the past 48 hour(s))  Glucose, capillary     Status: Abnormal   Collection Time: 03/28/19  4:02 PM  Result Value Ref Range   Glucose-Capillary 303 (H) 70 - 99 mg/dL  Glucose, capillary     Status: Abnormal   Collection Time: 03/28/19  7:57 PM  Result Value Ref Range   Glucose-Capillary 328 (H) 70 - 99 mg/dL  Glucose, capillary     Status: Abnormal   Collection Time: 03/29/19  7:27 AM  Result Value Ref Range   Glucose-Capillary 233 (H) 70 - 99 mg/dL  Glucose, capillary     Status: Abnormal   Collection Time: 03/29/19 10:29 AM  Result Value Ref Range   Glucose-Capillary 270 (H) 70 - 99 mg/dL  Glucose, capillary     Status: Abnormal   Collection Time: 03/29/19  4:20 PM  Result Value Ref Range   Glucose-Capillary 428 (H) 70 - 99 mg/dL   Comment 1 Notify RN   Glucose, capillary     Status: Abnormal   Collection Time: 03/29/19  9:53 PM  Result Value Ref Range   Glucose-Capillary >600 (HH) 70 - 99 mg/dL  Glucose, capillary     Status: Abnormal   Collection Time: 03/30/19  2:14 AM  Result Value Ref Range   Glucose-Capillary 321 (H) 70 - 99 mg/dL  Glucose, capillary     Status: Abnormal   Collection Time: 03/30/19  7:03 AM  Result Value Ref Range   Glucose-Capillary 102 (H) 70 - 99 mg/dL    Blood Alcohol level:  No results found for: [facility]  Metabolic Disorder Labs: Lab Results  Component Value Date   HGBA1C 10.0 (H) 03/18/2019   MPG 240 03/18/2019   No results found for: PROLACTIN Lab Results  Component Value Date   CHOL 189 01/25/2019   TRIG 82 01/25/2019   HDL 71 01/25/2019   CHOLHDL 2.7 01/25/2019   VLDL 11 05/15/2013   LDLCALC 102 (H) 01/25/2019   LDLCALC 80  05/15/2013    Physical Findings: AIMS:  , ,  ,  ,    CIWA:    COWS:     Musculoskeletal: Strength & Muscle Tone: within normal limits Gait & Station: rolling walker improvement shown with strength and ambulation Patient leans: N/A  Psychiatric Specialty Exam: Physical Exam  Nursing note and vitals reviewed. Constitutional: He is oriented to person, place, and time. He appears well-developed and well-nourished.  Cardiovascular: Normal rate.  Respiratory: Effort normal.  Musculoskeletal: Normal range of motion.  Neurological: He is alert and oriented to person, place, and time.  Skin: Skin is warm.  Psychiatric: Judgment and thought content normal.    Review of Systems  Constitutional: Negative.   HENT: Negative.   Eyes: Negative.   Respiratory: Negative.   Cardiovascular: Negative.   Gastrointestinal: Negative.   Genitourinary: Negative.   Musculoskeletal: Negative.   Skin: Negative.   Neurological: Negative.   Endo/Heme/Allergies: Negative.   Psychiatric/Behavioral: Negative for hallucinations and suicidal ideas.    Blood pressure 128/77, pulse 76, temperature 97.7 F (36.5 C), temperature source Oral, resp. rate 17, height 6\' 2"  (1.88 m), weight 74.8 kg, SpO2 100 %.Body mass index is 21.17 kg/m.  General Appearance: Casual  Eye Contact:  Good  Speech:  Normal Rate  Volume:  Normal  Mood:  Irritable  Affect:  Appropriate  Thought Process:  Goal Directed  Orientation:  Full (Time, Place, and Person)  Thought Content:  Logical  Suicidal Thoughts:  No  Homicidal Thoughts:  No  Memory:  Immediate;   Good  Judgement:  Intact  Insight:  Good  Psychomotor Activity:  Normal  Concentration:  Concentration: Good  Recall:  Good  Fund of Knowledge:  Good  Language:  Good  Akathisia:  No  Handed:  Right  AIMS (if indicated):     Assets:  Communication Skills Desire for Improvement Transportation  ADL's:  Intact  Cognition:  WNL  Sleep:  Number of Hours: 7    Assessment: Patient presents in his room lying in the bed talking to a nursing student.  Patient is extremely talkative.  However his entire conversation is based off of him complaining about how things were not going his way and how much she is done good for other people yet nobody returns the favor.  He talks about working in [employer] field and states that he has understanding of the things that go on and how he needs to have things work for him.  I did speak with the nursing staff and requested them to get his cell phone so he can receive some numbers.  I was informed by CSW that patient was going to be interviewed by someone to possibly provide housing for him with his medical concerns.  He is continued to deny any suicidal homicidal ideations and denies any hallucinations.  Patient does present with some significant borderline personality tendencies and has continuous blame on others around him and not taking responsibility for any of the circumstances that he has ended up in.  Patient's blood glucose level elevated last night to greater than 600 at 2153 patient was given insulin to reduce his glucose level and at 0703 this morning it was 102.  Patient did have a 1: 1 sitter due to his safety for fall risk.  However patient has shown significant improvement with walking and is more upright and is actually walked away from his walker a few times.  Patient does show more stability with his ambulation.  We will discontinue 1: 1 sitter and allow patient to be on every 15 minute safety checks.   Treatment Plan Summary: Daily contact with patient to assess and evaluate symptoms and progress in treatment and Medication management  Continue Neurontin 600 mg p.o. 3 times daily for neuropathy Continue Vistaril 25 mg p.o. every 6 hours as needed for anxiety Continue sliding-scale for his diabetes Continue Lantus 35 units subcu daily Continue Protonix 40 mg p.o. daily for GERD Continue simethicone 80 mg 4  times daily as needed for flatulence Encourage group therapy participation Restart every 15 minute safety checks Discontinue 1:1 Change diet to carb modified due to diabetes Maryfrances Bunnell, FNP 03/30/2019, 11:30 AM

## 2019-03-30 NOTE — Progress Notes (Signed)
Recreation Therapy Notes  Date: 03/30/2019  Time: 9:30 am  Location: Craft room  Behavioral response: Appropriate   Intervention Topic: Leisure  Discussion/Intervention:  Group content today was focused on leisure. The group defined what leisure is and some positive leisure activities they participate in. Individuals identified the difference between good and bad leisure. Participants expressed how they feel after participating in the leisure of their choice. The group discussed how they go about picking a leisure activity and if others are involved in their leisure activities. The patient stated how many leisure activities they too choose from and reasons why it is important to have leisure time. Individuals participated in the intervention "Exploration of Leisure" where they had a chance to identify new leisure activities as well as benefits of leisure. Clinical Observations/Feedback:  Patient came to group late due to unknown reasons. Individual was social with peers and staff while participating in the intervention.    Montey Ebel LRT/CTRS         Ernan Runkles 03/30/2019 11:53 AM

## 2019-03-30 NOTE — B[facility] Counselor (Signed)
CSW assisted the patient in completing an interview with Jacques Navy at [phone].  Brooks and patient completed the interview.  Brooks reviewed demographic information, work history, medical history and mental health concerns with the patient.     Patient reports that he has been homeless since 03/18/2019.  He reports that he last worked 1-2 1/2 months ago.    Brooks requested that CSW do the following: -request a PT evaluation due to patients reports of neuropathy in hands and feet and difficulty gripping -contact financial counseling to have them submit the application that was started so that it could still be retroactive and support the disability application that she was going to submit  Rolena Infante requested that CSW call back once these things have been accomplished.    CSW has requested that doctor request PT consult.  [name], MSW, LCSW 03/30/2019 11:21 AM

## 2019-03-30 NOTE — Progress Notes (Signed)
D - Patient was in the day room upon arrival. Patient was pleasant during assessment denying SI/HI/AVH, pain, anxiety and depression. Patients blood sugar was 179 this evening, 2 units given per protocol. Patient was educated today on a carb modified diet and ate carb modified snack this evening.   A - Patient compliant with medication administration per MD orders. Patient's Lantus 35 Units were held by this writer this evening. Patient given education. Patient given support and encouragement to be active in his treatment plan. Patient informed to let staff know if there are any issues or problems on the unit.   R - Patient being monitored Q 15 minutes for safety per unit protocol. Patient remains safe on the unit.

## 2019-03-30 NOTE — Progress Notes (Signed)
D: Patient  Noted argumentative this am shift with concerns around his meals and staff Noted to walk with walker  Gait slow and steady  Patient wearing his ted hose affect cheerful on approach  Patient attending unit programing . Appetite good and  Voice no concerns around  Sleep. Patient stated slept good last night .Stated appetitegood and energy level  Low . Stated concentration is poor. Stated on Depression scale 5 , hopeless 5 and anxiety 5 .( low 0-10 high) Denies suicidal  homicidal ideations  .No auditory hallucinations  No pain concerns . Appropriate ADL'S. Interacting with peers and staff.  Patient worked with physical therapy. Walker adjusted. Patient allowed  To get numbers out of phone  To follow up with placement A: Encourage patient participation with unit programming . Instruction  Given on  Medication , verbalize understanding. R: Voice no other concerns. Staff continue to monitor

## 2019-03-30 NOTE — [facility] Group Notes (Signed)
LCSW Group Therapy Note  03/30/2019 1:53 PM  Type of Therapy/Topic:  Group Therapy:  Balance in Life  Participation Level:  Active  Description of Group:    This group will address the concept of balance and how it feels and looks when one is unbalanced. Patients will be encouraged to process areas in their lives that are out of balance and identify reasons for remaining unbalanced. Facilitators will guide patients in utilizing problem-solving interventions to address and correct the stressor making their life unbalanced. Understanding and applying boundaries will be explored and addressed for obtaining and maintaining a balanced life. Patients will be encouraged to explore ways to assertively make their unbalanced needs known to significant others in their lives, using other group members and facilitator for support and feedback.  Therapeutic Goals: 1. Patient will identify two or more emotions or situations they have that consume much of in their lives. 2. Patient will identify signs/triggers that life has become out of balance:  3. Patient will identify two ways to set boundaries in order to achieve balance in their lives:  4. Patient will demonstrate ability to communicate their needs through discussion and/or role plays  Summary of Patient Progress: Pt was appropriate and respectful in group. Pt was able to identify areas in his life that are not balanced. Pt reported that it all goes back to physical health, for instance, he has the knowledge and skill to work but cant due to his health, which in turns made him lose his housing and declined other areas in his life. Pt discussed having to reboot sometimes and start over to get things back balanced how they were.     Therapeutic Modalities:   Cognitive Behavioral Therapy Solution-Focused Therapy Assertiveness Training  Evalina Field, MSW, LCSW Clinical Social Work 03/30/2019 1:53 PM

## 2019-03-31 LAB — GLUCOSE, CAPILLARY
Glucose-Capillary: 209 mg/dL — ABNORMAL HIGH (ref 70–99)
Glucose-Capillary: 153 mg/dL — ABNORMAL HIGH (ref 70–99)
Glucose-Capillary: 298 mg/dL — ABNORMAL HIGH (ref 70–99)
Glucose-Capillary: 168 mg/dL — ABNORMAL HIGH (ref 70–99)
Glucose-Capillary: 98 mg/dL (ref 70–99)
Glucose-Capillary: 146 mg/dL — ABNORMAL HIGH (ref 70–99)

## 2019-03-31 NOTE — [facility] Counselor (Signed)
CSW received call from Aron Baba who encourages that a Medicaid application be completed to support the patient.  She reports that it unlikely that patient can be successful at shelter and for placement to be considered the Medicaid would be beneficial.    She reports that someone may be coming to complete the Medicaid application.  [name], MSW, LCSW 03/31/2019 3:51 PM

## 2019-03-31 NOTE — [facility] Counselor (Signed)
CSW attempted to call back Jacques Navy at [phone].  CSW left a HIPAA compliant voicemail.  [name], MSW, LCSW 03/31/2019 3:15 PM

## 2019-03-31 NOTE — Progress Notes (Signed)
[facility] MD Progress Note  03/31/2019 6:19 PM Cody Campbell  MRN:  [id number] Subjective: Follow-up for this gentleman and awaiting some sort of placement.  He has no new complaints today.  Denies being depressed.  Denies suicidal thoughts.  Denies psychotic symptoms. Principal Problem: Adjustment disorder with mixed anxiety and depressed mood Diagnosis: Principal Problem:   Adjustment disorder with mixed anxiety and depressed mood Active Problems:   DM (diabetes mellitus), type 2, uncontrolled (HCC)   Hypertension   Diabetic neuropathy ([place])  Total Time spent with patient: 20 minutes  Past Psychiatric History: None  Past Medical History:  Past Medical History:  Diagnosis Date  . Diabetes mellitus without complication ([place])   . Elevated LFTs 05/21/2011  . Hyperlipidemia   . Hypertension    History reviewed. No pertinent surgical history. Family History:  Family History  Problem Relation Age of Onset  . Diabetes Mother   . Hypertension Father   . Diabetes Father   . Heart disease Father    Family Psychiatric  History: None Social History:  Social History   Substance and Sexual Activity  Alcohol Use Yes  . Alcohol/week: 1.0 standard drinks  . Types: 1 Shots of liquor per week   Comment: occasionally, 1 beer a day     Social History   Substance and Sexual Activity  Drug Use Yes  . Types: Marijuana    Social History   Socioeconomic History  . Marital status: Married    Spouse name: Not on file  . Number of children: Not on file  . Years of education: Not on file  . Highest education level: Not on file  Occupational History  . Not on file  Social Needs  . Financial resource strain: Not on file  . Food insecurity    Worry: Not on file    Inability: Not on file  . Transportation needs    Medical: Not on file    Non-medical: Not on file  Tobacco Use  . Smoking status: Current Every Day Smoker    Packs/day: 2.00    Years: 35.00    Pack years: 70.00   Types: Cigars  . Smokeless tobacco: Current User  . Tobacco comment: Smoking 1-2 small cigars daily  Substance and Sexual Activity  . Alcohol use: Yes    Alcohol/week: 1.0 standard drinks    Types: 1 Shots of liquor per week    Comment: occasionally, 1 beer a day  . Drug use: Yes    Types: Marijuana  . Sexual activity: Yes    Partners: Female    Birth control/protection: Condom  Lifestyle  . Physical activity    Days per week: Not on file    Minutes per session: Not on file  . Stress: Not on file  Relationships  . Social Herbalist on phone: Not on file    Gets together: Not on file    Attends religious service: Not on file    Active member of club or organization: Not on file    Attends meetings of clubs or organizations: Not on file    Relationship status: Not on file  Other Topics Concern  . Not on file  Social History Narrative  . Not on file   Additional Social History:                         Sleep: Fair  Appetite:  Fair  Current Medications: Current Facility-Administered Medications  Medication Dose Route Frequency Provider Last Rate Last Dose  . fluticasone (FLONASE) 50 MCG/ACT nasal spray 2 spray  2 spray Each Nare Daily Haizley Cannella, Jackquline DenmarkJohn T, MD   2 spray at 03/28/19 0800  . gabapentin (NEURONTIN) tablet 600 mg  600 mg Oral TID Josselyne Onofrio T, MD   600 mg at 03/31/19 1710  . hydrOXYzine (ATARAX/VISTARIL) tablet 25 mg  25 mg Oral Q6H PRN Gillermo Murdochhompson, Jacqueline, NP      . insulin aspart (novoLOG) injection 0-9 Units  0-9 Units Subcutaneous TID WC Mardy Lucier, Jackquline DenmarkJohn T, MD   5 Units at 03/31/19 1710  . insulin aspart (novoLOG) injection 3 Units  3 Units Subcutaneous TID WC Brittyn Salaz, Jackquline DenmarkJohn T, MD   3 Units at 03/31/19 1710  . insulin glargine (LANTUS) injection 35 Units  35 Units Subcutaneous Daily Vernelle Wisner, Jackquline DenmarkJohn T, MD   35 Units at 03/29/19 2211  . loperamide (IMODIUM) capsule 4 mg  4 mg Oral PRN Tylee Newby, Jackquline DenmarkJohn T, MD   4 mg at 03/28/19 1708  . magnesium  hydroxide (MILK OF MAGNESIA) suspension 30 mL  30 mL Oral Daily PRN Gillermo Murdochhompson, Jacqueline, NP      . ondansetron (ZOFRAN) tablet 4 mg  4 mg Oral Q6H PRN Keanna Tugwell T, MD      . oxymetazoline (AFRIN) 0.05 % nasal spray 1 spray  1 spray Each Nare BID Denzal Meir, Jackquline DenmarkJohn T, MD   1 spray at 03/31/19 1711  . pantoprazole (PROTONIX) EC tablet 40 mg  40 mg Oral Daily Money, Travis B, FNP   40 mg at 03/31/19 0800  . simethicone (MYLICON) chewable tablet 80 mg  80 mg Oral QID PRN Money, Gerlene Burdockravis B, FNP        Lab Results:  Results for orders placed or performed during the hospital encounter of 03/23/19 (from the past 48 hour(s))  Glucose, capillary     Status: Abnormal   Collection Time: 03/29/19  9:53 PM  Result Value Ref Range   Glucose-Capillary >600 (HH) 70 - 99 mg/dL  Glucose, capillary     Status: Abnormal   Collection Time: 03/30/19  2:14 AM  Result Value Ref Range   Glucose-Capillary 321 (H) 70 - 99 mg/dL  Glucose, capillary     Status: Abnormal   Collection Time: 03/30/19  7:03 AM  Result Value Ref Range   Glucose-Capillary 102 (H) 70 - 99 mg/dL  Glucose, capillary     Status: Abnormal   Collection Time: 03/30/19 11:39 AM  Result Value Ref Range   Glucose-Capillary 201 (H) 70 - 99 mg/dL  Glucose, capillary     Status: Abnormal   Collection Time: 03/30/19  4:24 PM  Result Value Ref Range   Glucose-Capillary 213 (H) 70 - 99 mg/dL   Comment 1 Document in Chart   Glucose, capillary     Status: Abnormal   Collection Time: 03/30/19  8:58 PM  Result Value Ref Range   Glucose-Capillary 179 (H) 70 - 99 mg/dL   Comment 1 Notify RN   Glucose, capillary     Status: Abnormal   Collection Time: 03/31/19  7:08 AM  Result Value Ref Range   Glucose-Capillary 209 (H) 70 - 99 mg/dL  Glucose, capillary     Status: Abnormal   Collection Time: 03/31/19 11:35 AM  Result Value Ref Range   Glucose-Capillary 153 (H) 70 - 99 mg/dL  Glucose, capillary     Status: Abnormal   Collection Time: 03/31/19  4:17  PM  Result Value Ref  Range   Glucose-Capillary 298 (H) 70 - 99 mg/dL    Blood Alcohol level:  No results found for: [facility]  Metabolic Disorder Labs: Lab Results  Component Value Date   HGBA1C 10.0 (H) 03/18/2019   MPG 240 03/18/2019   No results found for: PROLACTIN Lab Results  Component Value Date   CHOL 189 01/25/2019   TRIG 82 01/25/2019   HDL 71 01/25/2019   CHOLHDL 2.7 01/25/2019   VLDL 11 05/15/2013   LDLCALC 102 (H) 01/25/2019   LDLCALC 80 05/15/2013    Physical Findings: AIMS:  , ,  ,  ,    CIWA:    COWS:     Musculoskeletal: Strength & Muscle Tone: within normal limits Gait & Station: normal Patient leans: N/A  Psychiatric Specialty Exam: Physical Exam  Nursing note and vitals reviewed. Constitutional: He appears well-developed and well-nourished.  HENT:  Head: Normocephalic and atraumatic.  Eyes: Pupils are equal, round, and reactive to light. Conjunctivae are normal.  Neck: Normal range of motion.  Cardiovascular: Normal heart sounds.  Respiratory: Effort normal.  GI: Soft.  Musculoskeletal: Normal range of motion.  Neurological: He is alert.  Skin: Skin is warm and dry.  Psychiatric: He has a normal mood and affect. His behavior is normal. Judgment and thought content normal.    Review of Systems  Constitutional: Negative.   HENT: Negative.   Eyes: Negative.   Respiratory: Negative.   Cardiovascular: Negative.   Gastrointestinal: Negative.   Musculoskeletal: Negative.   Skin: Negative.   Neurological: Negative.   Psychiatric/Behavioral: Negative.     Blood pressure 115/85, pulse (!) 102, temperature 98.2 F (36.8 C), temperature source Oral, resp. rate 17, height 6\' 2"  (1.88 m), weight 74.8 kg, SpO2 100 %.Body mass index is 21.17 kg/m.  General Appearance: Casual  Eye Contact:  Fair  Speech:  Clear and Coherent  Volume:  Normal  Mood:  Euthymic  Affect:  Congruent  Thought Process:  Goal Directed  Orientation:  Full (Time, Place, and  Person)  Thought Content:  Logical  Suicidal Thoughts:  No  Homicidal Thoughts:  No  Memory:  Immediate;   Fair Recent;   Fair Remote;   Fair  Judgement:  Fair  Insight:  Fair  Psychomotor Activity:  Normal  Concentration:  Concentration: Fair  Recall:  of Knowledge:  Fair  Language:  Fair  Akathisia:  No  Handed:  Right  AIMS (if indicated):     Assets:  Desire for Improvement  ADL's:  Intact  Cognition:  WNL  Sleep:  Number of Hours: 6.25     Treatment Plan Summary: Daily contact with patient to assess and evaluate symptoms and progress in treatment, Medication management and Plan Patient continues to be asymptomatic.  Outgoing but not problematic on the unit.  His blood sugars continue to be out of control much of which is due to his refusal to be fully cooperative with diabetic diet.  No change to medicine for today.  I am told that staff are working on trying to find a way to get him Medicaid.  Possible options will come up next week.  [employer], MD 03/31/2019, 6:19 PM

## 2019-03-31 NOTE — Progress Notes (Signed)
Recreation Therapy Notes    Date: 03/31/2019  Time: 9:30 am   Location: Craft room   Behavioral response: N/A   Intervention Topic: Communication   Discussion/Intervention: Patient did not attend group.   Clinical Observations/Feedback:  Patient did not attend group.   Anabelen Kaminsky LRT/CTRS         Conley Delisle 03/31/2019 10:36 AM

## 2019-03-31 NOTE — Progress Notes (Signed)
Inpatient Diabetes Program Recommendations  AACE/ADA: New Consensus Statement on Inpatient Glycemic Control (2015)  Target Ranges:  Prepandial:   less than 140 mg/dL      Peak postprandial:   less than 180 mg/dL (1-2 hours)      Critically ill patients:  140 - 180 mg/dL    Results for Farrey, Henson D. (MRN [id number]) as of 03/30/2019 12:22  Ref. Range 03/29/2019 07:27 03/29/2019 10:29 03/29/2019 16:20 03/29/2019 21:53 03/30/2019 02:14 03/30/2019 07:03 03/30/2019 11:39  Glucose-Capillary Latest Ref Range: 70 - 99 mg/dL 233 (H) 270 (H) 428 (H) >600 (HH) 321 (H) 102 (H) 201 (H)    History: DM, Depression, Homelessness   Home DM Meds: Lantus 35 units Daily                              Novolog 6 units TID   Current Orders: Lantus 35 units QHS                            Novolog Sensitive Correction Scale/ SSI (0-9 units) TID AC      Novolog 3 units TID with meals    PCP: [facility] (gets Insulin at [facility])     MD- Glucose increases significantly after meal still after Novolog 3 units meal coverage.  Please consider the following:   -    Increase Novolog Meal Coverage to: Novolog 5 units TID with meals  (Please add the following Hold Parameters: Hold if pt eats <50% of meal, Hold if pt NPO)   --Will follow patient during hospitalization--  Reinhold Rickey RN, MSN, BC-ADM Inpatient Diabetes Coordinator Team Pager [phone] (8a-5p)  

## 2019-03-31 NOTE — Plan of Care (Signed)
Patient is appropriate in the unit.Denies SI,HI and AVH.Ambulates with walker.Appetite and energy level good.Support and encouragement given.

## 2019-04-01 LAB — GLUCOSE, CAPILLARY
Glucose-Capillary: 197 mg/dL — ABNORMAL HIGH (ref 70–99)
Glucose-Capillary: 308 mg/dL — ABNORMAL HIGH (ref 70–99)
Glucose-Capillary: 138 mg/dL — ABNORMAL HIGH (ref 70–99)
Glucose-Capillary: 323 mg/dL — ABNORMAL HIGH (ref 70–99)

## 2019-04-01 NOTE — Plan of Care (Signed)
D: Pt during assessments denies SI/HI/AVH, able to contract verbally for safety. Pt is pleasant and cooperative, endorses a normal mood. Patient Interaction appropriate and engaging. Pt. Reports mood better today then yesterday. Pt. Reports some minor pain from utilizing walker for safety, but denies PRN medicines offered.   A: Q x 15 minute observation checks in place for safety. Patient was and is provided with education throughout shift.  Patient was and will be given/offered medications per orders. Patient was and is encouraged to attend groups, participate in unit activities and continue with plan of care. Pt. Chart and plans of care reviewed. Pt. Given support and encouragement.   R: Patient is complaint with medication and unit procedures. Pt. Blood sugars monitored for safety. Pt. High falls risk education and interventions in place for safety. Pt. Observed out frequently socializing with peers.    Problem: Education: Goal: Knowledge of [facility] Education information/materials will improve Outcome: Progressing Goal: Emotional status will improve Outcome: Progressing Goal: Mental status will improve Outcome: Progressing Goal: Verbalization of understanding the information provided will improve Outcome: Progressing   Problem: Activity: Goal: Interest or engagement in activities will improve Outcome: Progressing   Problem: Coping: Goal: Ability to demonstrate self-control will improve Outcome: Progressing   Problem: Safety: Goal: Periods of time without injury will increase Outcome: Progressing

## 2019-04-01 NOTE — Tx Team (Signed)
Interdisciplinary Treatment and Diagnostic Plan Update  04/01/2019 Time of Session: 900am Cody Campbell MRN: [id number]  Principal Diagnosis: Adjustment disorder with mixed anxiety and depressed mood  Secondary Diagnoses: Principal Problem:   Adjustment disorder with mixed anxiety and depressed mood Active Problems:   DM (diabetes mellitus), type 2, uncontrolled ([place])   Hypertension   Diabetic neuropathy ([place])   Current Medications:  Current Facility-Administered Medications  Medication Dose Route Frequency Provider Last Rate Last Dose  . fluticasone (FLONASE) 50 MCG/ACT nasal spray 2 spray  2 spray Each Nare Daily Clapacs, Madie Reno, MD   2 spray at 04/01/19 [phone]  . gabapentin (NEURONTIN) tablet 600 mg  600 mg Oral TID Clapacs, John T, MD   600 mg at 04/01/19 3546  . hydrOXYzine (ATARAX/VISTARIL) tablet 25 mg  25 mg Oral Q6H PRN Caroline Sauger, NP      . insulin aspart (novoLOG) injection 0-9 Units  0-9 Units Subcutaneous TID WC Clapacs, Madie Reno, MD   5 Units at 03/31/19 1710  . insulin aspart (novoLOG) injection 3 Units  3 Units Subcutaneous TID WC Clapacs, Madie Reno, MD   3 Units at 04/01/19 0825  . insulin glargine (LANTUS) injection 35 Units  35 Units Subcutaneous Daily Clapacs, Madie Reno, MD   35 Units at 03/31/19 2232  . loperamide (IMODIUM) capsule 4 mg  4 mg Oral PRN Clapacs, Madie Reno, MD   4 mg at 03/28/19 1708  . magnesium hydroxide (MILK OF MAGNESIA) suspension 30 mL  30 mL Oral Daily PRN Caroline Sauger, NP      . ondansetron (ZOFRAN) tablet 4 mg  4 mg Oral Q6H PRN Clapacs, John T, MD      . oxymetazoline (AFRIN) 0.05 % nasal spray 1 spray  1 spray Each Nare BID Clapacs, Madie Reno, MD   1 spray at 04/01/19 0825  . pantoprazole (PROTONIX) EC tablet 40 mg  40 mg Oral Daily Money, Lowry Ram, FNP   40 mg at 04/01/19 5681  . simethicone (MYLICON) chewable tablet 80 mg  80 mg Oral QID PRN Money, Lowry Ram, FNP       PTA Medications: Medications Prior to Admission  Medication Sig  Dispense Refill Last Dose  . aspirin-sod bicarb-citric acid (ALKA-SELTZER) 325 MG TBEF tablet Take 325 mg by mouth daily as needed (for acid).     . ferrous sulfate (FERROUSUL) 325 (65 FE) MG tablet Take 1 tablet (325 mg total) by mouth 2 (two) times daily with a meal. 60 tablet 3   . gabapentin (NEURONTIN) 300 MG capsule Take 2 capsules (600 mg total) by mouth 3 (three) times daily. 180 capsule 3   . hydrALAZINE (APRESOLINE) 25 MG tablet Take 1 tablet (25 mg total) by mouth every 6 (six) hours as needed (SBP greater than 170 mmHg). 6 tablet 0   . insulin aspart (NOVOLOG) 100 UNIT/ML injection Inject 0-12 Units into the skin 3 (three) times daily with meals. As per sliding scale (Patient taking differently: Inject 6 Units into the skin 3 (three) times daily with meals. As per sliding scale) 10 mL 3   . insulin glargine (LANTUS) 100 UNIT/ML injection Inject 0.4 mLs (40 Units total) into the skin 2 (two) times daily. (Patient taking differently: Inject 35 Units into the skin daily. ) 30 mL 3   . Insulin Syringe-Needle U-100 (B-D INS SYR ULTRAFINE 1CC/31G) 31G X 5/16" 1 ML MISC Use for administration of insulin. 100 each 12   . sertraline (ZOLOFT) 50 MG tablet  Take 1 tablet (50 mg total) by mouth daily. 4 tablet 0     Patient Stressors:    Patient Strengths:    Treatment Modalities: Medication Management, Group therapy, Case management,  1 to 1 session with clinician, Psychoeducation, Recreational therapy.   Physician Treatment Plan for Primary Diagnosis: Adjustment disorder with mixed anxiety and depressed mood Long Term Goal(s): Improvement in symptoms so as ready for discharge Improvement in symptoms so as ready for discharge   Short Term Goals: Ability to verbalize feelings will improve Ability to demonstrate self-control will improve Ability to identify and develop effective coping behaviors will improve Ability to maintain clinical measurements within normal limits will  improve  Medication Management: Evaluate patient's response, side effects, and tolerance of medication regimen.  Therapeutic Interventions: 1 to 1 sessions, Unit Group sessions and Medication administration.  Evaluation of Outcomes: Progressing  Physician Treatment Plan for Secondary Diagnosis: Principal Problem:   Adjustment disorder with mixed anxiety and depressed mood Active Problems:   DM (diabetes mellitus), type 2, uncontrolled (HCC)   Hypertension   Diabetic neuropathy (HCC)  Long Term Goal(s): Improvement in symptoms so as ready for discharge Improvement in symptoms so as ready for discharge   Short Term Goals: Ability to verbalize feelings will improve Ability to demonstrate self-control will improve Ability to identify and develop effective coping behaviors will improve Ability to maintain clinical measurements within normal limits will improve     Medication Management: Evaluate patient's response, side effects, and tolerance of medication regimen.  Therapeutic Interventions: 1 to 1 sessions, Unit Group sessions and Medication administration.  Evaluation of Outcomes: Progressing   RN Treatment Plan for Primary Diagnosis: Adjustment disorder with mixed anxiety and depressed mood Long Term Goal(s): Knowledge of disease and therapeutic regimen to maintain health will improve  Short Term Goals: Ability to verbalize frustration and anger appropriately will improve, Ability to participate in decision making will improve, Ability to disclose and discuss suicidal ideas and Ability to identify and develop effective coping behaviors will improve  Medication Management: RN will administer medications as ordered by provider, will assess and evaluate patient's response and provide education to patient for prescribed medication. RN will report any adverse and/or side effects to prescribing provider.  Therapeutic Interventions: 1 on 1 counseling sessions, Psychoeducation, Medication  administration, Evaluate responses to treatment, Monitor vital signs and CBGs as ordered, Perform/monitor CIWA, COWS, AIMS and Fall Risk screenings as ordered, Perform wound care treatments as ordered.  Evaluation of Outcomes: Progressing   LCSW Treatment Plan for Primary Diagnosis: Adjustment disorder with mixed anxiety and depressed mood Long Term Goal(s): Safe transition to appropriate next level of care at discharge, Engage patient in therapeutic group addressing interpersonal concerns.  Short Term Goals: Engage patient in aftercare planning with referrals and resources, Increase social support, Identify triggers associated with mental health/substance abuse issues and Increase skills for wellness and recovery  Therapeutic Interventions: Assess for all discharge needs, 1 to 1 time with Social worker, Explore available resources and support systems, Assess for adequacy in community support network, Educate family and significant other(s) on suicide prevention, Complete Psychosocial Assessment, Interpersonal group therapy.  Evaluation of Outcomes: Progressing   Progress in Treatment: Attending groups: Yes. Participating in groups: Yes. Taking medication as prescribed: Yes. Toleration medication: Yes. Family/Significant other contact made: No, will contact:  son: attempts made Patient understands diagnosis: Yes. Discussing patient identified problems/goals with staff: Yes. Medical problems stabilized or resolved: Yes. Denies suicidal/homicidal ideation: Yes. Issues/concerns per patient self-inventory: No. Other: N/A  New problem(s) identified: No, Describe:  none  New Short Term/Long Term Goal(s): Detox, elimination of AVH/symptoms of psychosis, medication management for mood stabilization; elimination of SI thoughts; development of comprehensive mental wellness/sobriety plan.   Patient Goals:  "getting my health straight and get a roof over my head"  Discharge Plan or Barriers:  SPE pamphlet, Mobile Crisis information, and AA/NA information provided to patient for additional community support and resources. Pt declined referral for mental health treatment reporting he does not need it. CSW assessing for appropriate referrals.  Reason for Continuation of Hospitalization: Depression Medication stabilization  Estimated Length of Stay: 3-5 days  Recreational Therapy: Patient: N/A Patient Goal: Patient will engage in groups without prompting or encouragement from LRT x3 group sessions within 5 recreation therapy group sessions  Attendees: Patient: 04/01/2019   Physician: Dr Jeannine KittenFarah MD 04/01/2019   Nursing: Addison NaegeliSlade Reynolds, RN 04/01/2019   RN Care Manager: 04/01/2019   Social Worker: Daleen SquibbGreg Gerald Honea, LCSW 04/01/2019   Recreational Therapist:  04/01/2019   Other:  04/01/2019   Other:  04/01/2019   Other: 04/01/2019        Scribe for Treatment Team: Cody Campbell, Georgiana Spillane Jon, LCSW 04/01/2019 10:43 AM

## 2019-04-01 NOTE — Plan of Care (Signed)
Care of patient taken over at 2300. He spent most of the shift resting in bed quietly. Up x1 through out the night to check the clock and he is back in  bed resting quietly .

## 2019-04-01 NOTE — Progress Notes (Addendum)
D - Patient was in the day room upon arrival. Patient was pleasant during assessment denying SI/HI/AVH, pain, anxiety and depression.  Patient was educated today on a carb modified diet and ate carb modified snack this evening.   A - Patient compliant with medication administration per MD orders.  Patient given education. Patient given support and encouragement to be active in his treatment plan. Patient informed to let staff know if there are any issues or problems on the unit.   R - Patient being monitored Q 15 minutes for safety per unit protocol. Patient remains safe on the unit. 

## 2019-04-01 NOTE — Progress Notes (Signed)
[facility]  04/01/2019 9:15 AM Cody Campbell  MRN:  [id number] Subjective:    Cody Campbell states he is here for "depression" but declines to take antidepressant stating "I do not want that Abilify or any of that s--t" States he is homeless and wants the social worker to "find him housing in some benefits" Poorly cooperative with the interview process somewhat disrespectful to the process and will not answer all questions on the mental status exam. However he denies thoughts of harming self or others and understands what it is to contract for safety and will contract for safety here or if he leaves  Principal Problem: Adjustment disorder with mixed anxiety and depressed mood Diagnosis: Principal Problem:   Adjustment disorder with mixed anxiety and depressed mood Active Problems:   DM (diabetes mellitus), type 2, uncontrolled (HCC)   Hypertension   Diabetic neuropathy (HCC)  Total Time spent with patient: 20 minutes  Past Psychiatric History: States he has depression but does not want an antidepressant  Past Medical History:  Past Medical History:  Diagnosis Date  . Diabetes mellitus without complication (HCC)   . Elevated LFTs 05/21/2011  . Hyperlipidemia   . Hypertension    History reviewed. No pertinent surgical history. Family History:  Family History  Problem Relation Age of Onset  . Diabetes Mother   . Hypertension Father   . Diabetes Father   . Heart disease Father    Family Psychiatric  History: No new data shared Social History:  Social History   Substance and Sexual Activity  Alcohol Use Yes  . Alcohol/week: 1.0 standard drinks  . Types: 1 Shots of liquor per week   Comment: occasionally, 1 beer a day     Social History   Substance and Sexual Activity  Drug Use Yes  . Types: Marijuana    Social History   Socioeconomic History  . Marital status: Married    Spouse name: Not on file  . Number of children: Not on file  . Years of education:  Not on file  . Highest education level: Not on file  Occupational History  . Not on file  Social Needs  . Financial resource strain: Not on file  . Food insecurity    Worry: Not on file    Inability: Not on file  . Transportation needs    Medical: Not on file    Non-medical: Not on file  Tobacco Use  . Smoking status: Current Every Day Smoker    Packs/day: 2.00    Years: 35.00    Pack years: 70.00    Types: Cigars  . Smokeless tobacco: Current User  . Tobacco comment: Smoking 1-2 small cigars daily  Substance and Sexual Activity  . Alcohol use: Yes    Alcohol/week: 1.0 standard drinks    Types: 1 Shots of liquor per week    Comment: occasionally, 1 beer a day  . Drug use: Yes    Types: Marijuana  . Sexual activity: Yes    Partners: Female    Birth control/protection: Condom  Lifestyle  . Physical activity    Days per week: Not on file    Minutes per session: Not on file  . Stress: Not on file  Relationships  . Social Musicianconnections    Talks on phone: Not on file    Gets together: Not on file    Attends religious service: Not on file    Active member of club or organization: Not on file  Attends meetings of clubs or organizations: Not on file    Relationship status: Not on file  Other Topics Concern  . Not on file  Social History Narrative  . Not on file   Additional Social History:                         Sleep: Fair  Appetite:  Fair  Current Medications: Current Facility-Administered Medications  Medication Dose Route Frequency Provider Last Rate Last Dose  . fluticasone (FLONASE) 50 MCG/ACT nasal spray 2 spray  2 spray Each Nare Daily Clapacs, Madie Reno, MD   2 spray at 04/01/19 [phone]  . gabapentin (NEURONTIN) tablet 600 mg  600 mg Oral TID Clapacs, John T, MD   600 mg at 04/01/19 0177  . hydrOXYzine (ATARAX/VISTARIL) tablet 25 mg  25 mg Oral Q6H PRN Caroline Sauger, NP      . insulin aspart (novoLOG) injection 0-9 Units  0-9 Units Subcutaneous  TID WC Clapacs, Madie Reno, MD   5 Units at 03/31/19 1710  . insulin aspart (novoLOG) injection 3 Units  3 Units Subcutaneous TID WC Clapacs, Madie Reno, MD   3 Units at 04/01/19 0825  . insulin glargine (LANTUS) injection 35 Units  35 Units Subcutaneous Daily Clapacs, Madie Reno, MD   35 Units at 03/31/19 2232  . loperamide (IMODIUM) capsule 4 mg  4 mg Oral PRN Clapacs, Madie Reno, MD   4 mg at 03/28/19 1708  . magnesium hydroxide (MILK OF MAGNESIA) suspension 30 mL  30 mL Oral Daily PRN Caroline Sauger, NP      . ondansetron (ZOFRAN) tablet 4 mg  4 mg Oral Q6H PRN Clapacs, John T, MD      . oxymetazoline (AFRIN) 0.05 % nasal spray 1 spray  1 spray Each Nare BID Clapacs, Madie Reno, MD   1 spray at 04/01/19 0825  . pantoprazole (PROTONIX) EC tablet 40 mg  40 mg Oral Daily Money, Lowry Ram, FNP   40 mg at 04/01/19 9390  . simethicone (MYLICON) chewable tablet 80 mg  80 mg Oral QID PRN Money, Lowry Ram, FNP        Lab Results:  Results for orders placed or performed during the hospital encounter of 03/23/19 (from the past 48 hour(s))  Glucose, capillary     Status: Abnormal   Collection Time: 03/30/19 11:39 AM  Result Value Ref Range   Glucose-Capillary 201 (H) 70 - 99 mg/dL  Glucose, capillary     Status: Abnormal   Collection Time: 03/30/19  4:24 PM  Result Value Ref Range   Glucose-Capillary 213 (H) 70 - 99 mg/dL   Comment 1 Document in Chart   Glucose, capillary     Status: Abnormal   Collection Time: 03/30/19  8:58 PM  Result Value Ref Range   Glucose-Capillary 179 (H) 70 - 99 mg/dL   Comment 1 Notify RN   Glucose, capillary     Status: Abnormal   Collection Time: 03/31/19  7:08 AM  Result Value Ref Range   Glucose-Capillary 209 (H) 70 - 99 mg/dL  Glucose, capillary     Status: Abnormal   Collection Time: 03/31/19 11:35 AM  Result Value Ref Range   Glucose-Capillary 153 (H) 70 - 99 mg/dL  Glucose, capillary     Status: Abnormal   Collection Time: 03/31/19  4:17 PM  Result Value Ref Range    Glucose-Capillary 298 (H) 70 - 99 mg/dL  Glucose, capillary  Status: Abnormal   Collection Time: 03/31/19  6:56 PM  Result Value Ref Range   Glucose-Capillary 168 (H) 70 - 99 mg/dL  Glucose, capillary     Status: None   Collection Time: 03/31/19  8:24 PM  Result Value Ref Range   Glucose-Capillary 98 70 - 99 mg/dL   Comment 1 Notify RN   Glucose, capillary     Status: Abnormal   Collection Time: 03/31/19 10:27 PM  Result Value Ref Range   Glucose-Capillary 146 (H) 70 - 99 mg/dL   Comment 1 Notify RN   Glucose, capillary     Status: Abnormal   Collection Time: 04/01/19  6:44 AM  Result Value Ref Range   Glucose-Capillary 197 (H) 70 - 99 mg/dL    Blood Alcohol level:  No results found for: [facility]  Metabolic Disorder Labs: Lab Results  Component Value Date   HGBA1C 10.0 (H) 03/18/2019   MPG 240 03/18/2019   No results found for: PROLACTIN Lab Results  Component Value Date   CHOL 189 01/25/2019   TRIG 82 01/25/2019   HDL 71 01/25/2019   CHOLHDL 2.7 01/25/2019   VLDL 11 05/15/2013   LDLCALC 102 (H) 01/25/2019   LDLCALC 80 05/15/2013   Musculoskeletal: Strength & Muscle Tone: within normal limits Gait & Station: normal Patient leans: N/A  Psychiatric Specialty Exam: Physical Exam  ROS  Blood pressure 106/67, pulse 92, temperature 98 F (36.7 C), temperature source Oral, resp. rate 18, height 6\' 2"  (1.88 m), weight 74.8 kg, SpO2 100 %.Body mass index is 21.17 kg/m.  General Appearance: Casual  Eye Contact:  Minimal  Speech:  Normal Rate  Volume:  Decreased  Mood:  Irritable  Affect:  Restricted  Thought Process:  Linear  Orientation:  Full (Time, Place, and Person)  Thought Content:  Denies hallucinations or delusional content  Suicidal Thoughts:  No  Homicidal Thoughts:  No  Memory:  Immediate;   Fair Recent;   Fair Remote;   Fair  Judgement:  Fair  Insight: Generally intact  Psychomotor Activity:  Normal  Concentration:  Concentration: Fair and  Attention Span: Fair  Recall:  of Knowledge:  Fair  Language:  Fair  Akathisia:  Negative  Handed:  Right  AIMS (if indicated):     Assets:  Physical Health Resilience  ADL's:  Intact  Cognition:  WNL  Sleep:  Number of Hours: 4.25     Treatment Plan Summary: Daily contact with patient to assess and evaluate symptoms and progress in treatment and Medication management  Despite complaining of depressive symptoms requests no medication changes alterations or additions request no antidepressants states he is here for housing purposes and hope social worker can help no change in precautions can contract for safety  [employer], MD 04/01/2019, 9:15 AM

## 2019-04-01 NOTE — Progress Notes (Signed)
Supervisor met with pt to discuss potential discharge plans.  Pt reports he did meet with PT and we discussed looking at any potential nursing placements, but that this would be short term.  We discussed shelter options and pt reports his medical issues are not a good fit for a homeless shelter.  Supervisor talked with him about the [employer].  We discussed any help from his son and pt asked supervisor to reach out to his son first and then he would call as well.    Supervisor spoke with pt son, Cody Campbell.  He was currently in [place] driving a truck.  Gerald Stabs said he had spoken to the MD a little, said he is not in a position to help.  Supervisor discussed that we are pursuing any options but that if nothing works out, pt could end up referred to homeless shelter.  Gerald Stabs said that he may reach out to other family members and see if there would be any potential help, he did not offer for pt to come stay with him.  He said he has a wife and two kids and not a lot of money to help at this time. Winferd Humphrey, MSW, LCSW [facility] Supervisor 04/01/2019 3:56 PM

## 2019-04-01 NOTE — [facility] Group Notes (Signed)
LCSW Aftercare Discharge Planning Group Note   04/01/2019 1300  Type of Group and Topic: Psychoeducational Group:  Discharge Planning  Participation Level:  Active  Description of Group  Discharge planning group reviews patient's anticipated discharge plans and assists patients to anticipate and address any barriers to wellness/recovery in the community.  Suicide prevention education is reviewed with patients in group.  Therapeutic Goals 1. Patients will state their anticipated discharge plan and mental health aftercare 2. Patients will identify potential barriers to wellness in the community setting 3. Patients will engage in problem solving, solution focused discussion of ways to anticipate and address barriers to wellness/recovery  Summary of Patient Progress: pt active in stating that he does not have a good discharge plan yet and talking about that being stressful.  Pt participated in discussion about wellness and how he could address different life areas on a wellness wheel.  Good participation.     Plan for Discharge/Comments:    Transportation Means:   Supports:  Therapeutic Modalities: Motivational Interviewing    Joanne Chars, LCSW 04/01/2019 1:58 PM

## 2019-04-01 NOTE — Plan of Care (Signed)
Patient pleasant, calm and cooperative on the unit  Problem: Education: Goal: Emotional status will improve Outcome: Progressing Goal: Mental status will improve Outcome: Progressing   

## 2019-04-02 LAB — GLUCOSE, CAPILLARY
Glucose-Capillary: 504 mg/dL (ref 70–99)
Glucose-Capillary: 323 mg/dL — ABNORMAL HIGH (ref 70–99)
Glucose-Capillary: 265 mg/dL — ABNORMAL HIGH (ref 70–99)
Glucose-Capillary: 107 mg/dL — ABNORMAL HIGH (ref 70–99)
Glucose-Capillary: 179 mg/dL — ABNORMAL HIGH (ref 70–99)
Glucose-Capillary: 388 mg/dL — ABNORMAL HIGH (ref 70–99)

## 2019-04-02 MED ORDER — IBUPROFEN 600 MG PO TABS
800.0000 mg | ORAL_TABLET | Freq: Two times a day (BID) | ORAL | Status: DC | PRN
  Administered 2019-04-02 – 2019-04-04 (×3): 800 mg via ORAL
  Filled 2019-04-02 (×3): qty 1

## 2019-04-02 NOTE — Plan of Care (Signed)
Patient stated he had a bad morning but his day got better as it went along. Patient was pleasant, calm and cooperative on the unit.   Problem: Education: Goal: Emotional status will improve Outcome: Progressing Goal: Mental status will improve Outcome: Progressing

## 2019-04-02 NOTE — Progress Notes (Signed)
[facility]  04/02/2019 10:36 AM Cody Campbell  MRN:  [id number] Subjective:    Patient very irritable argumentative and unpleasant today.  He is disrespectful to the examiner and other staff, cursing at staff stating we "do not know what were doing" stating that he was given a sugar containing beverage that "drove my sugar up and almost put me in a coma" he will not bump off of his anger. When I explained to him that his sugar was addressed with sliding scale coverage, that if he is dissatisfied with care here he can leave, he becomes even angry or demanding to stay because he is homeless. But he is not suicidal or psychotic, and I explained to him that if he is disrespectful to the staff he can be discharged at any point in time if he is verbally abusing staff  Principal Problem: Adjustment disorder with mixed anxiety and depressed mood Diagnosis: Principal Problem:   Adjustment disorder with mixed anxiety and depressed mood Active Problems:   DM (diabetes mellitus), type 2, uncontrolled (HCC)   Hypertension   Diabetic neuropathy (HCC)  Total Time spent with patient: 30 minutes  Past Psychiatric History: Homelessness/malingering  Past Medical History:  Past Medical History:  Diagnosis Date  . Diabetes mellitus without complication (HCC)   . Elevated LFTs 05/21/2011  . Hyperlipidemia   . Hypertension    History reviewed. No pertinent surgical history. Family History:  Family History  Problem Relation Age of Onset  . Diabetes Mother   . Hypertension Father   . Diabetes Father   . Heart disease Father    Family Psychiatric  History: No new data Social History:  Social History   Substance and Sexual Activity  Alcohol Use Yes  . Alcohol/week: 1.0 standard drinks  . Types: 1 Shots of liquor per week   Comment: occasionally, 1 beer a day     Social History   Substance and Sexual Activity  Drug Use Yes  . Types: Marijuana    Social History   Socioeconomic  History  . Marital status: Married    Spouse name: Not on file  . Number of children: Not on file  . Years of education: Not on file  . Highest education level: Not on file  Occupational History  . Not on file  Social Needs  . Financial resource strain: Not on file  . Food insecurity    Worry: Not on file    Inability: Not on file  . Transportation needs    Medical: Not on file    Non-medical: Not on file  Tobacco Use  . Smoking status: Current Every Day Smoker    Packs/day: 2.00    Years: 35.00    Pack years: 70.00    Types: Cigars  . Smokeless tobacco: Current User  . Tobacco comment: Smoking 1-2 small cigars daily  Substance and Sexual Activity  . Alcohol use: Yes    Alcohol/week: 1.0 standard drinks    Types: 1 Shots of liquor per week    Comment: occasionally, 1 beer a day  . Drug use: Yes    Types: Marijuana  . Sexual activity: Yes    Partners: Female    Birth control/protection: Condom  Lifestyle  . Physical activity    Days per week: Not on file    Minutes per session: Not on file  . Stress: Not on file  Relationships  . Social connections    Talks on phone: Not on file  Gets together: Not on file    Attends religious service: Not on file    Active member of club or organization: Not on file    Attends meetings of clubs or organizations: Not on file    Relationship status: Not on file  Other Topics Concern  . Not on file  Social History Narrative  . Not on file   Additional Social History:                         Sleep: Good  Appetite:  Good  Current Medications: Current Facility-Administered Medications  Medication Dose Route Frequency Provider Last Rate Last Dose  . fluticasone (FLONASE) 50 MCG/ACT nasal spray 2 spray  2 spray Each Nare Daily Clapacs, Madie Reno, MD   2 spray at 04/01/19 [phone]  . gabapentin (NEURONTIN) tablet 600 mg  600 mg Oral TID Clapacs, John T, MD   600 mg at 04/02/19 0823  . hydrOXYzine (ATARAX/VISTARIL) tablet 25  mg  25 mg Oral Q6H PRN Caroline Sauger, NP   25 mg at 04/01/19 2048  . insulin aspart (novoLOG) injection 0-9 Units  0-9 Units Subcutaneous TID WC Clapacs, Madie Reno, MD   9 Units at 04/02/19 0703  . insulin aspart (novoLOG) injection 3 Units  3 Units Subcutaneous TID WC Clapacs, Madie Reno, MD   3 Units at 04/02/19 [phone]  . insulin glargine (LANTUS) injection 35 Units  35 Units Subcutaneous Daily Clapacs, Madie Reno, MD   35 Units at 03/31/19 2232  . loperamide (IMODIUM) capsule 4 mg  4 mg Oral PRN Clapacs, Madie Reno, MD   4 mg at 03/28/19 1708  . magnesium hydroxide (MILK OF MAGNESIA) suspension 30 mL  30 mL Oral Daily PRN Caroline Sauger, NP      . ondansetron (ZOFRAN) tablet 4 mg  4 mg Oral Q6H PRN Clapacs, John T, MD      . oxymetazoline (AFRIN) 0.05 % nasal spray 1 spray  1 spray Each Nare BID Clapacs, Madie Reno, MD   1 spray at 04/02/19 [phone]  . pantoprazole (PROTONIX) EC tablet 40 mg  40 mg Oral Daily Money, Lowry Ram, FNP   40 mg at 04/02/19 1093  . simethicone (MYLICON) chewable tablet 80 mg  80 mg Oral QID PRN Money, Lowry Ram, FNP        Lab Results:  Results for orders placed or performed during the hospital encounter of 03/23/19 (from the past 48 hour(s))  Glucose, capillary     Status: Abnormal   Collection Time: 03/31/19 11:35 AM  Result Value Ref Range   Glucose-Capillary 153 (H) 70 - 99 mg/dL  Glucose, capillary     Status: Abnormal   Collection Time: 03/31/19  4:17 PM  Result Value Ref Range   Glucose-Capillary 298 (H) 70 - 99 mg/dL  Glucose, capillary     Status: Abnormal   Collection Time: 03/31/19  6:56 PM  Result Value Ref Range   Glucose-Capillary 168 (H) 70 - 99 mg/dL  Glucose, capillary     Status: None   Collection Time: 03/31/19  8:24 PM  Result Value Ref Range   Glucose-Capillary 98 70 - 99 mg/dL   Comment 1 Notify RN   Glucose, capillary     Status: Abnormal   Collection Time: 03/31/19 10:27 PM  Result Value Ref Range   Glucose-Capillary 146 (H) 70 - 99 mg/dL    Comment 1 Notify RN   Glucose, capillary  Status: Abnormal   Collection Time: 04/01/19  6:44 AM  Result Value Ref Range   Glucose-Capillary 197 (H) 70 - 99 mg/dL  Glucose, capillary     Status: Abnormal   Collection Time: 04/01/19 11:04 AM  Result Value Ref Range   Glucose-Capillary 308 (H) 70 - 99 mg/dL  Glucose, capillary     Status: Abnormal   Collection Time: 04/01/19  4:00 PM  Result Value Ref Range   Glucose-Capillary 138 (H) 70 - 99 mg/dL   Comment 1 Notify RN   Glucose, capillary     Status: Abnormal   Collection Time: 04/01/19  8:43 PM  Result Value Ref Range   Glucose-Capillary 323 (H) 70 - 99 mg/dL   Comment 1 Notify RN   Glucose, capillary     Status: Abnormal   Collection Time: 04/02/19  6:58 AM  Result Value Ref Range   Glucose-Capillary 504 (HH) 70 - 99 mg/dL   Comment 1 Notify RN     Blood Alcohol level:  No results found for: [facility]  Metabolic Disorder Labs: Lab Results  Component Value Date   HGBA1C 10.0 (H) 03/18/2019   MPG 240 03/18/2019   No results found for: PROLACTIN Lab Results  Component Value Date   CHOL 189 01/25/2019   TRIG 82 01/25/2019   HDL 71 01/25/2019   CHOLHDL 2.7 01/25/2019   VLDL 11 05/15/2013   LDLCALC 102 (H) 01/25/2019   LDLCALC 80 05/15/2013    Musculoskeletal: Strength & Muscle Tone: within normal limits Gait & Station: normal Patient leans: N/A  Psychiatric Specialty Exam: Physical Exam  ROS  Blood pressure 106/67, pulse (!) 108, temperature 98.2 F (36.8 C), temperature source Oral, resp. rate 18, height 6\' 2"  (1.88 m), weight 74.8 kg, SpO2 100 %.Body mass index is 21.17 kg/m.  General Appearance: Casual  Eye Contact:  Minimal  Speech:  Pressured  Volume:  Increased  Mood:  Angry and Irritable  Affect:  Labile  Thought Process:  Coherent, Goal Directed and Descriptions of Associations: Circumstantial  Orientation:  Full (Time, Place, and Person)  Thought Content:  Denies hallucinations no paranoia no  thoughts of harming self or others  Suicidal Thoughts:  No  Homicidal Thoughts:  No  Memory:  Immediate;   Fair Recent;   Fair Remote;   Fair  Judgement:  Fair  Insight:  Fair  Psychomotor Activity:  Normal  Concentration:  Concentration: Fair and Attention Span: Fair  Recall:  of Knowledge:  Fair  Language:  Fair  Akathisia:  Negative  Handed:  Right  AIMS (if indicated):     Assets:  Leisure Time Physical Health  ADL's:  Intact  Cognition:  WNL  Sleep:  Number of Hours: 7.5     Treatment Plan Summary: Daily contact with patient to assess and evaluate symptoms and progress in treatment  Patient is reminded that he can be discharged at any time for being verbally abusive to staff as he is not suicidal or psychotic  We are holding him as we know that it would be difficult to place him in a shelter with his diabetes but hopefully this can be accommodated soon. No change in precautions or medications  Robbye Dede, MD 04/02/2019, 10:36 AM

## 2019-04-02 NOTE — [facility] Group Notes (Signed)
LCSW Group Therapy Note 04/02/2019 1:15pm  Type of Therapy and Topic: Group Therapy: Feelings Around Returning Home & Establishing a Supportive Framework and Supporting Oneself When Supports Not Available  Participation Level: Did Not Attend  Description of Group:  Patients first processed thoughts and feelings about upcoming discharge. These included fears of upcoming changes, lack of change, new living environments, judgements and expectations from others and overall stigma of mental health issues. The group then discussed the definition of a supportive framework, what that looks and feels like, and how do to discern it from an unhealthy non-supportive network. The group identified different types of supports as well as what to do when your family/friends are less than helpful or unavailable  Therapeutic Goals  1. Patient will identify one healthy supportive network that they can use at discharge. 2. Patient will identify one factor of a supportive framework and how to tell it from an unhealthy network. 3. Patient able to identify one coping skill to use when they do not have positive supports from others. 4. Patient will demonstrate ability to communicate their needs through discussion and/or role plays.  Summary of Patient Progress:  Pt was invited to attend group but chose not to attend. CSW will continue to encourage pt to attend group throughout their admission.    Therapeutic Modalities Cognitive Behavioral Therapy Motivational Interviewing   Cheree Ditto, LCSW 04/02/2019 12:37 PM

## 2019-04-02 NOTE — Progress Notes (Signed)
D - Patient was in the day room upon arrival. Patient was pleasant during assessment denying SI/HI/AVH, pain, anxiety and depression.  Patient was educated today on a carb modified diet and ate carb modified snack this evening.   A - Patient compliant with medication administration per MD orders.  Patient given education. Patient given support and encouragement to be active in his treatment plan. Patient informed to let staff know if there are any issues or problems on the unit.   R - Patient being monitored Q 15 minutes for safety per unit protocol. Patient remains safe on the unit.

## 2019-04-02 NOTE — Progress Notes (Signed)
Phase 1 Adult ICU Glycemic Control Protocol - Subcutaneous Insulin 1. Monitor CBG every 4 hours 2. Administer correction coverage with insulin aspart (NOVOLOG) subcutaneously every 4 hours according to the sliding scale 3. If CBG < 70 mg/dL refer to Hypoglycemia Standing Orders and Hypoglycemia Standing Orders Sidebar report a. In Order Management under Order Sets search for Hypoglycemia Standing Orders b. Place orders per Standing Orders: Cosign required 4. If there is one CBG > 250 mg/dL or two subsequent CBG > 200 mg/dL a. Search for Phase 2 Adult ICU Glycemic Control Protocol - IV Insulin in Order Management under Order Sets and place orders per Protocol: Cosign required. b. In Order Management select Active Orders and View by Order Set. Discontinue ALL orders from the Phase 1 Adult ICU Glycemic Control - Subcutaneous Insulin orderset per Protocol: Cosign required.  Patients BS was 507 this mornings. 9 units were given per MD orders. Patient will be rechecked in 4 hrs at 1100

## 2019-04-02 NOTE — Progress Notes (Signed)
Inpatient Diabetes Program Recommendations  AACE/ADA: New Consensus Statement on Inpatient Glycemic Control   Target Ranges:  Prepandial:   less than 140 mg/dL      Peak postprandial:   less than 180 mg/dL (1-2 hours)      Critically ill patients:  140 - 180 mg/dL   Results for TRAMAR, BRUECKNER (MRN [id number]) as of 04/02/2019 07:26  Ref. Range 04/01/2019 06:44 04/01/2019 11:04 04/01/2019 16:00 04/01/2019 20:43 04/02/2019 06:58  Glucose-Capillary Latest Ref Range: 70 - 99 mg/dL 197 (H) 308 (H) 138 (H) 323 (H) 504 (HH)   Review of Glycemic Control  History:DM, Depression, Homelessness   Home DM Meds: Lantus 35units Daily Novolog 6 units TID   Current Orders: Lantus 35 units QHS, Novolog 0-9 units TID with meals, Novolog 3 units TID with meals  PCP: [facility] (gets Insulin at [place])  Inpatient Diabetes Program Recommendations:   Insulin-Basal: Noted patient refused Lantus last night. As a result glucose up to 504 mg/dl this morning. Recommend ordering BMET to ensure patient is not in DKA. IF patient is not in DKA, please change Lantus to 35 units daily (to start now).  Insulin-Correction: Please consider ordering Novolog 0-5 units QHS for bedtime correction.  Insulin-Meal Coverage: Please consider increasing meal coverage to Novolog 5 units TID with meals.  Thanks, Barnie Alderman, RN, MSN, CDE Diabetes Coordinator Inpatient Diabetes Program [phone] (Team Pager from 8am to 5pm)

## 2019-04-02 NOTE — Plan of Care (Signed)
Patient was irritable for his high blood sugar and blaming staff for letting him have the drink with sugar.Ambulates with walker.Denies SI,HI and AVH.Compliant with medications.Appetite and energy level good.Support and encouragement given.

## 2019-04-03 DIAGNOSIS — E1165 Type 2 diabetes mellitus with hyperglycemia: Secondary | ICD-10-CM

## 2019-04-03 DIAGNOSIS — E1140 Type 2 diabetes mellitus with diabetic neuropathy, unspecified: Secondary | ICD-10-CM

## 2019-04-03 DIAGNOSIS — I10 Essential (primary) hypertension: Secondary | ICD-10-CM

## 2019-04-03 LAB — GLUCOSE, CAPILLARY
Glucose-Capillary: 523 mg/dL (ref 70–99)
Glucose-Capillary: 372 mg/dL — ABNORMAL HIGH (ref 70–99)
Glucose-Capillary: 258 mg/dL — ABNORMAL HIGH (ref 70–99)
Glucose-Capillary: 137 mg/dL — ABNORMAL HIGH (ref 70–99)
Glucose-Capillary: 163 mg/dL — ABNORMAL HIGH (ref 70–99)

## 2019-04-03 MED ORDER — INSULIN ASPART 100 UNIT/ML ~~LOC~~ SOLN
0.0000 [IU] | Freq: Three times a day (TID) | SUBCUTANEOUS | Status: DC
  Administered 2019-04-03: 9 [IU] via SUBCUTANEOUS
  Administered 2019-04-03: 17:00:00 5 [IU] via SUBCUTANEOUS
  Administered 2019-04-04: 17:00:00 3 [IU] via SUBCUTANEOUS
  Administered 2019-04-04: 07:00:00 9 [IU] via SUBCUTANEOUS
  Administered 2019-04-04: 11:00:00 5 [IU] via SUBCUTANEOUS
  Administered 2019-04-05: 12:00:00 2 [IU] via SUBCUTANEOUS
  Administered 2019-04-05: 16:00:00 0 [IU] via SUBCUTANEOUS
  Administered 2019-04-05: 08:00:00 3 [IU] via SUBCUTANEOUS
  Administered 2019-04-06 (×2): 5 [IU] via SUBCUTANEOUS
  Administered 2019-04-07: 08:00:00 3 [IU] via SUBCUTANEOUS
  Filled 2019-04-03 (×6): qty 1

## 2019-04-03 MED ORDER — INSULIN GLARGINE 100 UNIT/ML ~~LOC~~ SOLN
20.0000 [IU] | Freq: Every day | SUBCUTANEOUS | Status: DC
  Administered 2019-04-05 – 2019-04-07 (×3): 20 [IU] via SUBCUTANEOUS
  Filled 2019-04-03 (×4): qty 0.2

## 2019-04-03 MED ORDER — INSULIN GLARGINE 100 UNIT/ML ~~LOC~~ SOLN
15.0000 [IU] | Freq: Every day | SUBCUTANEOUS | Status: DC
  Filled 2019-04-03: qty 0.15

## 2019-04-03 MED ORDER — INSULIN GLARGINE 100 UNIT/ML ~~LOC~~ SOLN
15.0000 [IU] | Freq: Every day | SUBCUTANEOUS | Status: DC
  Administered 2019-04-04 – 2019-04-06 (×3): 15 [IU] via SUBCUTANEOUS
  Filled 2019-04-03 (×4): qty 0.15

## 2019-04-03 MED ORDER — INSULIN GLARGINE 100 UNIT/ML ~~LOC~~ SOLN
35.0000 [IU] | Freq: Every day | SUBCUTANEOUS | Status: DC
  Administered 2019-04-03: 12:00:00 35 [IU] via SUBCUTANEOUS
  Filled 2019-04-03 (×2): qty 0.35

## 2019-04-03 MED ORDER — INSULIN ASPART 100 UNIT/ML ~~LOC~~ SOLN
0.0000 [IU] | Freq: Every day | SUBCUTANEOUS | Status: DC
  Administered 2019-04-04: 4 [IU] via SUBCUTANEOUS
  Administered 2019-04-06: 21:00:00 3 [IU] via SUBCUTANEOUS
  Filled 2019-04-03 (×3): qty 1

## 2019-04-03 MED ORDER — INSULIN ASPART 100 UNIT/ML ~~LOC~~ SOLN
5.0000 [IU] | Freq: Three times a day (TID) | SUBCUTANEOUS | Status: DC
  Administered 2019-04-03 – 2019-04-07 (×12): 5 [IU] via SUBCUTANEOUS
  Filled 2019-04-03 (×11): qty 1

## 2019-04-03 NOTE — Progress Notes (Signed)
Patient given 12 units per MD orders for BS of 527. Patient will be rechecked in 4hrs at 1100  Phase 1 Adult ICU Glycemic Control Protocol - Subcutaneous Insulin 1. Monitor CBG every 4 hours 2. Administer correction coverage with insulin aspart (NOVOLOG) subcutaneously every 4 hours according to the sliding scale 3. If CBG < 70 mg/dL refer to Hypoglycemia Standing Orders and Hypoglycemia Standing Orders Sidebar report a. In Order Management under Order Sets search for Hypoglycemia Standing Orders b. Place orders per Standing Orders: Cosign required 4. If there is one CBG > 250 mg/dL or two subsequent CBG > 200 mg/dL a. Search for Phase 2 Adult ICU Glycemic Control Protocol - IV Insulin in Order Management under Order Sets and place orders per Protocol: Cosign required. b. In Order Management select Active Orders and View by Order Set. Discontinue ALL orders from the Phase 1 Adult ICU Glycemic Control - Subcutaneous Insulin orderset per Protocol: Cosign required.

## 2019-04-03 NOTE — [facility] Group Notes (Signed)
CSW scheduled for Cody Campbell from [employer] to come to the unit to assist the patient in applying for Medicaid.    CSW observed Cody Campbell to complete the paperwork with the patient.  Per [name] it can take up to 90 days or longer for the patient to hear an answer regarding the Medicaid application.    [name], MSW, LCSW 04/03/2019 3:33 PM

## 2019-04-03 NOTE — [facility] Group Notes (Signed)

## 2019-04-03 NOTE — Progress Notes (Signed)
Inpatient Diabetes Program Recommendations  AACE/ADA: New Consensus Statement on Inpatient Glycemic Control   Target Ranges:  Prepandial:   less than 140 mg/dL      Peak postprandial:   less than 180 mg/dL (1-2 hours)      Critically ill patients:  140 - 180 mg/dL   Results for Cody Campbell, [name] (MRN [id number]) as of 04/03/2019 09:44  Ref. Range 04/02/2019 06:58 04/02/2019 11:16 04/02/2019 12:33 04/02/2019 15:14 04/02/2019 16:05 04/02/2019 20:53 04/03/2019 06:58  Glucose-Capillary Latest Ref Range: 70 - 99 mg/dL 504 (HH)  Novolog 12 units 323 (H)  Novolog 10 units 265 (H)   107 (H)   179 (H)  Novolog 5 units 388 (H)  Novolog 9 units  REFUSED Lantus 523 (HH)  Novolog 12 units  Results for Cody Campbell, Cody Campbell (MRN [id number]) as of 04/03/2019 09:44  Ref. Range 04/01/2019 06:44 04/01/2019 11:04 04/01/2019 16:00 04/01/2019 20:43  Glucose-Capillary Latest Ref Range: 70 - 99 mg/dL 197 (H)  Novolog 3 units 308 (H)  Novolog 10 units 138 (H)  Novolog 4 units 323 (H)  Novolog 7 units  REFUSED Lantus   Review of Glycemic Control  History:DM, Depression, Homelessness   Home DM Meds: Lantus 35units Daily Novolog 6 units TID   Current Orders: Lantus 35 units daily at bedtime, Novolog 0-9 units TID with meals, Novolog 3 units TID with meals  PCP: [facility] (gets Insulin at [place])  Inpatient Diabetes Program Recommendations:   Insulin-Basal: Noted patient refused Lantus on 04/01/19 and 04/02/19. As a result fasting glucose 504 mg/dl on 04/02/19 and 523 mg/dl today.  Please change Lantus to 35 units daily (to start now).  Insulin-Correction: Please consider ordering Novolog 0-5 units QHS for bedtime correction.  Insulin-Meal Coverage: Please consider increasing meal coverage to Novolog 5 units TID with meals.  Thanks, Barnie Alderman, RN, MSN, CDE Diabetes Coordinator Inpatient Diabetes Program [phone] (Team Pager from 8am to 5pm)

## 2019-04-03 NOTE — Progress Notes (Signed)
Recreation Therapy Notes  Date: 04/03/2019  Time: 9:30 am   Location: Craft room   Behavioral response: N/A   Intervention Topic: Anger Management  Discussion/Intervention: Patient did not attend group.   Clinical Observations/Feedback:  Patient did not attend group.   Kenitha Glendinning LRT/CTRS        Natalija Mavis 04/03/2019 12:00 PM

## 2019-04-03 NOTE — Progress Notes (Signed)
PT Cancellation Note  Patient Details Name: Cody Campbell MRN: [id number] DOB: [birth year]   Cancelled Treatment:    Reason Eval/Treat Not Completed: Other (comment). Pt having issues regulating BG. Currently BG is at 523. Not appropriate for PT intervention at this time. Will re-attempt next date, as available.    Toshi Ishii 04/03/2019, 9:02 AM Greggory Stallion, PT, DPT [phone]

## 2019-04-03 NOTE — Progress Notes (Signed)
[facility] MD Progress Note  04/03/2019 3:29 PM Cody Campbell  MRN:  [id number]   Subjective: This is a follow-up for patient has been diagnosed in treated for adjustment disorder with mixed anxiety and depressed mood.  Patient reports today that he had a bad morning and yesterday was not much better.  He reports that lunch pushed him over the edge because he is just not getting enough food.  He states that he knows that he is on a carb modified diet but he feels that no one is listening to him especially the people in dietary because they are not giving him the amount of food that he continues to ask for.  Patient does state that he is come to a decision that if he could just get double portions of protein then that would be easier to satisfy his hunger.  Patient then states that he had a meeting with the lady from [place] today and he is hoping to hear something from them in a very reasonable amount of time so that he can find a place to live.  Patient then complains about having neuropathic pain and at times it can be severe and that he wishes he could have more medication to assist with his pain other than the gabapentin.  Patient also has a complaint about the way his Lantus is prescribed to him.  He states that he does not take 35 units at one time when he is at home and takes a portion of it in the morning and a portion of it in the evening and would like it to be changed.  Patient then continues into a story about how he has helped so many other people when he does not feel like anyone is helping him now.  He details a story back to when he was living in [place] and how he got to gang rivals together and they had a discussion and stopping getting more.  Patient then states he feels those that he may have PTSD but gives no specific traumatic event except for living in [place].  Patient then states that he is not happy with his medications as far as his ibuprofen.  He states that he only got 1 little pill  last night and he knows that he can take 800 mg a day.  Principal Problem: Adjustment disorder with mixed anxiety and depressed mood Diagnosis: Principal Problem:   Adjustment disorder with mixed anxiety and depressed mood Active Problems:   DM (diabetes mellitus), type 2, uncontrolled ([place])   Hypertension   Diabetic neuropathy ([place])  Total Time spent with patient: 40 minutes  Past Psychiatric History: Patient claims to have no past psychiatric history.  Says he is never seen a therapist psychiatrist or mental health provider.  I mention to him that a note in the chart says that he had been prescribed an antidepressant medicine at one time.  Patient acknowledges that but says he did not take it for long did not follow-up and cannot remember what the name of it was.  He denies ever having tried to hurt himself in the past.  Denies any history of mania.  Denies any history anytime as an adult of abusing alcohol or drugs.  Past Medical History:  Past Medical History:  Diagnosis Date  . Diabetes mellitus without complication ([place])   . Elevated LFTs 05/21/2011  . Hyperlipidemia   . Hypertension    History reviewed. No pertinent surgical history. Family History:  Family History  Problem  Relation Age of Onset  . Diabetes Mother   . Hypertension Father   . Diabetes Father   . Heart disease Father    Family Psychiatric  History: Patient denies any family history saying "I am from a family of strong people" Social History:  Social History   Substance and Sexual Activity  Alcohol Use Yes  . Alcohol/week: 1.0 standard drinks  . Types: 1 Shots of liquor per week   Comment: occasionally, 1 beer a day     Social History   Substance and Sexual Activity  Drug Use Yes  . Types: Marijuana    Social History   Socioeconomic History  . Marital status: Married    Spouse name: Not on file  . Number of children: Not on file  . Years of education: Not on file  . Highest education level:  Not on file  Occupational History  . Not on file  Social Needs  . Financial resource strain: Not on file  . Food insecurity    Worry: Not on file    Inability: Not on file  . Transportation needs    Medical: Not on file    Non-medical: Not on file  Tobacco Use  . Smoking status: Current Every Day Smoker    Packs/day: 2.00    Years: 35.00    Pack years: 70.00    Types: Cigars  . Smokeless tobacco: Current User  . Tobacco comment: Smoking 1-2 small cigars daily  Substance and Sexual Activity  . Alcohol use: Yes    Alcohol/week: 1.0 standard drinks    Types: 1 Shots of liquor per week    Comment: occasionally, 1 beer a day  . Drug use: Yes    Types: Marijuana  . Sexual activity: Yes    Partners: Female    Birth control/protection: Condom  Lifestyle  . Physical activity    Days per week: Not on file    Minutes per session: Not on file  . Stress: Not on file  Relationships  . Social Musician on phone: Not on file    Gets together: Not on file    Attends religious service: Not on file    Active member of club or organization: Not on file    Attends meetings of clubs or organizations: Not on file    Relationship status: Not on file  Other Topics Concern  . Not on file  Social History Narrative  . Not on file   Additional Social History:                         Sleep: Good  Appetite:  Good  Current Medications: Current Facility-Administered Medications  Medication Dose Route Frequency Provider Last Rate Last Dose  . fluticasone (FLONASE) 50 MCG/ACT nasal spray 2 spray  2 spray Each Nare Daily Clapacs, Jackquline Denmark, MD   2 spray at 04/01/19 [phone]  . gabapentin (NEURONTIN) tablet 600 mg  600 mg Oral TID Clapacs, Jackquline Denmark, MD   600 mg at 04/03/19 1138  . hydrOXYzine (ATARAX/VISTARIL) tablet 25 mg  25 mg Oral Q6H PRN Gillermo Murdoch, NP   25 mg at 04/01/19 2048  . ibuprofen (ADVIL) tablet 800 mg  800 mg Oral BID PRN Malvin Johns, MD   800 mg at  04/03/19 0417  . insulin aspart (novoLOG) injection 0-5 Units  0-5 Units Subcutaneous QHS Bela Nyborg, [name], FNP      . insulin aspart (novoLOG)  injection 0-9 Units  0-9 Units Subcutaneous TID WC Jahlon Baines, Gerlene Burdockravis B, FNP   9 Units at 04/03/19 1138  . insulin aspart (novoLOG) injection 5 Units  5 Units Subcutaneous TID WC Doy Taaffe, Gerlene Burdockravis B, FNP   5 Units at 04/03/19 1140  . [START ON 04/04/2019] insulin glargine (LANTUS) injection 15 Units  15 Units Subcutaneous QHS Damany Eastman, Gerlene Burdockravis B, FNP      . [START ON 04/04/2019] insulin glargine (LANTUS) injection 20 Units  20 Units Subcutaneous Daily Laith Antonelli, Feliz Beamravis B, FNP      . loperamide (IMODIUM) capsule 4 mg  4 mg Oral PRN Clapacs, Jackquline DenmarkJohn T, MD   4 mg at 03/28/19 1708  . magnesium hydroxide (MILK OF MAGNESIA) suspension 30 mL  30 mL Oral Daily PRN Gillermo Murdochhompson, Jacqueline, NP      . ondansetron (ZOFRAN) tablet 4 mg  4 mg Oral Q6H PRN Clapacs, John T, MD      . oxymetazoline (AFRIN) 0.05 % nasal spray 1 spray  1 spray Each Nare BID Clapacs, Jackquline DenmarkJohn T, MD   1 spray at 04/03/19 0815  . pantoprazole (PROTONIX) EC tablet 40 mg  40 mg Oral Daily Charlea Nardo, Gerlene Burdockravis B, FNP   40 mg at 04/03/19 0815  . simethicone (MYLICON) chewable tablet 80 mg  80 mg Oral QID PRN Shuntay Everetts, Gerlene Burdockravis B, FNP        Lab Results:  Results for orders placed or performed during the hospital encounter of 03/23/19 (from the past 48 hour(s))  Glucose, capillary     Status: Abnormal   Collection Time: 04/01/19  4:00 PM  Result Value Ref Range   Glucose-Capillary 138 (H) 70 - 99 mg/dL   Comment 1 Notify RN   Glucose, capillary     Status: Abnormal   Collection Time: 04/01/19  8:43 PM  Result Value Ref Range   Glucose-Capillary 323 (H) 70 - 99 mg/dL   Comment 1 Notify RN   Glucose, capillary     Status: Abnormal   Collection Time: 04/02/19  6:58 AM  Result Value Ref Range   Glucose-Capillary 504 (HH) 70 - 99 mg/dL   Comment 1 Notify RN   Glucose, capillary     Status: Abnormal   Collection Time: 04/02/19 11:16  AM  Result Value Ref Range   Glucose-Capillary 323 (H) 70 - 99 mg/dL  Glucose, capillary     Status: Abnormal   Collection Time: 04/02/19 12:33 PM  Result Value Ref Range   Glucose-Capillary 265 (H) 70 - 99 mg/dL  Glucose, capillary     Status: Abnormal   Collection Time: 04/02/19  3:14 PM  Result Value Ref Range   Glucose-Capillary 107 (H) 70 - 99 mg/dL  Glucose, capillary     Status: Abnormal   Collection Time: 04/02/19  4:05 PM  Result Value Ref Range   Glucose-Capillary 179 (H) 70 - 99 mg/dL  Glucose, capillary     Status: Abnormal   Collection Time: 04/02/19  8:53 PM  Result Value Ref Range   Glucose-Capillary 388 (H) 70 - 99 mg/dL  Glucose, capillary     Status: Abnormal   Collection Time: 04/03/19  6:58 AM  Result Value Ref Range   Glucose-Capillary 523 (HH) 70 - 99 mg/dL   Comment 1 Notify RN   Glucose, capillary     Status: Abnormal   Collection Time: 04/03/19 11:32 AM  Result Value Ref Range   Glucose-Capillary 372 (H) 70 - 99 mg/dL    Blood Alcohol level:  No  results found for: [facility]  Metabolic Disorder Labs: Lab Results  Component Value Date   HGBA1C 10.0 (H) 03/18/2019   MPG 240 03/18/2019   No results found for: PROLACTIN Lab Results  Component Value Date   CHOL 189 01/25/2019   TRIG 82 01/25/2019   HDL 71 01/25/2019   CHOLHDL 2.7 01/25/2019   VLDL 11 05/15/2013   LDLCALC 102 (H) 01/25/2019   LDLCALC 80 05/15/2013    Physical Findings: AIMS:  , ,  ,  ,    CIWA:    COWS:     Musculoskeletal: Strength & Muscle Tone: within normal limits Gait & Station: Continues to use walker during ambulation  Patient leans: N/A  Psychiatric Specialty Exam: Physical Exam  Nursing note and vitals reviewed. Constitutional: He is oriented to person, place, and time. He appears well-developed and well-nourished.  Respiratory: Effort normal.  Musculoskeletal: Normal range of motion.  Neurological: He is alert and oriented to person, place, and time.  Skin:  Skin is warm.    Review of Systems  Constitutional: Negative.   HENT: Negative.   Eyes: Negative.   Respiratory: Negative.   Cardiovascular: Negative.   Gastrointestinal: Negative.   Genitourinary: Negative.   Musculoskeletal: Negative.   Skin: Negative.   Neurological: Negative.        Complains of neuropathy  Endo/Heme/Allergies: Negative.   Psychiatric/Behavioral: Positive for depression.    Blood pressure 110/72, pulse (!) 110, temperature 98.1 F (36.7 C), temperature source Oral, resp. rate 17, height  (1.88 m), weight 74.8 kg, SpO2 100 %.Body mass index is 21.17 kg/m.  General Appearance: Casual  Eye Contact:  Good  Speech:  Clear and Coherent and Normal Rate  Volume:  Normal  Mood:  Depressed  Affect:  Congruent  Thought Process:  Coherent and Descriptions of Associations: Intact  Orientation:  Full (Time, Place, and Person)  Thought Content:  WDL  Suicidal Thoughts:  No  Homicidal Thoughts:  No  Memory:  Immediate;   Good Recent;   Good Remote;   Good  Judgement:  Good  Insight:  Good  Psychomotor Activity:  Normal  Concentration:  Concentration: Good  Recall:  Good  Fund of Knowledge:  Good  Language:  Good  Akathisia:  No  Handed:  Right  AIMS (if indicated):     Assets:  Communication Skills Desire for Improvement Resilience  ADL's:  Intact  Cognition:  WNL  Sleep:  Number of Hours: 6   Assessment: Patient presents in the day room and is sitting up in a chair.  Patient has not been attending groups today and has been kind of withdrawn and he states that is due to him being upset about his diet and feeling like he has nowhere to turn.  Patient has frequently complained about multiple things why is been in the hospital, especially his diet due to his diabetes.  After discussing with the patient today I did contact dietary and they stated that there was no problem with doing a double portion of protein as long as there was an order in and there was no  double portions of carbs.  I also reviewed the patient's chart and the patient is already prescribed 800 mg of ibuprofen and this was explained to the patient.  Feel that trying to fix some of the patient's problems, that are able to be fixed here, can benefit the patient's mood.  There will be no other medication changes at this time for his psychiatric medications.  After consultation from the diabetic coordinator patient's insulin orders have been modified.  Treatment Plan Summary: Daily contact with patient to assess and evaluate symptoms and progress in treatment and Medication management  Continue gabapentin 600 mg p.o. 3 times daily for neuropathic pain Continue Vistaril 25 mg p.o. every 6 hours as needed for anxiety Continue Advil 800 mg p.o. twice daily as needed for moderate pain Start NovoLog 0 to 5 units sliding scale nightly for diabetes Continue NovoLog 0 to 9 units 3 times daily with meals for diabetes Start NovoLog 5 units 3 times daily with meals for diabetes Start Lantus 15 units subcu nightly for diabetes Start Lantus 20 units subcu daily for diabetes Continue Protonix 40 mg p.o. daily for GERD Continue simethicone 80 mg 4 times daily as needed for flatulence Encourage group therapy participation CSW to assist with disposition planning Continue every 15 minute safety checks  Maryfrances Bunnell, FNP 04/03/2019, 3:29 PM

## 2019-04-03 NOTE — BHH Counselor (Signed)
CSW attempted to call Aron Baba at [phone].  CSW left HIPAA compliant voicemail.  [name], MSW, LCSW 04/03/2019 11:28 AM

## 2019-04-03 NOTE — Progress Notes (Signed)
Pt was cooperative today however somewhat irritable this am. Pt seemed better this afternoon. Pt attended groups. Pt had some changes regarding his insulin. Collier Bullock RN

## 2019-04-04 LAB — GLUCOSE, CAPILLARY
Glucose-Capillary: 368 mg/dL — ABNORMAL HIGH (ref 70–99)
Glucose-Capillary: 284 mg/dL — ABNORMAL HIGH (ref 70–99)
Glucose-Capillary: 239 mg/dL — ABNORMAL HIGH (ref 70–99)
Glucose-Capillary: 312 mg/dL — ABNORMAL HIGH (ref 70–99)

## 2019-04-04 MED ORDER — FLUOXETINE HCL 20 MG PO CAPS
20.0000 mg | ORAL_CAPSULE | Freq: Every day | ORAL | Status: DC
  Administered 2019-04-04 – 2019-04-07 (×4): 20 mg via ORAL
  Filled 2019-04-04 (×4): qty 1

## 2019-04-04 NOTE — Progress Notes (Signed)
Recreation Therapy Notes   Date: 04/04/2019  Time: 9:30 am  Location: Craft room  Behavioral response: Appropriate   Intervention Topic: Relaxation   Discussion/Intervention:  Group content today was focused on relaxation. The group defined relaxation and identified healthy ways to relax. Individuals expressed how much time they spend relaxing. Patients expressed how much their life would be if they did not make time for themselves to relax. The group stated ways they could improve their relaxation techniques in the future.  Individuals participated in the intervention "Time to Relax" where they had a chance to experience different relaxation techniques.  Clinical Observations/Feedback:  Patient came to group late due to unknown reasons. Individual was social with peers and staff while participating in the intervention.    Lyndal Alamillo LRT/CTRS         Nataya Bastedo 04/04/2019 12:11 PM

## 2019-04-04 NOTE — Progress Notes (Signed)
Physical Therapy Treatment Patient Details Name: Cody Campbell MRN: [id number] DOB: [birth year] Today's Date: 04/04/2019    History of Present Illness Pt is a 53 y/o M admitted for adjustment disorder with mixed anxiety and depressed mood. C/o dizziness, lethargy, and altered sensation 2/2 diabetic neuropathy. PMH includes DMII, HTN, HLD, depression.    PT Comments    Pt is making good progress towards goals and motivated to work with therapy. Expressed frustration with inability to control blood glucose levels and how he feels his depression isn't being addressed. Physically, he demonstrates improved ease of mobility with decreased fall risk. Correct use of RW. Able to perform 5 time sit<>stand in 37 seconds demonstrating decreased strength/power. There-ex limited secondary to dinner ready and pt requesting to eat. Will re-attempt as able later this week.   Follow Up Recommendations  Outpatient PT     Equipment Recommendations  Rolling walker with 5" wheels    Recommendations for Other Services       Precautions / Restrictions Precautions Precautions: None Restrictions Weight Bearing Restrictions: No    Mobility  Bed Mobility               General bed mobility comments: not performed as pt received outside  Transfers Overall transfer level: Needs assistance Equipment used: Rolling walker (2 wheeled) Transfers: Sit to/from Stand Sit to Stand: Supervision         General transfer comment: able to stand from multiple surfaces using RW.  Ambulation/Gait Ambulation/Gait assistance: Supervision Gait Distance (Feet): 120 Feet Assistive device: Rolling walker (2 wheeled) Gait Pattern/deviations: Step-through pattern;Trunk flexed     General Gait Details: ambulation performed with RW and improved upright posture. Improved step length and smooth cadence.    Stairs             Wheelchair Mobility    Modified Rankin (Stroke Patients Only)       Balance  Overall balance assessment: Needs assistance Sitting-balance support: Feet supported Sitting balance-Leahy Scale: Normal     Standing balance support: No upper extremity supported Standing balance-Leahy Scale: Good Standing balance comment: able to perform static standing without RW, however needs B hand support with dynamic                            Cognition Arousal/Alertness: Awake/alert Behavior During Therapy: WFL for tasks assessed/performed Overall Cognitive Status: Within Functional Limits for tasks assessed                                        Exercises Other Exercises Other Exercises: Pt performed 5 time sit<>stand in 37 seconds, standing B LE hip abd x 12 reps    General Comments        Pertinent Vitals/Pain Pain Assessment: No/denies pain    Home Living                      Prior Function            PT Goals (current goals can now be found in the care plan section) Acute Rehab PT Goals Patient Stated Goal: to get stronger PT Goal Formulation: With patient Time For Goal Achievement: 04/13/19 Potential to Achieve Goals: Good Progress towards PT goals: Progressing toward goals    Frequency           PT Plan Current plan  remains appropriate    Co-evaluation              AM-PAC PT "6 Clicks" Mobility   Outcome Measure  Help needed turning from your back to your side while in a flat bed without using bedrails?: None Help needed moving from lying on your back to sitting on the side of a flat bed without using bedrails?: None Help needed moving to and from a bed to a chair (including a wheelchair)?: None Help needed standing up from a chair using your arms (e.g., wheelchair or bedside chair)?: None Help needed to walk in hospital room?: A Little Help needed climbing 3-5 steps with a railing? : A Little 6 Click Score: 22    End of Session   Activity Tolerance: Patient tolerated treatment well Patient  left: (with aide to check BS) Nurse Communication: Mobility status PT Visit Diagnosis: Unsteadiness on feet (R26.81);Muscle weakness (generalized) (M62.81);Pain     Time: 1505-6979 PT Time Calculation (min) (ACUTE ONLY): 8 min  Charges:  $Gait Training: 8-22 mins                     Greggory Stallion, [place], DPT [phone]    Graciano Batson 04/04/2019, 4:31 PM

## 2019-04-04 NOTE — Progress Notes (Signed)
[facility] MD Progress Note  04/04/2019 9:10 AM Cody Campbell  MRN:  [id number] Subjective:    Patient is seen once in morning rounds and then knocks on the door and asks for a second interview.  Patient is cautioned against drinking sugar containing drinks to deliberately run up his blood sugar and he becomes irritable and argumentative stating he is not doing that although documentation contradicts this.  He also becomes irritable and argumentative stating "just throw me out on the street then" states he has been here for "days and nobody is addressing my depression" he is asking for an antidepressant. On morning rounds and in the second interview the patient's interaction is basically just a litany of complaints against staff, clinicians, the fact no one is helping him so forth and again he is confronted with his direct manipulation and becomes more irritable and argumentative as resolved and lacks the insight to benefit from this type of confrontation at this point in time. But he does deny suicidal thoughts homicidal thoughts he denies hallucinations he has no withdrawal symptoms  Principal Problem: Adjustment disorder with mixed anxiety and depressed mood Diagnosis: Principal Problem:   Adjustment disorder with mixed anxiety and depressed mood Active Problems:   DM (diabetes mellitus), type 2, uncontrolled (HCC)   Hypertension   Diabetic neuropathy (HCC)  Total Time spent with patient: 30 minutes  Past Psychiatric History: See HPI  Past Medical History:  Past Medical History:  Diagnosis Date  . Diabetes mellitus without complication (HCC)   . Elevated LFTs 05/21/2011  . Hyperlipidemia   . Hypertension    History reviewed. No pertinent surgical history. Family History:  Family History  Problem Relation Age of Onset  . Diabetes Mother   . Hypertension Father   . Diabetes Father   . Heart disease Father    Family Psychiatric  History: See HPI Social History:  Social History    Substance and Sexual Activity  Alcohol Use Yes  . Alcohol/week: 1.0 standard drinks  . Types: 1 Shots of liquor per week   Comment: occasionally, 1 beer a day     Social History   Substance and Sexual Activity  Drug Use Yes  . Types: Marijuana    Social History   Socioeconomic History  . Marital status: Married    Spouse name: Not on file  . Number of children: Not on file  . Years of education: Not on file  . Highest education level: Not on file  Occupational History  . Not on file  Social Needs  . Financial resource strain: Not on file  . Food insecurity    Worry: Not on file    Inability: Not on file  . Transportation needs    Medical: Not on file    Non-medical: Not on file  Tobacco Use  . Smoking status: Current Every Day Smoker    Packs/day: 2.00    Years: 35.00    Pack years: 70.00    Types: Cigars  . Smokeless tobacco: Current User  . Tobacco comment: Smoking 1-2 small cigars daily  Substance and Sexual Activity  . Alcohol use: Yes    Alcohol/week: 1.0 standard drinks    Types: 1 Shots of liquor per week    Comment: occasionally, 1 beer a day  . Drug use: Yes    Types: Marijuana  . Sexual activity: Yes    Partners: Female    Birth control/protection: Condom  Lifestyle  . Physical activity    Days  per week: Not on file    Minutes per session: Not on file  . Stress: Not on file  Relationships  . Social Herbalist on phone: Not on file    Gets together: Not on file    Attends religious service: Not on file    Active member of club or organization: Not on file    Attends meetings of clubs or organizations: Not on file    Relationship status: Not on file  Other Topics Concern  . Not on file  Social History Narrative  . Not on file    Sleep: Fair  Appetite:  Fair  Current Medications: Current Facility-Administered Medications  Medication Dose Route Frequency Provider Last Rate Last Dose  . FLUoxetine (PROZAC) capsule 20 mg  20  mg Oral Daily Johnn Hai, MD      . fluticasone [facility]) 50 MCG/ACT nasal spray 2 spray  2 spray Each Nare Daily Clapacs, Madie Reno, MD   2 spray at 04/01/19 [phone]  . gabapentin (NEURONTIN) tablet 600 mg  600 mg Oral TID Clapacs, Madie Reno, MD   600 mg at 04/04/19 0748  . hydrOXYzine (ATARAX/VISTARIL) tablet 25 mg  25 mg Oral Q6H PRN Caroline Sauger, NP   25 mg at 04/01/19 2048  . ibuprofen (ADVIL) tablet 800 mg  800 mg Oral BID PRN Johnn Hai, MD   800 mg at 04/04/19 0010  . insulin aspart (novoLOG) injection 0-5 Units  0-5 Units Subcutaneous QHS Money, Lowry Ram, FNP      . insulin aspart (novoLOG) injection 0-9 Units  0-9 Units Subcutaneous TID WC Money, Lowry Ram, FNP   9 Units at 04/04/19 0719  . insulin aspart (novoLOG) injection 5 Units  5 Units Subcutaneous TID WC Money, Lowry Ram, FNP   5 Units at 04/04/19 0719  . insulin glargine (LANTUS) injection 15 Units  15 Units Subcutaneous QHS Money, Lowry Ram, FNP      . insulin glargine (LANTUS) injection 20 Units  20 Units Subcutaneous Daily Money, Darnelle Maffucci B, FNP      . loperamide (IMODIUM) capsule 4 mg  4 mg Oral PRN Clapacs, Madie Reno, MD   4 mg at 03/28/19 1708  . magnesium hydroxide (MILK OF MAGNESIA) suspension 30 mL  30 mL Oral Daily PRN Caroline Sauger, NP      . ondansetron (ZOFRAN) tablet 4 mg  4 mg Oral Q6H PRN Clapacs, John T, MD      . oxymetazoline (AFRIN) 0.05 % nasal spray 1 spray  1 spray Each Nare BID Clapacs, Madie Reno, MD   1 spray at 04/04/19 0748  . pantoprazole (PROTONIX) EC tablet 40 mg  40 mg Oral Daily Money, Lowry Ram, FNP   40 mg at 04/04/19 0748  . simethicone (MYLICON) chewable tablet 80 mg  80 mg Oral QID PRN Money, Lowry Ram, FNP        Lab Results:  Results for orders placed or performed during the hospital encounter of 03/23/19 (from the past 48 hour(s))  Glucose, capillary     Status: Abnormal   Collection Time: 04/02/19 11:16 AM  Result Value Ref Range   Glucose-Capillary 323 (H) 70 - 99 mg/dL  Glucose,  capillary     Status: Abnormal   Collection Time: 04/02/19 12:33 PM  Result Value Ref Range   Glucose-Capillary 265 (H) 70 - 99 mg/dL  Glucose, capillary     Status: Abnormal   Collection Time: 04/02/19  3:14 PM  Result Value  Ref Range   Glucose-Capillary 107 (H) 70 - 99 mg/dL  Glucose, capillary     Status: Abnormal   Collection Time: 04/02/19  4:05 PM  Result Value Ref Range   Glucose-Capillary 179 (H) 70 - 99 mg/dL  Glucose, capillary     Status: Abnormal   Collection Time: 04/02/19  8:53 PM  Result Value Ref Range   Glucose-Capillary 388 (H) 70 - 99 mg/dL  Glucose, capillary     Status: Abnormal   Collection Time: 04/03/19  6:58 AM  Result Value Ref Range   Glucose-Capillary 523 (HH) 70 - 99 mg/dL   Comment 1 Notify RN   Glucose, capillary     Status: Abnormal   Collection Time: 04/03/19 11:32 AM  Result Value Ref Range   Glucose-Capillary 372 (H) 70 - 99 mg/dL  Glucose, capillary     Status: Abnormal   Collection Time: 04/03/19  4:32 PM  Result Value Ref Range   Glucose-Capillary 258 (H) 70 - 99 mg/dL  Glucose, capillary     Status: Abnormal   Collection Time: 04/03/19  8:22 PM  Result Value Ref Range   Glucose-Capillary 137 (H) 70 - 99 mg/dL  Glucose, capillary     Status: Abnormal   Collection Time: 04/03/19  8:34 PM  Result Value Ref Range   Glucose-Capillary 163 (H) 70 - 99 mg/dL  Glucose, capillary     Status: Abnormal   Collection Time: 04/04/19  7:01 AM  Result Value Ref Range   Glucose-Capillary 368 (H) 70 - 99 mg/dL    Blood Alcohol level:  No results found for: [facility]  Metabolic Disorder Labs: Lab Results  Component Value Date   HGBA1C 10.0 (H) 03/18/2019   MPG 240 03/18/2019   No results found for: PROLACTIN Lab Results  Component Value Date   CHOL 189 01/25/2019   TRIG 82 01/25/2019   HDL 71 01/25/2019   CHOLHDL 2.7 01/25/2019   VLDL 11 05/15/2013   LDLCALC 102 (H) 01/25/2019   LDLCALC 80 05/15/2013    Physical Findings: AIMS:  , ,  ,  ,     CIWA:    COWS:     Musculoskeletal: Strength & Muscle Tone: within normal limits Gait & Station: normal Patient leans: Using walker  Psychiatric Specialty Exam: Physical Exam  ROS  Blood pressure 138/82, pulse 99, temperature (!) 97.5 F (36.4 C), temperature source Oral, resp. rate 18, height  (1.88 m), weight 74.8 kg, SpO2 100 %.Body mass index is 21.17 kg/m.  General Appearance: Casual  Eye Contact:  Fair  Speech:  Pressured  Volume:  Increased  Mood:  Angry and Irritable  Affect:  Labile  Thought Process:  Goal Directed and Descriptions of Associations: Intact  Orientation:  Full (Time, Place, and Person)  Thought Content:  Denies auditory or visual hallucinations denies thoughts of harming self or others  Suicidal Thoughts:  No  Homicidal Thoughts:  No  Memory:  Immediate;   Fair Recent;   Fair Remote;   Fair  Judgement:  Fair  Insight:  Shallow  Psychomotor Activity:  Normal  Concentration:  Concentration: Fair and Attention Span: Fair  Recall:  [employer] of Knowledge:  Fair  Language:  Fair  Akathisia:  Negative  Handed:  Right  AIMS (if indicated):     Assets:  Physical Health Resilience  ADL's:  Intact  Cognition:  WNL  Sleep:  Number of Hours: 6   Treatment Plan Summary: Daily contact with patient to  assess and evaluate symptoms and progress in treatment and Medication management  Patient directly confronted with his manipulative behavior and unfortunately becomes more argumentative and irritable rather than more insightful but we will continue to engage him in reality based therapy.  Because he specifically requesting an antidepressant we will begin fluoxetine because that may help with his irritability.  Probable discharge this week again patient remains manipulative stating "just throw me out on the street" but this is not a meaningful plan and discharge.  Malvin JohnsFARAH,Cody Focht, MD 04/04/2019, 9:10 AM

## 2019-04-04 NOTE — Plan of Care (Signed)
Patient knowledgeable of information   received  regarding unit programing  able  to verbalize understanding . Patient attending unit  therapy groups . Mental and emotional status improving. Voice no concerns around sleep. No anger outburst noted  this  shift  able to control behavior . Continue to walk with a walker  Patient wearing Ted hose      Problem: Education: Goal: Knowledge of [facility] Education information/materials will improve Outcome: Progressing Goal: Emotional status will improve Outcome: Progressing Goal: Mental status will improve Outcome: Progressing Goal: Verbalization of understanding the information provided will improve Outcome: Progressing   Problem: Activity: Goal: Interest or engagement in activities will improve Outcome: Progressing Goal: Sleeping patterns will improve Outcome: Progressing   Problem: Coping: Goal: Ability to verbalize frustrations and anger appropriately will improve Outcome: Progressing Goal: Ability to demonstrate self-control will improve Outcome: Progressing   Problem: Physical Regulation: Goal: Ability to maintain clinical measurements within normal limits will improve Outcome: Progressing   Problem: Safety: Goal: Periods of time without injury will increase Outcome: Progressing

## 2019-04-04 NOTE — BHH Group Notes (Signed)
LCSW Group Therapy Note  04/04/2019 1:00 PM  Type of Therapy/Topic:  Group Therapy:  Feelings about Diagnosis  Participation Level:  Active   Description of Group:   This group will allow patients to explore their thoughts and feelings about diagnoses they have received. Patients will be guided to explore their level of understanding and acceptance of these diagnoses. Facilitator will encourage patients to process their thoughts and feelings about the reactions of others to their diagnosis and will guide patients in identifying ways to discuss their diagnosis with significant others in their lives. This group will be process-oriented, with patients participating in exploration of their own experiences, giving and receiving support, and processing challenge from other group members.   Therapeutic Goals: 1. Patient will demonstrate understanding of diagnosis as evidenced by identifying two or more symptoms of the disorder 2. Patient will be able to express two feelings regarding the diagnosis 3. Patient will demonstrate their ability to communicate their needs through discussion and/or role play  Summary of Patient Progress: Patient was present in group.  Patient was an active participant and engaged in group discussion. Patient shared that he has never had a mental health diagnosis and he is overwhelmed with feeling current diagnosis.    Therapeutic Modalities:   Cognitive Behavioral Therapy Brief Therapy Feelings Identification   [name], MSW, LCSW 04/04/2019 2:11 PM

## 2019-04-04 NOTE — BHH Counselor (Signed)
CSW spoke with the patient about discussion with his son.  CSW informed that the patient's son doesn't have the contact information but will reach out family members on social media.  Patient reports that he has started reaching out family members and a cousin has told him that he will let him know by the end of the week.  [name], MSW, LCSW 04/04/2019 2:28 PM

## 2019-04-04 NOTE — BHH Counselor (Signed)
CSW spoke with patient regarding a discussion patient had with weekend CSW to reach out to family for a potential home.    Patient was very disgruntled an upset.  Patient reports frustration with treatment from staff.  Patient reports that he is depressed and no one believes him.   Patient reports that he has not spoken or followed up with anyone as encourage.  Patient reports that he does not have family members numbers.  Patient asked CSW to reach out to his son to find the numbers for a cousin Rudene Re and for the patient's mother and sister in [place].  CSW agreed to contact son, however, encouraged the pt to do so as well.  CSW did reach out to the patient's son who reports that the patient "has burned that bridge a long time ago" in regards to [place].  He reports "he had an opportunity to go there last year and didn't take it".  He reports that he does not have the contact information for [place] or Chrissie Noa but will reach out on Facebook and follow up with this CSW when/if he gets numbers.  [name], MSW, LCSW 04/04/2019 9:45 AM

## 2019-04-04 NOTE — BHH Counselor (Signed)
CSW spoke with [place], Adelene Amas, [phone].  CSW briefed Larrron on the pt.  Larron confirmed that they do no help with housing. He identified that this sounds to be the primary concern for the patient at this time.   Maree Krabbe reports that housing isn't an appropriate predicament to maximize a hospital stay if medically cleared.  He rpeorts that patient's are not able to stay indefinitely.  He recommended that patient's only options are a shelter or family/friend to offer support.  He also encouraged that patient be linked with community resources.  CSW reported that this conversation has already been started with the patient.  [name], MSW, LCSW 04/04/2019 3:05 PM

## 2019-04-04 NOTE — Progress Notes (Signed)
Patient alert and oriented x 4, affect is flat but he brightens upon approach, he denies SI/HI/AVH no distress noted, interacting appropriately with peers and staff, he was noted in the dayroom socializing with peers, attended evening wrap up group, no distress noted, 15 minutes safety checks  maintained will continue to monitor

## 2019-04-04 NOTE — Progress Notes (Signed)
Inpatient Diabetes Program Recommendations  AACE/ADA: New Consensus Statement on Inpatient Glycemic Control   Target Ranges:  Prepandial:   less than 140 mg/dL      Peak postprandial:   less than 180 mg/dL (1-2 hours)      Critically ill patients:  140 - 180 mg/dL   Results for JOSSUE, RUBENSTEIN (MRN [id number]) as of 04/04/2019 08:08  Ref. Range 04/03/2019 06:58 04/03/2019 11:32 04/03/2019 16:32 04/03/2019 20:22 04/03/2019 20:34 04/04/2019 07:01  Glucose-Capillary Latest Ref Range: 70 - 99 mg/dL 523 (HH)  Novolog 12 units 372 (H)  Novolog 14 units  Lantus 35 units 258 (H)  Novolog 10 units 137 (H) 163 (H) 368 (H)  Novolog 14 units   Review of Glycemic Control  History:DM, Depression, Homelessness   Home DM Meds: Lantus 35units Daily Novolog 6 units TID   Current Orders: Lantus 20 units QAM, Lantus 15 units QHS, Novolog0-9 units TID with meals,Novolog 0-5 units QHS, Novolog 5 units TID with meals  PCP: [facility] (gets Insulin at [facility])  Inpatient Diabetes Program Recommendations:  Insulin-Basal: Lantus split into 2 doses as patient requested to start today. Patient received Lantus 35 units at 11:42 on 04/03/19. Please consider increasing bedtime dose of Lantus to 20 units QHS (continue Lantus 20 units QAM).  Thanks, Barnie Alderman, RN, MSN, CDE Diabetes Coordinator Inpatient Diabetes Program [phone] (Team Pager from 8am to 5pm)

## 2019-04-04 NOTE — Progress Notes (Signed)
D: Adjustment Disorder/ Anxiety  A: Patient stated slept poor last night .Stated appetite  good and energy level  [place]. Stated concentration poor . Stated on Depression scale 8 , hopeless 5 and anxiety 0 .( low 0-10 high) Denies suicidal  homicidal ideations.  No auditory hallucinations  No pain concerns . Appropriate ADL'S. Interacting with peers and staff.Encourage patient participation with unit programming . Instruction  Given on  Medication , verbalize understanding. Continue to  Walk with walker, gait noted to  Have improved. Patient worked  With physical therapy this shift .  Patient  Allowed access  To phone  For arrangements for follow up placement . Angry upset  Confronted by MD on his  Eating .   R: Voice no other concerns. Staff continue to monitor

## 2019-04-05 LAB — GLUCOSE, CAPILLARY
Glucose-Capillary: 208 mg/dL — ABNORMAL HIGH (ref 70–99)
Glucose-Capillary: 187 mg/dL — ABNORMAL HIGH (ref 70–99)
Glucose-Capillary: 192 mg/dL — ABNORMAL HIGH (ref 70–99)
Glucose-Capillary: 118 mg/dL — ABNORMAL HIGH (ref 70–99)
Glucose-Capillary: 195 mg/dL — ABNORMAL HIGH (ref 70–99)

## 2019-04-05 MED ORDER — TRAZODONE HCL 50 MG PO TABS
50.0000 mg | ORAL_TABLET | Freq: Every evening | ORAL | Status: DC | PRN
  Administered 2019-04-06: 21:00:00 50 mg via ORAL
  Filled 2019-04-05: qty 1

## 2019-04-05 NOTE — Progress Notes (Signed)
D - Patient was in the day room upon arrival. Patient was pleasant during assessment denying SI/HI/AVH, pain, anxiety and depression. Patient was educated today on a carb modified diet and ate carb modified snack this evening. Patient presents depressed stating, "They are throwing me out on my ass Friday." Patient given education, support and encouragement.   A - Patient compliant with medication administration per MD orders. Patient given education. Patient given support and encouragement to be active in his treatment plan. Patient informed to let staff know if there are any issues or problems on the unit.   R - Patient being monitored Q 15 minutes for safety per unit protocol. Patient remains safe on the unit.

## 2019-04-05 NOTE — Plan of Care (Signed)
Patient presents in a sad/depressed mood about leaving the hospital this week  Problem: Education: Goal: Emotional status will improve Outcome: Not Progressing Goal: Mental status will improve Outcome: Not Progressing

## 2019-04-05 NOTE — BHH Counselor (Signed)
CSW team met with pt to inform him he is scheduled for discharge on 11/6 and to discuss his discharge plan. Pt states he needs to go to his storage building in [place] to get his belongings and then would like to go to [place] to live with a cousin. When asked if he can live with his cousin, pt says he will have a decision by Friday(11/6). CSW team asked pt what an alternative plan may be if his cousin is not agreeable to him coming to his home and the pt became upset and avoided answering the question. CSW team tried to assist him with brainstorming other housing options but the pt became upset. CSW team encouraged the pt to contact his cousin to confirm whether or not he can live with him. CSW team informed the pt, transportation cannot be provided to get him to [place] to his storage unit and [place] instead he would need to choose one or the other and contact anyone in his support system to assist him with getting to his destination. CSW team informed pt they will check in with him on tomorrow to further discuss his discharge plan.

## 2019-04-05 NOTE — Plan of Care (Signed)
Patient knowledgeable of information   received  regarding unit programing  able  to verbalize understanding . Patient attending unit  therapy groups . Mental and emotional status improving. Voice no concerns around sleep. No anger outburst noted  this  shift  able to control behavior . Continue to walk with a walker  Patient wearing Ted hose      Problem: Education: Goal: Knowledge of [facility] Education information/materials will improve Outcome: Progressing Goal: Emotional status will improve Outcome: Progressing Goal: Mental status will improve Outcome: Progressing Goal: Verbalization of understanding the information provided will improve Outcome: Progressing   Problem: Activity: Goal: Interest or engagement in activities will improve Outcome: Progressing Goal: Sleeping patterns will improve Outcome: Progressing   Problem: Coping: Goal: Ability to verbalize frustrations and anger appropriately will improve Outcome: Progressing Goal: Ability to demonstrate self-control will improve Outcome: Progressing   Problem: Health Behavior/Discharge Planning: Goal: Identification of resources available to assist in meeting health care needs will improve Outcome: Progressing Goal: Compliance with treatment plan for underlying cause of condition will improve Outcome: Progressing   Problem: Physical Regulation: Goal: Ability to maintain clinical measurements within normal limits will improve Outcome: Progressing   Problem: Safety: Goal: Periods of time without injury will increase Outcome: Progressing

## 2019-04-05 NOTE — Progress Notes (Signed)
D:   Depression  / Placement   A: Patient stated slept poor  last night . Patient only came out for meals , return back to bed . Stated appetite  good and energy level  Iow. Stated concentration is fair. Stated  Depression  Denies suicidal  homicidal ideations  . Patient informed of  Possible  Discharge on Friday . Upset about processs. Stated he wanted to go to [employer]. To a cousin  Home .  Patient has no money or means of getting there. Patient reports no    No auditory hallucinations  No pain concerns . Appropriate ADL'S. Limited Interacting with peers and staff. Patient knowledgeable of information received regarding unit programing able to verbalize understanding . Patient attending unit therapy groups . Mental and emotional status improving. Voice no concerns around sleep. No anger outburst noted  this  shift able to control behavior . Continue to walk with a walkerPatient wearing Ted hose. Encourage patient participation with unit programming . Instruction  Given on  Medication , verbalize understanding.  R: Voice no other concerns. Staff continue to monitor

## 2019-04-05 NOTE — Progress Notes (Signed)
Patient alert and oriented x 4, affect is bright upon approach, he denies SI/HI/AVH no distress noted, interacting appropriately with peers and staff, he was noted in the dayroom interacting  with peers, attended evening wrap up group, receptive to staff, no loud outburst  no distress noted, 15 minutes safety checks maintained will continue to monitor

## 2019-04-05 NOTE — [facility] Group Notes (Signed)
LCSW Group Therapy Note  04/05/2019 1:00 PM  Type of Therapy/Topic:  Group Therapy:  Balance in Life  Participation Level:  Did Not Attend  Description of Group:    This group will address the concept of balance and how it feels and looks when one is unbalanced. Patients will be encouraged to process areas in their lives that are out of balance and identify reasons for remaining unbalanced. Facilitators will guide patients in utilizing problem-solving interventions to address and correct the stressor making their life unbalanced. Understanding and applying boundaries will be explored and addressed for obtaining and maintaining a balanced life. Patients will be encouraged to explore ways to assertively make their unbalanced needs known to significant others in their lives, using other group members and facilitator for support and feedback.  Therapeutic Goals: 1. Patient will identify two or more emotions or situations they have that consume much of in their lives. 2. Patient will identify signs/triggers that life has become out of balance:  3. Patient will identify two ways to set boundaries in order to achieve balance in their lives:  4. Patient will demonstrate ability to communicate their needs through discussion and/or role plays  Summary of Patient Progress: X     Therapeutic Modalities:   Cognitive Behavioral Therapy Solution-Focused Therapy Assertiveness Training  [name] MSW, LCSW 04/05/2019 2:46 PM

## 2019-04-05 NOTE — Progress Notes (Signed)
Recreation Therapy Notes    Date: 04/05/2019  Time: 9:30 am   Location: Craft room   Behavioral response: N/A   Intervention Topic: Stress  Discussion/Intervention: Patient did not attend group.   Clinical Observations/Feedback:  Patient did not attend group.   Zaden Sako LRT/CTRS        Madelena Maturin 04/05/2019 11:49 AM

## 2019-04-05 NOTE — Progress Notes (Signed)
[facility]  04/05/2019 1:59 PM Cody Campbell  MRN:  [id number]   Subjective: This is a follow-up on the patient diagnosed with adjustment disorder with mixed anxiety and depressed mood.  Patient reports that he is doing okay today.  He denies any suicidal homicidal ideations and denies any hallucinations.  Patient reports that he has been sleeping during the day because he has not been sleeping well at night.  He states he does not know why he just did not sleep very well so has been tired during the day.  Patient reports that he has been eating okay still.  He also reports that he has contacted a cousin and will find out by Friday if he can go there to live with him or not.  Principal Problem: Adjustment disorder with mixed anxiety and depressed mood Diagnosis: Principal Problem:   Adjustment disorder with mixed anxiety and depressed mood Active Problems:   DM (diabetes mellitus), type 2, uncontrolled (HCC)   Hypertension   Diabetic neuropathy (HCC)  Total Time spent with patient: 30 minutes  Past Psychiatric History: Patient claims to have no past psychiatric history.  Says he is never seen a therapist psychiatrist or mental health provider.  I mention to him that a note in the chart says that he had been prescribed an antidepressant medicine at one time.  Patient acknowledges that but says he did not take it for long did not follow-up and cannot remember what the name of it was.  He denies ever having tried to hurt himself in the past.  Denies any history of mania.  Denies any history anytime as an adult of abusing alcohol or drugs.  Past Medical History:  Past Medical History:  Diagnosis Date  . Diabetes mellitus without complication (HCC)   . Elevated LFTs 05/21/2011  . Hyperlipidemia   . Hypertension    History reviewed. No pertinent surgical history. Family History:  Family History  Problem Relation Age of Onset  . Diabetes Mother   . Hypertension Father   . Diabetes  Father   . Heart disease Father    Family Psychiatric  History: Patient denies any family history saying "I am from a family of strong people" Social History:  Social History   Substance and Sexual Activity  Alcohol Use Yes  . Alcohol/week: 1.0 standard drinks  . Types: 1 Shots of liquor per week   Comment: occasionally, 1 beer a day     Social History   Substance and Sexual Activity  Drug Use Yes  . Types: Marijuana    Social History   Socioeconomic History  . Marital status: Married    Spouse name: Not on file  . Number of children: Not on file  . Years of education: Not on file  . Highest education level: Not on file  Occupational History  . Not on file  Social Needs  . Financial resource strain: Not on file  . Food insecurity    Worry: Not on file    Inability: Not on file  . Transportation needs    Medical: Not on file    Non-medical: Not on file  Tobacco Use  . Smoking status: Current Every Day Smoker    Packs/day: 2.00    Years: 35.00    Pack years: 70.00    Types: Cigars  . Smokeless tobacco: Current User  . Tobacco comment: Smoking 1-2 small cigars daily  Substance and Sexual Activity  . Alcohol use: Yes  Alcohol/week: 1.0 standard drinks    Types: 1 Shots of liquor per week    Comment: occasionally, 1 beer a day  . Drug use: Yes    Types: Marijuana  . Sexual activity: Yes    Partners: Female    Birth control/protection: Condom  Lifestyle  . Physical activity    Days per week: Not on file    Minutes per session: Not on file  . Stress: Not on file  Relationships  . Social Musician on phone: Not on file    Gets together: Not on file    Attends religious service: Not on file    Active member of club or organization: Not on file    Attends meetings of clubs or organizations: Not on file    Relationship status: Not on file  Other Topics Concern  . Not on file  Social History Narrative  . Not on file   Additional Social  History:                         Sleep: Sleep has been good but has been sleeping during the day  Appetite:  Good  Current Medications: Current Facility-Administered Medications  Medication Dose Route Frequency Provider Last Rate Last Dose  . FLUoxetine (PROZAC) capsule 20 mg  20 mg Oral Daily Malvin Johns, MD   20 mg at 04/05/19 0748  . fluticasone (FLONASE) 50 MCG/ACT nasal spray 2 spray  2 spray Each Nare Daily Clapacs, Jackquline Denmark, MD   2 spray at 04/01/19 [phone]  . gabapentin (NEURONTIN) tablet 600 mg  600 mg Oral TID Clapacs, John T, MD   600 mg at 04/05/19 1220  . hydrOXYzine (ATARAX/VISTARIL) tablet 25 mg  25 mg Oral Q6H PRN Gillermo Murdoch, NP   25 mg at 04/01/19 2048  . ibuprofen (ADVIL) tablet 800 mg  800 mg Oral BID PRN Malvin Johns, MD   800 mg at 04/04/19 0010  . insulin aspart (novoLOG) injection 0-5 Units  0-5 Units Subcutaneous QHS [name], [name], FNP   4 Units at 04/04/19 2212  . insulin aspart (novoLOG) injection 0-9 Units  0-9 Units Subcutaneous TID WC [name], [name], FNP   2 Units at 04/05/19 1220  . insulin aspart (novoLOG) injection 5 Units  5 Units Subcutaneous TID WC [name], [name], FNP   5 Units at 04/05/19 1221  . insulin glargine (LANTUS) injection 15 Units  15 Units Subcutaneous QHS [name], [name], FNP   15 Units at 04/04/19 2211  . insulin glargine (LANTUS) injection 20 Units  20 Units Subcutaneous Daily [name], [name], FNP   20 Units at 04/05/19 1024  . loperamide (IMODIUM) capsule 4 mg  4 mg Oral PRN Clapacs, Jackquline Denmark, MD   4 mg at 03/28/19 1708  . magnesium hydroxide (MILK OF MAGNESIA) suspension 30 mL  30 mL Oral Daily PRN Gillermo Murdoch, NP      . ondansetron (ZOFRAN) tablet 4 mg  4 mg Oral Q6H PRN Clapacs, John T, MD      . oxymetazoline (AFRIN) 0.05 % nasal spray 1 spray  1 spray Each Nare BID Clapacs, Jackquline Denmark, MD   1 spray at 04/05/19 0748  . pantoprazole (PROTONIX) EC tablet 40 mg  40 mg Oral Daily Darvin Dials, [name], FNP   40 mg at 04/05/19  0747  . simethicone (MYLICON) chewable tablet 80 mg  80 mg Oral QID PRN Japleen Tornow, [name], FNP      .  traZODone (DESYREL) tablet 50 mg  50 mg Oral QHS PRN Arlicia Paquette, [name], FNP        Lab Results:  Results for orders placed or performed during the hospital encounter of 03/23/19 (from the past 48 hour(s))  Glucose, capillary     Status: Abnormal   Collection Time: 04/03/19  4:32 PM  Result Value Ref Range   Glucose-Capillary 258 (H) 70 - 99 mg/dL  Glucose, capillary     Status: Abnormal   Collection Time: 04/03/19  8:22 PM  Result Value Ref Range   Glucose-Capillary 137 (H) 70 - 99 mg/dL  Glucose, capillary     Status: Abnormal   Collection Time: 04/03/19  8:34 PM  Result Value Ref Range   Glucose-Capillary 163 (H) 70 - 99 mg/dL  Glucose, capillary     Status: Abnormal   Collection Time: 04/04/19  7:01 AM  Result Value Ref Range   Glucose-Capillary 368 (H) 70 - 99 mg/dL  Glucose, capillary     Status: Abnormal   Collection Time: 04/04/19 11:09 AM  Result Value Ref Range   Glucose-Capillary 284 (H) 70 - 99 mg/dL   Comment 1 Notify RN   Glucose, capillary     Status: Abnormal   Collection Time: 04/04/19  4:09 PM  Result Value Ref Range   Glucose-Capillary 239 (H) 70 - 99 mg/dL   Comment 1 Notify RN   Glucose, capillary     Status: Abnormal   Collection Time: 04/04/19  8:53 PM  Result Value Ref Range   Glucose-Capillary 312 (H) 70 - 99 mg/dL  Glucose, capillary     Status: Abnormal   Collection Time: 04/05/19  6:59 AM  Result Value Ref Range   Glucose-Capillary 208 (H) 70 - 99 mg/dL  Glucose, capillary     Status: Abnormal   Collection Time: 04/05/19 10:22 AM  Result Value Ref Range   Glucose-Capillary 187 (H) 70 - 99 mg/dL  Glucose, capillary     Status: Abnormal   Collection Time: 04/05/19 11:23 AM  Result Value Ref Range   Glucose-Capillary 192 (H) 70 - 99 mg/dL   Comment 1 Notify RN     Blood Alcohol level:  No results found for: [facility]  Metabolic Disorder Labs: Lab  Results  Component Value Date   HGBA1C 10.0 (H) 03/18/2019   MPG 240 03/18/2019   No results found for: PROLACTIN Lab Results  Component Value Date   CHOL 189 01/25/2019   TRIG 82 01/25/2019   HDL 71 01/25/2019   CHOLHDL 2.7 01/25/2019   VLDL 11 05/15/2013   LDLCALC 102 (H) 01/25/2019   LDLCALC 80 05/15/2013    Physical Findings: AIMS:  , ,  ,  ,    CIWA:    COWS:     Musculoskeletal: Strength & Muscle Tone: within normal limits Gait & Station: normal Patient leans: N/A  Psychiatric Specialty Exam: Physical Exam  Nursing note and vitals reviewed. Constitutional: He is oriented to person, place, and time. He appears well-developed and well-nourished.  Cardiovascular: Normal rate.  Respiratory: Effort normal.  Musculoskeletal: Normal range of motion.  Neurological: He is alert and oriented to person, place, and time.  Skin: Skin is warm.    Review of Systems  Constitutional: Negative.   HENT: Negative.   Eyes: Negative.   Respiratory: Negative.   Cardiovascular: Negative.   Gastrointestinal: Negative.   Genitourinary: Negative.   Musculoskeletal: Negative.   Skin: Negative.   Neurological: Negative.   Endo/Heme/Allergies: Negative.  Psychiatric/Behavioral: Negative.     Blood pressure (!) 144/85, pulse 100, temperature 98.5 F (36.9 C), temperature source Oral, resp. rate 17, height 6\' 2"  (1.88 m), weight 74.8 kg, SpO2 100 %.Body mass index is 21.17 kg/m.  General Appearance: Casual  Eye Contact:  Fair  Speech:  Clear and Coherent and Normal Rate  Volume:  Normal  Mood:  Euthymic  Affect:  Flat  Thought Process:  Coherent and Descriptions of Associations: Intact  Orientation:  Full (Time, Place, and Person)  Thought Content:  WDL  Suicidal Thoughts:  No  Homicidal Thoughts:  No  Memory:  Immediate;   Good Recent;   Good Remote;   Good  Judgement:  Good  Insight:  Good  Psychomotor Activity:  Normal  Concentration:  Concentration: Good  Recall:   Good  Fund of Knowledge:  Good  Language:  Good  Akathisia:  No  Handed:  Right  AIMS (if indicated):     Assets:  Communication Skills Desire for Improvement Resilience  ADL's:  Intact  Cognition:  WNL  Sleep:  Number of Hours: 5.15   Assessment: Patient presents in his room on his mittens and is asleep.  Patient awakens after saying his name twice.  Patient is calm and cooperative during the interview.  After chart reviewed it appears that the patient has documented 5.15 hours of sleep last night and reported to the nursing staff that he slept well last night but then today tells me he did not sleep well and that is why he has been sleeping most of the day.  I have only seen the patient come out for meals and then his went back to his room to go back to sleep.  Encourage patient to stay in contact with his cousin to find out if he can live there or not and he is informed that the plan was for him to discharge him on Friday and the patient stated understanding.  Since patient is complaining about having poor sleep I have added on trazodone nightly as needed to assist with assisting his sleep schedule.  Patient did not have any complaints about his Prozac being started we will continue it at this time.  Patient's blood sugars tend to be trending better than they were on Monday when I saw the patient.  At 0659 this morning it was 208, at 1022 this morning it was 187, and at 1123 today it was at 192.  Treatment Plan Summary: Daily contact with patient to assess and evaluate symptoms and progress in treatment and Medication management Continue Prozac 20 mg p.o. daily for depression Continue Neurontin 600 mg p.o. 3 times daily for neuropathy Continue Vistaril 25 mg p.o. every 6 hours as needed for anxiety Continue all insulin orders that are currently prescribed as patient seems to be maintaining a more stable blood sugar Start trazodone 50 mg p.o. nightly as needed for insomnia Encourage group  therapy participation Continue every 15 minute safety checks  Lewis Shock, FNP 04/05/2019, 1:59 PM

## 2019-04-05 NOTE — Progress Notes (Signed)
Inpatient Diabetes Program Recommendations  AACE/ADA: New Consensus Statement on Inpatient Glycemic Control   Target Ranges:  Prepandial:   less than 140 mg/dL      Peak postprandial:   less than 180 mg/dL (1-2 hours)      Critically ill patients:  140 - 180 mg/dL   Results for TREVONTAE, LINDAHL (MRN [id number]) as of 04/05/2019 07:53  Ref. Range 04/04/2019 07:01 04/04/2019 11:09 04/04/2019 16:09 04/04/2019 20:53 04/05/2019 06:59  Glucose-Capillary Latest Ref Range: 70 - 99 mg/dL 368 (H) 284 (H) 239 (H) 312 (H) 208 (H)   Review of Glycemic Control  History:DM, Depression, Homelessness   Home DM Meds: Lantus 35units Daily Novolog 6 units TID   Current Orders: Lantus 20 units QAM, Lantus 15 unitsQHS,Novolog0-9 units TID with meals,Novolog 0-5 units QHS, Novolog 5 units TID with meals  PCP: [facility] (gets Insulin at [place])  Inpatient Diabetes Program Recommendations:  Insulin-Basal: In reviewing chart, noted patient did NOT receive morning Lantus dose on 04/04/19 (charted as contraindicated at Bedtime). Lantus is ordered for 20 units QAM and 15 units QHS. RN, please administer Lantus doses as ordered.  NOTE: Sent chat message to Polly Cobia, RN and Dr. Weber Cooks at 7:55 am today to make MD aware patient did not receive morning dose yesterday and ask that RN administer Lantus as ordered.  Thanks, Barnie Alderman, RN, MSN, CDE Diabetes Coordinator Inpatient Diabetes Program [phone] (Team Pager from 8am to 5pm)

## 2019-04-05 NOTE — [facility] Group Notes (Signed)
[facility] Group Notes:  (Nursing/MHT/Case Management/Adjunct)  Date:  04/05/2019  Time:  8:49 PM  Type of Therapy:  Group Therapy  Participation Level:  Active  Participation Quality:  Sharing and He is upset because he want to get his clothes form [place] and he want to go to [place].  Affect:  Angry  Cognitive:  Alert  Insight:  Good  Engagement in Group:  Engaged  Modes of Intervention:  Support  Summary of Progress/Problems:  Cody Campbell 04/05/2019, 8:49 PM

## 2019-04-06 LAB — GLUCOSE, CAPILLARY
Glucose-Capillary: 257 mg/dL — ABNORMAL HIGH (ref 70–99)
Glucose-Capillary: 71 mg/dL (ref 70–99)
Glucose-Capillary: 268 mg/dL — ABNORMAL HIGH (ref 70–99)
Glucose-Capillary: 254 mg/dL — ABNORMAL HIGH (ref 70–99)

## 2019-04-06 MED ORDER — FLUOXETINE HCL 20 MG PO CAPS: 20.0000 mg | ORAL_CAPSULE | Freq: Every day | ORAL | 0 refills | Status: DC

## 2019-04-06 MED ORDER — INSULIN ASPART 100 UNIT/ML ~~LOC~~ SOLN: 0.0000 [IU] | Freq: Three times a day (TID) | SUBCUTANEOUS | 0 refills | Status: DC

## 2019-04-06 MED ORDER — INSULIN GLARGINE 100 UNIT/ML ~~LOC~~ SOLN: 20.0000 [IU] | Freq: Every day | SUBCUTANEOUS | 0 refills | Status: DC

## 2019-04-06 MED ORDER — TRAZODONE HCL 50 MG PO TABS: 50.0000 mg | ORAL_TABLET | Freq: Every evening | ORAL | 0 refills | Status: DC | PRN

## 2019-04-06 MED ORDER — INSULIN GLARGINE 100 UNIT/ML ~~LOC~~ SOLN: 15.0000 [IU] | Freq: Every day | SUBCUTANEOUS | 0 refills | Status: DC

## 2019-04-06 MED ORDER — GABAPENTIN 600 MG PO TABS: 600.0000 mg | ORAL_TABLET | Freq: Three times a day (TID) | ORAL | 0 refills | Status: DC

## 2019-04-06 MED ORDER — HYDROXYZINE HCL 25 MG PO TABS: 25.0000 mg | ORAL_TABLET | Freq: Four times a day (QID) | ORAL | 0 refills | Status: DC | PRN

## 2019-04-06 NOTE — Progress Notes (Signed)
D - Patient was in the day room upon arrival. Patient was pleasant during assessment denying SI/HI/AVH, pain, anxiety and depression. Patient was educated today on a carb modified diet and ate carb modified snack this evening. Patient still presenting with a sad/depressed affect stating he doesn't want to leave tomorrow and wishes he could stay here. Patient given education.   A - Patient compliant with medication administration per MD orders. Patient given education. Patient given support and encouragement to be active in his treatment plan. Patient informed to let staff know if there are any issues or problems on the unit.   R - Patient being monitored Q 15 minutes for safety per unit protocol. Patient remains safe on the unit.

## 2019-04-06 NOTE — BHH Counselor (Signed)
CSW staff spoke with the patient about the following things: -If patient has an ID for [employer], pt reports that he does -Pt could follow up with aftercare at [facility] and [facility] in [place] for psychiatric and physical health, respectively.   -Patient attempted to call the cousin he can stay with in [place] however, was unable to get through. -CSW staff reviewed the work Actuary, religious component and screening responsibilities of the [facility] reviewed the bus fares in [place]  Pt was upset at the idea of discharging to [employer] as he may not be able to get to his storage unit and get important documents.  CSW encouraged patient to call his son or a peer to see if they would get his items for him and/or arrange transportation to the shelter if needed.  Though patient appeared disgruntled about going to [facility] he signed for [employer] and [place].      [name], MSW, LCSW 04/06/2019 1:49 PM

## 2019-04-06 NOTE — Tx Team (Signed)
Interdisciplinary Treatment and Diagnostic Plan Update  04/06/2019 Time of Session: 830am Cody Campbell. Danzy MRN: [id number]  Principal Diagnosis: Adjustment disorder with mixed anxiety and depressed mood  Secondary Diagnoses: Principal Problem:   Adjustment disorder with mixed anxiety and depressed mood Active Problems:   DM (diabetes mellitus), type 2, uncontrolled (HCC)   Hypertension   Diabetic neuropathy (HCC)   Current Medications:  Current Facility-Administered Medications  Medication Dose Route Frequency Provider Last Rate Last Dose  . FLUoxetine (PROZAC) capsule 20 mg  20 mg Oral Daily Malvin Johns, MD   20 mg at 04/06/19 0752  . fluticasone (FLONASE) 50 MCG/ACT nasal spray 2 spray  2 spray Each Nare Daily Clapacs, Jackquline Denmark, MD   2 spray at 04/01/19 [phone]  . gabapentin (NEURONTIN) tablet 600 mg  600 mg Oral TID Clapacs, John T, MD   600 mg at 04/06/19 1226  . hydrOXYzine (ATARAX/VISTARIL) tablet 25 mg  25 mg Oral Q6H PRN Gillermo Murdoch, NP   25 mg at 04/01/19 2048  . ibuprofen (ADVIL) tablet 800 mg  800 mg Oral BID PRN Malvin Johns, MD   800 mg at 04/04/19 0010  . insulin aspart (novoLOG) injection 0-5 Units  0-5 Units Subcutaneous QHS Money, [name], FNP   4 Units at 04/04/19 2212  . insulin aspart (novoLOG) injection 0-9 Units  0-9 Units Subcutaneous TID WC Money, [name], FNP   5 Units at 04/06/19 0754  . insulin aspart (novoLOG) injection 5 Units  5 Units Subcutaneous TID WC Money, [name], FNP   5 Units at 04/06/19 1227  . insulin glargine (LANTUS) injection 15 Units  15 Units Subcutaneous QHS Money, [name], [place]   15 Units at 04/05/19 2113  . insulin glargine (LANTUS) injection 20 Units  20 Units Subcutaneous Daily Money, [name], FNP   20 Units at 04/06/19 1029  . loperamide (IMODIUM) capsule 4 mg  4 mg Oral PRN Clapacs, Jackquline Denmark, MD   4 mg at 03/28/19 1708  . magnesium hydroxide (MILK OF MAGNESIA) suspension 30 mL  30 mL Oral Daily PRN Gillermo Murdoch, NP      .  ondansetron (ZOFRAN) tablet 4 mg  4 mg Oral Q6H PRN Clapacs, John T, MD      . oxymetazoline (AFRIN) 0.05 % nasal spray 1 spray  1 spray Each Nare BID Clapacs, Jackquline Denmark, MD   1 spray at 04/06/19 [phone]  . pantoprazole (PROTONIX) EC tablet 40 mg  40 mg Oral Daily Money, [name], FNP   40 mg at 04/06/19 9628  . simethicone (MYLICON) chewable tablet 80 mg  80 mg Oral QID PRN Money, [name], FNP      . traZODone (DESYREL) tablet 50 mg  50 mg Oral QHS PRN Money, [name], FNP       PTA Medications: Medications Prior to Admission  Medication Sig Dispense Refill Last Dose  . aspirin-sod bicarb-citric acid (ALKA-SELTZER) 325 MG TBEF tablet Take 325 mg by mouth daily as needed (for acid).     . ferrous sulfate (FERROUSUL) 325 (65 FE) MG tablet Take 1 tablet (325 mg total) by mouth 2 (two) times daily with a meal. 60 tablet 3   . gabapentin (NEURONTIN) 300 MG capsule Take 2 capsules (600 mg total) by mouth 3 (three) times daily. 180 capsule 3   . hydrALAZINE (APRESOLINE) 25 MG tablet Take 1 tablet (25 mg total) by mouth every 6 (six) hours as needed (SBP greater than 170 mmHg). 6 tablet  0   . insulin aspart (NOVOLOG) 100 UNIT/ML injection Inject 0-12 Units into the skin 3 (three) times daily with meals. As per sliding scale (Patient taking differently: Inject 6 Units into the skin 3 (three) times daily with meals. As per sliding scale) 10 mL 3   . insulin glargine (LANTUS) 100 UNIT/ML injection Inject 0.4 mLs (40 Units total) into the skin 2 (two) times daily. (Patient taking differently: Inject 35 Units into the skin daily. ) 30 mL 3   . Insulin Syringe-Needle U-100 (B-D INS SYR ULTRAFINE 1CC/31G) 31G X 5/16" 1 ML MISC Use for administration of insulin. 100 each 12   . sertraline (ZOLOFT) 50 MG tablet Take 1 tablet (50 mg total) by mouth daily. 4 tablet 0     Patient Stressors:    Patient Strengths:    Treatment Modalities: Medication Management, Group therapy, Case management,  1 to 1 session with  clinician, Psychoeducation, Recreational therapy.   Physician Treatment Plan for Primary Diagnosis: Adjustment disorder with mixed anxiety and depressed mood Long Term Goal(s): Improvement in symptoms so as ready for discharge Improvement in symptoms so as ready for discharge   Short Term Goals: Ability to verbalize feelings will improve Ability to demonstrate self-control will improve Ability to identify and develop effective coping behaviors will improve Ability to maintain clinical measurements within normal limits will improve  Medication Management: Evaluate patient's response, side effects, and tolerance of medication regimen.  Therapeutic Interventions: 1 to 1 sessions, Unit Group sessions and Medication administration.  Evaluation of Outcomes: Progressing  Physician Treatment Plan for Secondary Diagnosis: Principal Problem:   Adjustment disorder with mixed anxiety and depressed mood Active Problems:   DM (diabetes mellitus), type 2, uncontrolled (HCC)   Hypertension   Diabetic neuropathy (HCC)  Long Term Goal(s): Improvement in symptoms so as ready for discharge Improvement in symptoms so as ready for discharge   Short Term Goals: Ability to verbalize feelings will improve Ability to demonstrate self-control will improve Ability to identify and develop effective coping behaviors will improve Ability to maintain clinical measurements within normal limits will improve     Medication Management: Evaluate patient's response, side effects, and tolerance of medication regimen.  Therapeutic Interventions: 1 to 1 sessions, Unit Group sessions and Medication administration.  Evaluation of Outcomes: Progressing   RN Treatment Plan for Primary Diagnosis: Adjustment disorder with mixed anxiety and depressed mood Long Term Goal(s): Knowledge of disease and therapeutic regimen to maintain health will improve  Short Term Goals: Ability to verbalize frustration and anger  appropriately will improve, Ability to participate in decision making will improve, Ability to disclose and discuss suicidal ideas and Ability to identify and develop effective coping behaviors will improve  Medication Management: RN will administer medications as ordered by provider, will assess and evaluate patient's response and provide education to patient for prescribed medication. RN will report any adverse and/or side effects to prescribing provider.  Therapeutic Interventions: 1 on 1 counseling sessions, Psychoeducation, Medication administration, Evaluate responses to treatment, Monitor vital signs and CBGs as ordered, Perform/monitor CIWA, COWS, AIMS and Fall Risk screenings as ordered, Perform wound care treatments as ordered.  Evaluation of Outcomes: Progressing   LCSW Treatment Plan for Primary Diagnosis: Adjustment disorder with mixed anxiety and depressed mood Long Term Goal(s): Safe transition to appropriate next level of care at discharge, Engage patient in therapeutic group addressing interpersonal concerns.  Short Term Goals: Engage patient in aftercare planning with referrals and resources, Increase social support, Identify triggers associated with  mental health/substance abuse issues and Increase skills for wellness and recovery  Therapeutic Interventions: Assess for all discharge needs, 1 to 1 time with Social worker, Explore available resources and support systems, Assess for adequacy in community support network, Educate family and significant other(s) on suicide prevention, Complete Psychosocial Assessment, Interpersonal group therapy.  Evaluation of Outcomes: Progressing   Progress in Treatment: Attending groups: No. Participating in groups: No. Taking medication as prescribed: Yes. Toleration medication: Yes. Family/Significant other contact made: Yes, individual(s) contacted:  Shelva Majestic, son Patient understands diagnosis: Yes. Discussing patient  identified problems/goals with staff: Yes. Medical problems stabilized or resolved: Yes. Denies suicidal/homicidal ideation: Yes. Issues/concerns per patient self-inventory: No. Other: N/A  New problem(s) identified: No, Describe:  none  New Short Term/Long Term Goal(s): Detox, elimination of AVH/symptoms of psychosis, medication management for mood stabilization; elimination of SI thoughts; development of comprehensive mental wellness/sobriety plan.   Patient Goals:  "getting my health straight and get a roof over my head"  Discharge Plan or Barriers: SPE pamphlet, Mobile Crisis information, and AA/NA information provided to patient for additional community support and resources. Pt declined referral for mental health treatment reporting he does not need it. CSW assessing for appropriate referrals. Update 04/06/19-Pt is reaching out to family members to see if he can live with them. Pt discussed possibly living with a cousin in [place] but this has not been confirmed. CSW team has discussed with pt homeless shelters([facility] or shelters in [place]) as an alternative if he is not able to live with family.  Reason for Continuation of Hospitalization: Pt is scheduled to discharge on 04/07/19  Estimated Length of Stay:D/C 04/06/19  Recreational Therapy: Patient: N/A Patient Goal: Patient will engage in groups without prompting or encouragement from LRT x3 group sessions within 5 recreation therapy group sessions  Attendees: Patient: 04/01/2019   Physician: Alethia Berthold 04/01/2019   Nursing: Darnelle Maffucci Money 04/01/2019   RN Care Manager: 04/01/2019   Social Worker: Sanjuana Kava Boswell Moton 04/01/2019   Recreational Therapist:  04/01/2019   Other:  04/01/2019   Other:  04/01/2019   Other: 04/01/2019        Scribe for Treatment Team: Yvette Rack, LCSW 04/06/2019 2:18 PM

## 2019-04-06 NOTE — BHH Counselor (Signed)
CSW staff spoke with the patient again to follow up on if he has spoken to his son and or peer.    Patient reports that he has to go to [place] because "that's where all my resources and everything is".  He reports that he sees [facility] for PCP and would like to continue with them.    CSW discussed aftercare for psychiatric needs.  Pt consented to referral to [facility] [place].  Pt signed consent for [facility] and his PCP.   Pt is declining [facility] and requesting to go to a shelter in [place].  CSW pointed out that if no beds are available in [place], recommendation for [place], MSW, [place] 04/06/2019 1:54 PM

## 2019-04-06 NOTE — Progress Notes (Signed)
CSW contacted Rachael Pontillo with [facility] at [phone] and left a voicemail requesting a call back. [facility] [phone], "call could not be completed at this time". [facility] [phone] reported that they do not have any beds currently and are scheduling intake appointments. CSW contacted [facility] who reported they do have beds and pt would have to be accepted on a first come, first serve basis and they do intakes from 8am-5pm, once you are accepted the first time you are guaranteed a bed from there on out as long as you are at the shelter before 5pm daily. CSW educated client on [facility], but client declined stating that he needs to be in [place] and all his resources are in [place]. We asked if pt had a housing resource in [place], pt reported "no". Pt reported plans to leave the hospital and go to his storage unit to retrieve his belongings. Pt reported he will figure out his housing at discharge and reported plans to call his cousin later this evening and his friend who bought him into the hospital to inquire if they are willing to let him stay with them until he can get into a shelter in [place]. Pt provided with shelter resource list and expressed plan to call [facility] and schedule an intake. Pt continues to decline [facility] at this time due to wanting to stay in [place]. CSW team will check in with pt tomorrow morning with finalized plan after pt has spoke with his cousin and friend regarding housing options.    Evalina Field, MSW, LCSW Clinical Social Work 04/06/2019 3:35 PM

## 2019-04-06 NOTE — Progress Notes (Signed)
[name] Progress Note  04/06/2019 3:21 PM Cody Campbell. Pile  MRN:  [id number]   Subjective: This is a follow-up on the patient diagnosed with adjustment disorder with mixed anxiety and depressed mood.  Patient reports that he has started being woken up today.  He states that "you are about the 15th person to come in and wake me up while trying to rest."  Patient reports that he is planning on going to [facility] so he can get his belongings out of the storage unit tomorrow after he discharges.  He also reports that he has more resources in [place] and his plan will continue with his primary care in [place] at this moment.  Patient continues to deny any suicidal homicidal ideations and denies any hallucinations.  Patient continues to have some complaints about staff and the way things went during his stay at the hospital but then states "but you can always have what you want."  Principal Problem: Adjustment disorder with mixed anxiety and depressed mood Diagnosis: Principal Problem:   Adjustment disorder with mixed anxiety and depressed mood Active Problems:   DM (diabetes mellitus), type 2, uncontrolled (HCC)   Hypertension   Diabetic neuropathy (HCC)  Total Time spent with patient: 30 minutes  Past Psychiatric History: Patient claims to have no past psychiatric history.  Says he is never seen a therapist psychiatrist or mental health provider.  I mention to him that a note in the chart says that he had been prescribed an antidepressant medicine at one time.  Patient acknowledges that but says he did not take it for long did not follow-up and cannot remember what the name of it was.  He denies ever having tried to hurt himself in the past.  Denies any history of mania.  Denies any history anytime as an adult of abusing alcohol or drugs.  Past Medical History:  Past Medical History:  Diagnosis Date  . Diabetes mellitus without complication (HCC)   . Elevated LFTs 05/21/2011  . Hyperlipidemia    . Hypertension    History reviewed. No pertinent surgical history. Family History:  Family History  Problem Relation Age of Onset  . Diabetes Mother   . Hypertension Father   . Diabetes Father   . Heart disease Father    Family Psychiatric  History: Patient denies any family history saying "I am from a family of strong people" Social History:  Social History   Substance and Sexual Activity  Alcohol Use Yes  . Alcohol/week: 1.0 standard drinks  . Types: 1 Shots of liquor per week   Comment: occasionally, 1 beer a day     Social History   Substance and Sexual Activity  Drug Use Yes  . Types: Marijuana    Social History   Socioeconomic History  . Marital status: Married    Spouse name: Not on file  . Number of children: Not on file  . Years of education: Not on file  . Highest education level: Not on file  Occupational History  . Not on file  Social Needs  . Financial resource strain: Not on file  . Food insecurity    Worry: Not on file    Inability: Not on file  . Transportation needs    Medical: Not on file    Non-medical: Not on file  Tobacco Use  . Smoking status: Current Every Day Smoker    Packs/day: 2.00    Years: 35.00    Pack years: 70.00    Types:  Cigars  . Smokeless tobacco: Current User  . Tobacco comment: Smoking 1-2 small cigars daily  Substance and Sexual Activity  . Alcohol use: Yes    Alcohol/week: 1.0 standard drinks    Types: 1 Shots of liquor per week    Comment: occasionally, 1 beer a day  . Drug use: Yes    Types: Marijuana  . Sexual activity: Yes    Partners: Female    Birth control/protection: Condom  Lifestyle  . Physical activity    Days per week: Not on file    Minutes per session: Not on file  . Stress: Not on file  Relationships  . Social Musicianconnections    Talks on phone: Not on file    Gets together: Not on file    Attends religious service: Not on file    Active member of club or organization: Not on file    Attends  meetings of clubs or organizations: Not on file    Relationship status: Not on file  Other Topics Concern  . Not on file  Social History Narrative  . Not on file   Additional Social History:                         Sleep: Good  Appetite:  Good  Current Medications: Current Facility-Administered Medications  Medication Dose Route Frequency Provider Last Rate Last Dose  . FLUoxetine (PROZAC) capsule 20 mg  20 mg Oral Daily Malvin JohnsFarah, Brian, MD   20 mg at 04/06/19 0752  . fluticasone (FLONASE) 50 MCG/ACT nasal spray 2 spray  2 spray Each Nare Daily Clapacs, Jackquline DenmarkJohn T, MD   2 spray at 04/01/19 [phone]  . gabapentin (NEURONTIN) tablet 600 mg  600 mg Oral TID Clapacs, John T, MD   600 mg at 04/06/19 1226  . hydrOXYzine (ATARAX/VISTARIL) tablet 25 mg  25 mg Oral Q6H PRN Gillermo Murdochhompson, Jacqueline, NP   25 mg at 04/01/19 2048  . ibuprofen (ADVIL) tablet 800 mg  800 mg Oral BID PRN Malvin JohnsFarah, Brian, MD   800 mg at 04/04/19 0010  . insulin aspart (novoLOG) injection 0-5 Units  0-5 Units Subcutaneous QHS Towanda Hornstein, Gerlene Burdockravis B, FNP   4 Units at 04/04/19 2212  . insulin aspart (novoLOG) injection 0-9 Units  0-9 Units Subcutaneous TID WC Shavontae Gibeault, Gerlene Burdockravis B, FNP   5 Units at 04/06/19 0754  . insulin aspart (novoLOG) injection 5 Units  5 Units Subcutaneous TID WC Emmarose Klinke, Gerlene Burdockravis B, FNP   5 Units at 04/06/19 1227  . insulin glargine (LANTUS) injection 15 Units  15 Units Subcutaneous QHS Eliyanna Ault, Gerlene Burdockravis B, [place]   15 Units at 04/05/19 2113  . insulin glargine (LANTUS) injection 20 Units  20 Units Subcutaneous Daily Deari Sessler, Gerlene Burdockravis B, FNP   20 Units at 04/06/19 1029  . loperamide (IMODIUM) capsule 4 mg  4 mg Oral PRN Clapacs, Jackquline DenmarkJohn T, MD   4 mg at 03/28/19 1708  . magnesium hydroxide (MILK OF MAGNESIA) suspension 30 mL  30 mL Oral Daily PRN Gillermo Murdochhompson, Jacqueline, NP      . ondansetron (ZOFRAN) tablet 4 mg  4 mg Oral Q6H PRN Clapacs, John T, MD      . oxymetazoline (AFRIN) 0.05 % nasal spray 1 spray  1 spray Each Nare BID Clapacs,  Jackquline DenmarkJohn T, MD   1 spray at 04/06/19 [phone]  . pantoprazole (PROTONIX) EC tablet 40 mg  40 mg Oral Daily Ike Maragh, Gerlene Burdockravis B, FNP   40 mg at 04/06/19  1610  . simethicone (MYLICON) chewable tablet 80 mg  80 mg Oral QID PRN Jadah Bobak, [name], FNP      . traZODone (DESYREL) tablet 50 mg  50 mg Oral QHS PRN Natalia Wittmeyer, [name], FNP        Lab Results:  Results for orders placed or performed during the hospital encounter of 03/23/19 (from the past 48 hour(s))  Glucose, capillary     Status: Abnormal   Collection Time: 04/04/19  4:09 PM  Result Value Ref Range   Glucose-Capillary 239 (H) 70 - 99 mg/dL   Comment 1 Notify RN   Glucose, capillary     Status: Abnormal   Collection Time: 04/04/19  8:53 PM  Result Value Ref Range   Glucose-Capillary 312 (H) 70 - 99 mg/dL  Glucose, capillary     Status: Abnormal   Collection Time: 04/05/19  6:59 AM  Result Value Ref Range   Glucose-Capillary 208 (H) 70 - 99 mg/dL  Glucose, capillary     Status: Abnormal   Collection Time: 04/05/19 10:22 AM  Result Value Ref Range   Glucose-Capillary 187 (H) 70 - 99 mg/dL  Glucose, capillary     Status: Abnormal   Collection Time: 04/05/19 11:23 AM  Result Value Ref Range   Glucose-Capillary 192 (H) 70 - 99 mg/dL   Comment 1 Notify RN   Glucose, capillary     Status: Abnormal   Collection Time: 04/05/19  4:14 PM  Result Value Ref Range   Glucose-Capillary 118 (H) 70 - 99 mg/dL   Comment 1 Notify RN   Glucose, capillary     Status: Abnormal   Collection Time: 04/05/19  9:02 PM  Result Value Ref Range   Glucose-Capillary 195 (H) 70 - 99 mg/dL   Comment 1 Notify RN   Glucose, capillary     Status: Abnormal   Collection Time: 04/06/19  7:03 AM  Result Value Ref Range   Glucose-Capillary 257 (H) 70 - 99 mg/dL   Comment 1 Notify RN   Glucose, capillary     Status: None   Collection Time: 04/06/19 11:38 AM  Result Value Ref Range   Glucose-Capillary 71 70 - 99 mg/dL   Comment 1 Notify RN     Blood Alcohol level:  No  results found for: [facility]  Metabolic Disorder Labs: Lab Results  Component Value Date   HGBA1C 10.0 (H) 03/18/2019   MPG 240 03/18/2019   No results found for: PROLACTIN Lab Results  Component Value Date   CHOL 189 01/25/2019   TRIG 82 01/25/2019   HDL 71 01/25/2019   CHOLHDL 2.7 01/25/2019   VLDL 11 05/15/2013   LDLCALC 102 (H) 01/25/2019   LDLCALC 80 05/15/2013    Physical Findings: AIMS:  , ,  ,  ,    CIWA:    COWS:     Musculoskeletal: Strength & Muscle Tone: within normal limits Gait & Station: normal Patient leans: N/A  Psychiatric Specialty Exam: Physical Exam  Nursing note and vitals reviewed. Constitutional: He is oriented to person, place, and time. He appears well-developed and well-nourished.  Respiratory: Effort normal.  Musculoskeletal: Normal range of motion.  Neurological: He is alert and oriented to person, place, and time.  Skin: Skin is warm.    Review of Systems  Constitutional: Negative.   HENT: Negative.   Eyes: Negative.   Respiratory: Negative.   Cardiovascular: Negative.   Gastrointestinal: Negative.   Genitourinary: Negative.   Musculoskeletal: Negative.   Skin:  Negative.   Neurological: Negative.   Endo/Heme/Allergies: Negative.   Psychiatric/Behavioral: Negative.     Blood pressure 100/80, pulse (!) 107, temperature 98.7 F (37.1 C), temperature source Oral, resp. rate 18, height 6\' 2"  (1.88 m), weight 74.8 kg, SpO2 100 %.Body mass index is 21.17 kg/m.  General Appearance: Casual  Eye Contact:  Fair  Speech:  Clear and Coherent and Normal Rate  Volume:  Normal  Mood:  Euthymic  Affect:  Flat  Thought Process:  Coherent and Descriptions of Associations: Intact  Orientation:  Full (Time, Place, and Person)  Thought Content:  WDL  Suicidal Thoughts:  No  Homicidal Thoughts:  No  Memory:  Immediate;   Good Recent;   Good Remote;   Good  Judgement:  Good  Insight:  Good  Psychomotor Activity:  Normal  Concentration:   Concentration: Good  Recall:  Good  Fund of Knowledge:  Good  Language:  Good  Akathisia:  No  Handed:  Right  AIMS (if indicated):     Assets:  Communication Skills Desire for Improvement Resilience  ADL's:  Intact  Cognition:  WNL  Sleep:  Number of Hours: 6   Assessment: Patient presents in his bed and is lying in the dark but is awake.  Patient again appears irritable and has multiple complaints as far as being in the hospital and staff coming to check on him.  Biggest complaint was about being checked on by the every 15 minute Rober today.  Patient does discuss having plans for discharge and where he is going to follow-up and where he is going to stay.  Patient was given options of going to [place] go to the shelter or to the [facility].  Per the chart review and per the patient the patient has decided to go to [facility] and will try to find a shelter staying.  Patient has continued to deny any suicidal homicidal ideations and has denied any hallucinations.  Patient did request to see if the hospital could assist him with a cane or with a walker and I will follow-up with that to see if we can provide the patient with some assistance with that.  Treatment Plan Summary: Daily contact with patient to assess and evaluate symptoms and progress in treatment and Medication management Continue Prozac 20 mg p.o. daily for depression Continue Neurontin 600 mg p.o. 3 times daily for neuropathy Continue Vistaril 25 mg p.o. every 6 hours as needed for anxiety Continue all insulin orders that are currently prescribed as patient seems to be maintaining a more stable blood sugar Continue trazodone 50 mg p.o. nightly as needed for insomnia Encourage group therapy participation Continue every 15 minute safety checks Plan discharge for tomorrow  Lewis Shock, FNP 04/06/2019, 3:21 PM

## 2019-04-06 NOTE — BHH Counselor (Signed)
CSW spoke with the Lenis Noon at [facility].  CSW asked if the pt being on insulin and utilizing a walker would be a problem at the shelter.   Per Truman Hayward, the insulin can be kept in the supervisors office and that others are in the shelter who use a walker with no problem.   He emphasized that pt must be willing to work the required 40 hours.  [name], MSW, LCSW 04/06/2019 2:58 PM

## 2019-04-06 NOTE — Plan of Care (Signed)
Patient upset that he is leaving tomorrow and wishes he could stay longer  Problem: Education: Goal: Emotional status will improve Outcome: Not Progressing Goal: Mental status will improve Outcome: Not Progressing

## 2019-04-06 NOTE — Progress Notes (Signed)
Recreation Therapy Notes  Date: 04/06/2019  Time: 9:30 am   Location: Craft room   Behavioral response: N/A   Intervention Topic: Decision making  Discussion/Intervention: Patient did not attend group.   Clinical Observations/Feedback:  Patient did not attend group.   Letonya Mangels LRT/CTRS        Delvecchio Madole 04/06/2019 10:57 AM

## 2019-04-06 NOTE — Plan of Care (Signed)
D- Patient alert and oriented. Patient presents in a depressed mood on assessment stating that he didn't sleep last night and had a lot to complain about to this Probation officer. Patient continues to complain of pain, rating his level an "8/10", stating that he has neuropathy, however, he did not request any extra pain medication other than his scheduled meds. Patient endorsed depression and anxiety, stating that he's upset because he just started getting treated for his depression yesterday and he's leaving tomorrow. Patient endorsed anxiety stating that he's anxious to "get my hands around the necks of my immediate supervisors", reporting that they are the ones that screwed him over. Patient denies SI, HI, AVH, at this time. Patient had no stated goals for today.   A- Scheduled medications administered to patient, per MD orders. Support and encouragement provided.  Routine safety checks conducted every 15 minutes.  Patient informed to notify staff with problems or concerns.  R- No adverse drug reactions noted. Patient contracts for safety at this time. Patient compliant with medications and treatment plan. Patient receptive, calm, and cooperative. Patient interacts well with others on the unit.  Patient remains safe at this time.  Problem: Education: Goal: Knowledge of [place] [facility] Education information/materials will improve Outcome: Progressing Goal: Emotional status will improve Outcome: Progressing Goal: Mental status will improve Outcome: Progressing Goal: Verbalization of understanding the information provided will improve Outcome: Progressing   Problem: Activity: Goal: Interest or engagement in activities will improve Outcome: Progressing Goal: Sleeping patterns will improve Outcome: Progressing   Problem: Coping: Goal: Ability to verbalize frustrations and anger appropriately will improve Outcome: Progressing Goal: Ability to demonstrate self-control will improve Outcome:  Progressing   Problem: Health Behavior/Discharge Planning: Goal: Identification of resources available to assist in meeting health care needs will improve Outcome: Progressing Goal: Compliance with treatment plan for underlying cause of condition will improve Outcome: Progressing   Problem: Physical Regulation: Goal: Ability to maintain clinical measurements within normal limits will improve Outcome: Progressing   Problem: Safety: Goal: Periods of time without injury will increase Outcome: Progressing

## 2019-04-06 NOTE — Progress Notes (Signed)
Inpatient Diabetes Program Recommendations  AACE/ADA: New Consensus Statement on Inpatient Glycemic Control   Target Ranges:  Prepandial:   less than 140 mg/dL      Peak postprandial:   less than 180 mg/dL (1-2 hours)      Critically ill patients:  140 - 180 mg/dL   Results for ELMON, SHADER (MRN [id number]) as of 04/06/2019 08:35  Ref. Range 04/05/2019 06:59 04/05/2019 10:22 04/05/2019 11:23 04/05/2019 16:14 04/05/2019 21:02 04/06/2019 07:03  Glucose-Capillary Latest Ref Range: 70 - 99 mg/dL 208 (H)  Novolog 8 units 187 (H)   Lantus 20 units 192 (H)  Novolog 7 units 118 (H)  Novolog 5 units 195 (H)   Lantus 15 units 257 (H)  Novolog 10 units    Review of Glycemic Control  History:DM, Depression, Homelessness   Home DM Meds: Lantus 35units Daily Novolog 6 units TID   Current Orders:Lantus 20 units QAM,Lantus15 unitsQHS,Novolog0-9 units TID with meals,Novolog 0-5 units QHS,Novolog5units TID with meals  PCP: [facility] (gets Insulin at [place])   Inpatient Diabetes Program Recommendations:   Insulin-Basal: Please consider changing Lantus to 20 units BID (to be given at 10am and 10pm).  Thanks, Barnie Alderman, RN, MSN, CDE Diabetes Coordinator Inpatient Diabetes Program [phone] (Team Pager from 8am to 5pm)

## 2019-04-06 NOTE — BHH Group Notes (Signed)
Balance In Life 04/06/2019 1PM  Type of Therapy/Topic:  Group Therapy:  Balance in Life  Participation Level:  Did Not Attend  Description of Group:   This group will address the concept of balance and how it feels and looks when one is unbalanced. Patients will be encouraged to process areas in their lives that are out of balance and identify reasons for remaining unbalanced. Facilitators will guide patients in utilizing problem-solving interventions to address and correct the stressor making their life unbalanced. Understanding and applying boundaries will be explored and addressed for obtaining and maintaining a balanced life. Patients will be encouraged to explore ways to assertively make their unbalanced needs known to significant others in their lives, using other group members and facilitator for support and feedback.  Therapeutic Goals: 1. Patient will identify two or more emotions or situations they have that consume much of in their lives. 2. Patient will identify signs/triggers that life has become out of balance:  3. Patient will identify two ways to set boundaries in order to achieve balance in their lives:  4. Patient will demonstrate ability to communicate their needs through discussion and/or role plays  Summary of Patient Progress:    Therapeutic Modalities:   Cognitive Behavioral Therapy Solution-Focused Therapy Assertiveness Training  Curran Lenderman T Genavive Kubicki, LCSW  

## 2019-04-07 LAB — GLUCOSE, CAPILLARY: Glucose-Capillary: 209 mg/dL — ABNORMAL HIGH (ref 70–99)

## 2019-04-07 MED ORDER — TRAZODONE HCL 50 MG PO TABS: 50.0000 mg | ORAL_TABLET | Freq: Every evening | ORAL | 1 refills | Status: AC | PRN

## 2019-04-07 MED ORDER — GABAPENTIN 600 MG PO TABS: 600.0000 mg | ORAL_TABLET | Freq: Three times a day (TID) | ORAL | 1 refills | Status: AC

## 2019-04-07 MED ORDER — HYDROXYZINE HCL 25 MG PO TABS: 25.0000 mg | ORAL_TABLET | Freq: Four times a day (QID) | ORAL | 1 refills | Status: AC | PRN

## 2019-04-07 MED ORDER — INSULIN GLARGINE 100 UNIT/ML ~~LOC~~ SOLN: 20.0000 [IU] | Freq: Every day | SUBCUTANEOUS | 1 refills | Status: AC

## 2019-04-07 MED ORDER — FLUOXETINE HCL 20 MG PO CAPS: 20.0000 mg | ORAL_CAPSULE | Freq: Every day | ORAL | 1 refills | Status: AC

## 2019-04-07 MED ORDER — INSULIN ASPART 100 UNIT/ML ~~LOC~~ SOLN: 0.0000 [IU] | Freq: Three times a day (TID) | SUBCUTANEOUS | 1 refills | Status: AC

## 2019-04-07 NOTE — Progress Notes (Signed)
Recreation Therapy Notes   Date: 04/07/2019  Time: 9:30 am   Location: Craft room   Behavioral response: N/A   Intervention Topic: Self-care  Discussion/Intervention: Patient did not attend group.   Clinical Observations/Feedback:  Patient did not attend group.   Javanni Maring LRT/CTRS         Siniyah Evangelist 04/07/2019 11:41 AM

## 2019-04-07 NOTE — Discharge Summary (Signed)
Physician Discharge Summary Note  Patient:  Cody Campbell D. [name] is an 53 y.o., male MRN:  [id number] DOB:  06/22/1965 Patient phone:  [phone] (home)  [place]   [place] [id number],  Total Time spent with patient: 30 minutes  Date of Admission:  03/23/2019 Date of Discharge: 04/07/19  Reason for Admission:  [facility] being evaluated in the hospital and allegedly making suicidal statements.  Patient's chief complaint to me "I was blindsided".  Patient tells a story of multiple rapid social losses.  He says that a few weeks ago he turned in a note at his work saying that he could no longer climb ladders because of his diabetic neuropathy.  The day after that he claims he was late to work and was fired.  He finds the situation extremely unjust.  After being fired he was denied unemployment.  Has lost his house and now his car as well.  Was using his remaining finances to stay in a hotel but now has run out of [place].  A friend got very worried about him and made him come into the hospital.  Patient reportedly made a statement about suicide while he was [facility].  In interview today he says that he might of said something about being "better off dead" but he absolutely denies any thoughts of wanting to die or wanting to kill himself.  He says that his problems are all right now due to his social situation and his medical problems.  Minimizes depression.  Admits to sleeping poorly but blames that on his pain.  Patient denies feeling hopeless or helpless.  Denies suicidal thoughts.  Denies any psychotic symptoms.  Has not been getting any outpatient psychiatric treatment.  Does not abuse alcohol or drugs.  Principal Problem: Adjustment disorder with mixed anxiety and depressed mood Discharge Diagnoses: Principal Problem:   Adjustment disorder with mixed anxiety and depressed mood Active Problems:   DM (diabetes mellitus), type 2, uncontrolled (HCC)    Hypertension   Diabetic neuropathy (HCC)   Past Psychiatric History: Patient claims to have no past psychiatric history.  Says he is never seen a therapist psychiatrist or mental health provider.  I mention to him that a note in the chart says that he had been prescribed an antidepressant medicine at one time.  Patient acknowledges that but says he did not take it for long did not follow-up and cannot remember what the name of it was.  He denies ever having tried to hurt himself in the past.  Denies any history of mania.  Denies any history anytime as an adult of abusing alcohol or drugs.  Past Medical History:  Past Medical History:  Diagnosis Date  . Diabetes mellitus without complication (HCC)   . Elevated LFTs 05/21/2011  . Hyperlipidemia   . Hypertension    History reviewed. No pertinent surgical history. Family History:  Family History  Problem Relation Age of Onset  . Diabetes Mother   . Hypertension Father   . Diabetes Father   . Heart disease Father    Family Psychiatric  History: Denies Social History:  Social History   Substance and Sexual Activity  Alcohol Use Yes  . Alcohol/week: 1.0 standard drinks  . Types: 1 Shots of liquor per week   Comment: occasionally, 1 beer a day     Social History   Substance and Sexual Activity  Drug Use Yes  . Types: Marijuana    Social History  Socioeconomic History  . Marital status: Married    Spouse name: Not on file  . Number of children: Not on file  . Years of education: Not on file  . Highest education level: Not on file  Occupational History  . Not on file  Social Needs  . Financial resource strain: Not on file  . Food insecurity    Worry: Not on file    Inability: Not on file  . Transportation needs    Medical: Not on file    Non-medical: Not on file  Tobacco Use  . Smoking status: Current Every Day Smoker    Packs/day: 2.00    Years: 35.00    Pack years: 70.00    Types: Cigars  . Smokeless tobacco:  Current User  . Tobacco comment: Smoking 1-2 small cigars daily  Substance and Sexual Activity  . Alcohol use: Yes    Alcohol/week: 1.0 standard drinks    Types: 1 Shots of liquor per week    Comment: occasionally, 1 beer a day  . Drug use: Yes    Types: Marijuana  . Sexual activity: Yes    Partners: Female    Birth control/protection: Condom  Lifestyle  . Physical activity    Days per week: Not on file    Minutes per session: Not on file  . Stress: Not on file  Relationships  . Social Musician on phone: Not on file    Gets together: Not on file    Attends religious service: Not on file    Active member of club or organization: Not on file    Attends meetings of clubs or organizations: Not on file    Relationship status: Not on file  Other Topics Concern  . Not on file  Social History Narrative  . Not on file    Hospital Course:  Patient remained on the [facility] unit for 14 days. The patient stabilized on medication and therapy. Patient was discharged on Prozac 20 mg p.o. daily, Neurontin 600 mg p.o. 3 times daily, Vistaril 25 mg p.o. every 6 hours as needed for anxiety, NovoLog meal coverage sliding scale, Lantus 15 units nightly and 20 units daily, and trazodone 50 mg p.o. nightly as needed for insomnia. Patient has shown improvement with improved mood, affect, sleep, appetite, and interaction. Patient has attended group and participated. Patient has been seen in the day room interacting with peers and staff appropriately. Patient denies any SI/HI/AVH and contracts for safety. Patient agrees to [facility] [place]. Patient is provided with prescriptions for their medications upon discharge.  During the 14-day stay for this patient he would frequently become irritated and complaining about multiple staff members, the food, his diet, and his medications.  He was very difficult to place the patient with everything that he was insisting on.  Patient would  demand to have more food but then would complain about his blood sugar being elevated.  Patient's diabetes was assisted with management by diabetic coordinator.  Patient also used a walker while he was here at the hospital and there is been a PT consult requested to assist the patient with a cane or a walker upon discharge.  There was concerns for the patient's insulin and having to be refrigerated as he will be going to a shelter to live since he is homeless.  Concerns over the patient's safe discharge as he would frequently make vague or passive comments about better off dying  or suicidal ideations but never had any intention or plans.  Patient did come to an agreement to continue follow-up with his current PCP at [facility] [place] and has changed back and forth as to which shelter he will go to when he discharges and the last reported before discharge was [facility].  Patient was encouraged to continue following a carb modified diet and use his insulin as instructed as his blood sugars have been more stable with him following the regimen that he has been on why is been at the hospital.   Physical Findings: AIMS:  , ,  ,  ,    CIWA:    COWS:     Musculoskeletal: Strength & Muscle Tone: within normal limits Gait & Station: normal Patient leans: N/A  Psychiatric Specialty Exam: Physical Exam  Nursing note and vitals reviewed. Constitutional: He is oriented to person, place, and time. He appears well-developed and well-nourished.  Cardiovascular: Normal rate.  Respiratory: Effort normal.  Musculoskeletal: Normal range of motion.  Neurological: He is alert and oriented to person, place, and time.  Skin: Skin is warm.    Review of Systems  Constitutional: Negative.   HENT: Negative.   Eyes: Negative.   Respiratory: Negative.   Cardiovascular: Negative.   Gastrointestinal: Negative.   Genitourinary: Negative.   Musculoskeletal: Negative.   Skin: Negative.    Neurological: Negative.   Endo/Heme/Allergies: Negative.   Psychiatric/Behavioral: The patient is nervous/anxious (About discharging and setting up housing and getting his things from storage).     Blood pressure (!) 149/92, pulse 100, temperature 98.4 F (36.9 C), temperature source Oral, resp. rate 18, height 6\' 2"  (1.88 m), weight 74.8 kg, SpO2 100 %.Body mass index is 21.17 kg/m.  General Appearance: Casual  Eye Contact:  Good  Speech:  Clear and Coherent and Normal Rate  Volume:  Normal  Mood:  Anxious  Affect:  Congruent  Thought Process:  Coherent and Descriptions of Associations: Intact  Orientation:  Full (Time, Place, and Person)  Thought Content:  WDL  Suicidal Thoughts:  No  Homicidal Thoughts:  No  Memory:  Immediate;   Good Recent;   Good Remote;   Good  Judgement:  Good  Insight:  Good  Psychomotor Activity:  Normal  Concentration:  Concentration: Good  Recall:  Good  Fund of Knowledge:  Good  Language:  Good  Akathisia:  No  Handed:  Right  AIMS (if indicated):     Assets:  Communication Skills Desire for Improvement Housing Resilience Transportation  ADL's:  Intact  Cognition:  WNL  Sleep:  Number of Hours: 6.25        Has this patient used any form of tobacco in the last 30 days? (Cigarettes, Smokeless Tobacco, Cigars, and/or Pipes) Yes, No  Blood Alcohol level:  No results found for: [facility]  Metabolic Disorder Labs:  Lab Results  Component Value Date   HGBA1C 10.0 (H) 03/18/2019   MPG 240 03/18/2019   No results found for: PROLACTIN Lab Results  Component Value Date   CHOL 189 01/25/2019   TRIG 82 01/25/2019   HDL 71 01/25/2019   CHOLHDL 2.7 01/25/2019   VLDL 11 05/15/2013   LDLCALC 102 (H) 01/25/2019   [place] 80 05/15/2013    See Psychiatric Specialty Exam and Suicide Risk Assessment completed by Attending Physician prior to discharge.  Discharge destination:  Home  Is patient on multiple antipsychotic therapies at discharge:   No   Has Patient had  three or more failed trials of antipsychotic monotherapy by history:  No  Recommended Plan for Multiple Antipsychotic Therapies: NA  Discharge Instructions    Diet - low sodium heart healthy   Complete by: As directed    Increase activity slowly   Complete by: As directed      Allergies as of 04/07/2019      Reactions   Metformin And Related Diarrhea      Medication List    STOP taking these medications   aspirin-sod bicarb-citric acid 325 MG Tbef tablet Commonly known as: ALKA-SELTZER   ferrous sulfate 325 (65 FE) MG tablet Commonly known as: FerrouSul   gabapentin 300 MG capsule Commonly known as: NEURONTIN Replaced by: gabapentin 600 MG tablet   hydrALAZINE 25 MG tablet Commonly known as: APRESOLINE   sertraline 50 MG tablet Commonly known as: ZOLOFT     TAKE these medications     Indication  FLUoxetine 20 MG capsule Commonly known as: PROZAC Take 1 capsule (20 mg total) by mouth daily.  Indication: Major Depressive Disorder   gabapentin 600 MG tablet Commonly known as: NEURONTIN Take 1 tablet (600 mg total) by mouth 3 (three) times daily. Replaces: gabapentin 300 MG capsule  Indication: Neuropathic Pain   hydrOXYzine 25 MG tablet Commonly known as: ATARAX/VISTARIL Take 1 tablet (25 mg total) by mouth every 6 (six) hours as needed for anxiety.  Indication: Feeling Anxious   insulin aspart 100 UNIT/ML injection Commonly known as: novoLOG Inject 0-12 Units into the skin 3 (three) times daily with meals. As per sliding scale What changed: how much to take  Indication: Type 2 Diabetes   insulin glargine 100 UNIT/ML injection Commonly known as: LANTUS Inject 0.2 mLs (20 Units total) into the skin daily. And 15 units at bedtime What changed:   how much to take  when to take this  additional instructions  Indication: Type 2 Diabetes   Insulin Syringe-Needle U-100 31G X 5/16" 1 ML Misc Commonly known as: B-D INS SYR ULTRAFINE  1CC/31G Use for administration of insulin.  Indication: Diabetes Type II   traZODone 50 MG tablet Commonly known as: DESYREL Take 1 tablet (50 mg total) by mouth at bedtime as needed for sleep.  Indication: Trouble Sleeping      Follow-up Information    [facility] Follow up.   Specialty: Professional Counselor Why: Walk in hours are Monday-Friday from 8:30am-12:00pm and 1:00PM-2:30PM. Please take photo id, discharge paperwork, and list of current meds with you. Thank You! Contact information: [facility] [place] [address] [place] [address] [phone]           Follow-up recommendations:  Continue activity as tolerated. Continue diet as recommended by your PCP. Ensure to keep all appointments with outpatient providers.  Comments:  Patient is instructed prior to discharge to: Take all medications as prescribed by his/her mental healthcare provider. Report any adverse effects and or reactions from the medicines to his/her outpatient provider promptly. Patient has been instructed & cautioned: To not engage in alcohol and or illegal drug use while on prescription medicines. In the event of worsening symptoms, patient is instructed to call the crisis hotline, 911 and or go to the nearest ED for appropriate evaluation and treatment of symptoms. To follow-up with his/her primary care provider for your other medical issues, concerns and or health care needs.    Signed: [name] Jameer Storie, FNP 04/07/2019, 8:16 AM

## 2019-04-07 NOTE — Progress Notes (Signed)
  [facility] Adult Case Management Discharge Plan :  Will you be returning to the same living situation after discharge:  No. At discharge, do you have transportation home?: Yes,  public transportation Do you have the ability to pay for your medications: Yes,  mental health  Release of information consent forms completed and in the chart;   Patient to Follow up at: Follow-up [facility] Follow up.   Why: You must walk in anyday Monday-Friday at 8:30AM to set up services. Please take photo id, any insurance information, list of meds, and hospital discharge paperwork with you. Thank You! Contact information: [place], [place] [address] Phone: [phone] Fax: [facility] Follow up.   Why: Walk in hours are Monday-Friday from 7:30AM-5:00PM. Please take photo id with you. Thank You! Contact information: [place], [place] [address] Phone: [phone] Fax: [phone]          Next level of care provider has access to [place] and Suicide Prevention discussed: Yes,  SPE completed with pts son     Has patient been referred to the Quitline?: Patient refused referral  Patient has been referred for addiction treatment: N/A  Delfin Edis, LCSW 04/07/2019, 9:55 AM

## 2019-04-07 NOTE — Progress Notes (Signed)
Recreation Therapy Notes  INPATIENT RECREATION TR PLAN  Patient Details Name: Cody Campbell MRN: [id number] DOB: 1966-04-21 Today's Date: 04/07/2019  Rec Therapy Plan Is patient appropriate for Therapeutic Recreation?: Yes Treatment times per week: at least 3 Estimated Length of Stay: 5-7 days TR Treatment/Interventions: Group participation (Comment)  Discharge Criteria Pt will be discharged from therapy if:: Discharged Treatment plan/goals/alternatives discussed and agreed upon by:: Patient/family  Discharge Summary Short term goals set: Patient will engage in groups without prompting or encouragement from LRT x3 group sessions within 5 recreation therapy group sessions Short term goals met: Complete Progress toward goals comments: Groups attended Which groups?: Coping skills, Leisure education, Other (Comment)(Happiness, Relaxation) Reason goals not met: N/A Therapeutic equipment acquired: N/A Reason patient discharged from therapy: Discharge from hospital Pt/family agrees with progress & goals achieved: Yes Date patient discharged from therapy: 04/07/19   Cody Campbell 04/07/2019, 11:51 AM

## 2019-04-07 NOTE — Progress Notes (Signed)
Patient denies SI/HI, denies A/V hallucinations. Patient verbalizes understanding of discharge instructions, follow up care and prescriptions.7 days medicines and home medications given to patient.Patient given all belongings from [facility] locker. Patient escorted out by staff, transported by cab.

## 2019-04-07 NOTE — [facility] Suicide Risk Assessment (Signed)
[facility] Discharge Suicide Risk Assessment   Principal Problem: Adjustment disorder with mixed anxiety and depressed mood Discharge Diagnoses: Principal Problem:   Adjustment disorder with mixed anxiety and depressed mood Active Problems:   DM (diabetes mellitus), type 2, uncontrolled ([place])   Hypertension   Diabetic neuropathy ([place])   Total Time spent with patient: 30 minutes  Musculoskeletal: Strength & Muscle Tone: within normal limits Gait & Station: normal Patient leans: N/A  Psychiatric Specialty Exam: Review of Systems  Constitutional: Negative.   HENT: Negative.   Eyes: Negative.   Respiratory: Negative.   Cardiovascular: Negative.   Gastrointestinal: Negative.   Musculoskeletal: Positive for myalgias.  Skin: Negative.   Neurological: Negative.   Psychiatric/Behavioral: Negative for depression, hallucinations, memory loss, substance abuse and suicidal ideas. The patient is not nervous/anxious and does not have insomnia.     Blood pressure (!) 149/92, pulse 100, temperature 98.4 F (36.9 C), temperature source Oral, resp. rate 18, height 6\' 2"  (1.88 m), weight 74.8 kg, SpO2 100 %.Body mass index is 21.17 kg/m.  General Appearance: Casual  Eye Contact::  Fair  Speech:  Clear and [id number]  Volume:  Decreased  Mood:  Dysphoric  Affect:  Appropriate and Congruent  Thought Process:  Coherent and Goal Directed  Orientation:  Full (Time, Place, and Person)  Thought Content:  Logical  Suicidal Thoughts:  No  Homicidal Thoughts:  No  Memory:  Immediate;   Fair Recent;   Fair Remote;   Fair  Judgement:  Fair  Insight:  Fair  Psychomotor Activity:  Normal  Concentration:  Fair  Recall:  [employer] [place]  Language: Fair  Akathisia:  No  Handed:  Right  AIMS (if indicated):     Assets:  Desire for Improvement Housing Physical Health  Sleep:  Number of Hours: 6.25  Cognition: WNL  ADL's:  Intact   Mental Status Per Nursing Assessment::   On  Admission:  Suicidal ideation indicated by others  Demographic Factors:  Male, Low socioeconomic status, Living alone and Unemployed  Loss Factors: Decline in physical health  Historical Factors: NA  Risk Reduction Factors:   Religious beliefs about death  Continued Clinical Symptoms:  Dysthymia Medical Diagnoses and Treatments/Surgeries  Cognitive Features That Contribute To Risk:  None    Suicide Risk:  Minimal: No identifiable suicidal ideation.  Patients presenting with no risk factors but with morbid ruminations; may be classified as minimal risk based on the severity of the depressive symptoms  Follow-up [facility] up.   Specialty: Professional Counselor Why: Walk in hours are Monday-Friday from 8:30am-12:00pm and 1:00PM-2:30PM. Please take photo id, discharge paperwork, and list of current meds with you. Thank You! Contact information: [facility] [place] [address] [phone]           Plan Of Care/Follow-up recommendations:  Activity:  Activity as tolerated.  We have requested a walker or assistance device from physical therapy. Diet:  Patient strictly needs to stick to a diabetic diet to control his diabetes Other:  Patient has been counseled on multiple options that were available as a possible living situation.  It has been difficult for him to decide on settling on an option but at this point he has no suicidal or homicidal thought is not psychotic and does not have a psychiatric need for continued hospitalization.  Alethia Berthold, MD 04/07/2019, 9:10 AM
